# Patient Record
Sex: Female | Born: 1975 | Race: Black or African American | Hispanic: No | Smoking: Never smoker
Health system: Southern US, Community
[De-identification: ages and names within clinical notes are randomized; demographics above are authoritative.]

## PROBLEM LIST (undated history)

## (undated) DIAGNOSIS — E538 Deficiency of other specified B group vitamins: Secondary | ICD-10-CM

## (undated) DIAGNOSIS — E559 Vitamin D deficiency, unspecified: Secondary | ICD-10-CM

## (undated) DIAGNOSIS — G4700 Insomnia, unspecified: Secondary | ICD-10-CM

## (undated) DIAGNOSIS — L4050 Arthropathic psoriasis, unspecified: Secondary | ICD-10-CM

## (undated) DIAGNOSIS — G4730 Sleep apnea, unspecified: Secondary | ICD-10-CM

## (undated) DIAGNOSIS — M329 Systemic lupus erythematosus, unspecified: Secondary | ICD-10-CM

## (undated) DIAGNOSIS — I10 Essential (primary) hypertension: Secondary | ICD-10-CM

## (undated) DIAGNOSIS — E739 Lactose intolerance, unspecified: Secondary | ICD-10-CM

## (undated) DIAGNOSIS — F32A Depression, unspecified: Secondary | ICD-10-CM

## (undated) DIAGNOSIS — G8929 Other chronic pain: Secondary | ICD-10-CM

## (undated) DIAGNOSIS — F988 Other specified behavioral and emotional disorders with onset usually occurring in childhood and adolescence: Secondary | ICD-10-CM

## (undated) DIAGNOSIS — R079 Chest pain, unspecified: Secondary | ICD-10-CM

## (undated) DIAGNOSIS — R011 Cardiac murmur, unspecified: Secondary | ICD-10-CM

## (undated) DIAGNOSIS — K829 Disease of gallbladder, unspecified: Secondary | ICD-10-CM

## (undated) DIAGNOSIS — G4733 Obstructive sleep apnea (adult) (pediatric): Secondary | ICD-10-CM

## (undated) DIAGNOSIS — F329 Major depressive disorder, single episode, unspecified: Secondary | ICD-10-CM

## (undated) DIAGNOSIS — R519 Headache, unspecified: Secondary | ICD-10-CM

## (undated) DIAGNOSIS — M2550 Pain in unspecified joint: Secondary | ICD-10-CM

## (undated) DIAGNOSIS — A4902 Methicillin resistant Staphylococcus aureus infection, unspecified site: Secondary | ICD-10-CM

## (undated) DIAGNOSIS — R51 Headache: Secondary | ICD-10-CM

## (undated) DIAGNOSIS — M797 Fibromyalgia: Secondary | ICD-10-CM

## (undated) HISTORY — DX: Headache, unspecified: R51.9

## (undated) HISTORY — DX: Lactose intolerance, unspecified: E73.9

## (undated) HISTORY — DX: Other chronic pain: G89.29

## (undated) HISTORY — DX: Deficiency of other specified B group vitamins: E53.8

## (undated) HISTORY — DX: Cardiac murmur, unspecified: R01.1

## (undated) HISTORY — DX: Disease of gallbladder, unspecified: K82.9

## (undated) HISTORY — DX: Arthropathic psoriasis, unspecified: L40.50

## (undated) HISTORY — DX: Obstructive sleep apnea (adult) (pediatric): G47.33

## (undated) HISTORY — DX: Other specified behavioral and emotional disorders with onset usually occurring in childhood and adolescence: F98.8

## (undated) HISTORY — DX: Headache: R51

## (undated) HISTORY — DX: Systemic lupus erythematosus, unspecified: M32.9

## (undated) HISTORY — DX: Chest pain, unspecified: R07.9

## (undated) HISTORY — DX: Vitamin D deficiency, unspecified: E55.9

## (undated) HISTORY — DX: Pain in unspecified joint: M25.50

## (undated) HISTORY — DX: Sleep apnea, unspecified: G47.30

---

## 1996-07-01 HISTORY — PX: TUBAL LIGATION: SHX77

## 2000-12-18 ENCOUNTER — Emergency Department (HOSPITAL_COMMUNITY): Admission: EM | Admit: 2000-12-18 | Discharge: 2000-12-18 | Payer: Self-pay | Admitting: Emergency Medicine

## 2001-04-01 ENCOUNTER — Emergency Department (HOSPITAL_COMMUNITY): Admission: EM | Admit: 2001-04-01 | Discharge: 2001-04-01 | Payer: Self-pay | Admitting: Emergency Medicine

## 2001-12-28 ENCOUNTER — Other Ambulatory Visit: Admission: RE | Admit: 2001-12-28 | Discharge: 2001-12-28 | Payer: Self-pay | Admitting: Obstetrics and Gynecology

## 2002-02-15 ENCOUNTER — Encounter: Payer: Self-pay | Admitting: Emergency Medicine

## 2002-02-15 ENCOUNTER — Emergency Department (HOSPITAL_COMMUNITY): Admission: EM | Admit: 2002-02-15 | Discharge: 2002-02-15 | Payer: Self-pay | Admitting: Emergency Medicine

## 2002-04-10 ENCOUNTER — Emergency Department (HOSPITAL_COMMUNITY): Admission: EM | Admit: 2002-04-10 | Discharge: 2002-04-11 | Payer: Self-pay | Admitting: Emergency Medicine

## 2002-08-18 ENCOUNTER — Emergency Department (HOSPITAL_COMMUNITY): Admission: EM | Admit: 2002-08-18 | Discharge: 2002-08-18 | Payer: Self-pay | Admitting: Neurology

## 2002-12-05 ENCOUNTER — Emergency Department (HOSPITAL_COMMUNITY): Admission: EM | Admit: 2002-12-05 | Discharge: 2002-12-05 | Payer: Self-pay | Admitting: Emergency Medicine

## 2003-05-09 ENCOUNTER — Emergency Department (HOSPITAL_COMMUNITY): Admission: EM | Admit: 2003-05-09 | Discharge: 2003-05-09 | Payer: Self-pay | Admitting: Emergency Medicine

## 2003-07-02 HISTORY — PX: APPENDECTOMY: SHX54

## 2003-08-07 ENCOUNTER — Inpatient Hospital Stay (HOSPITAL_COMMUNITY): Admission: EM | Admit: 2003-08-07 | Discharge: 2003-08-09 | Payer: Self-pay | Admitting: Emergency Medicine

## 2003-08-08 ENCOUNTER — Encounter (INDEPENDENT_AMBULATORY_CARE_PROVIDER_SITE_OTHER): Payer: Self-pay | Admitting: *Deleted

## 2003-08-26 ENCOUNTER — Other Ambulatory Visit: Admission: RE | Admit: 2003-08-26 | Discharge: 2003-08-26 | Payer: Self-pay | Admitting: Obstetrics and Gynecology

## 2004-04-30 ENCOUNTER — Other Ambulatory Visit: Admission: RE | Admit: 2004-04-30 | Discharge: 2004-04-30 | Payer: Self-pay | Admitting: Obstetrics and Gynecology

## 2004-12-21 ENCOUNTER — Emergency Department (HOSPITAL_COMMUNITY): Admission: EM | Admit: 2004-12-21 | Discharge: 2004-12-21 | Payer: Self-pay | Admitting: *Deleted

## 2006-02-03 ENCOUNTER — Emergency Department (HOSPITAL_COMMUNITY): Admission: EM | Admit: 2006-02-03 | Discharge: 2006-02-03 | Payer: Self-pay | Admitting: Emergency Medicine

## 2006-05-21 ENCOUNTER — Emergency Department (HOSPITAL_COMMUNITY): Admission: EM | Admit: 2006-05-21 | Discharge: 2006-05-21 | Payer: Self-pay | Admitting: Emergency Medicine

## 2006-06-23 ENCOUNTER — Other Ambulatory Visit: Admission: RE | Admit: 2006-06-23 | Discharge: 2006-06-23 | Payer: Self-pay | Admitting: Internal Medicine

## 2006-09-06 ENCOUNTER — Emergency Department (HOSPITAL_COMMUNITY): Admission: EM | Admit: 2006-09-06 | Discharge: 2006-09-06 | Payer: Self-pay | Admitting: Emergency Medicine

## 2006-09-27 ENCOUNTER — Emergency Department (HOSPITAL_COMMUNITY): Admission: EM | Admit: 2006-09-27 | Discharge: 2006-09-27 | Payer: Self-pay | Admitting: Family Medicine

## 2007-06-26 ENCOUNTER — Emergency Department (HOSPITAL_COMMUNITY): Admission: EM | Admit: 2007-06-26 | Discharge: 2007-06-26 | Payer: Self-pay | Admitting: Emergency Medicine

## 2007-12-04 ENCOUNTER — Ambulatory Visit: Payer: Self-pay | Admitting: *Deleted

## 2007-12-09 ENCOUNTER — Ambulatory Visit: Payer: Self-pay | Admitting: *Deleted

## 2007-12-15 ENCOUNTER — Other Ambulatory Visit: Admission: RE | Admit: 2007-12-15 | Discharge: 2007-12-15 | Payer: Self-pay | Admitting: Obstetrics and Gynecology

## 2007-12-23 ENCOUNTER — Ambulatory Visit: Payer: Self-pay | Admitting: *Deleted

## 2007-12-30 ENCOUNTER — Ambulatory Visit: Payer: Self-pay | Admitting: *Deleted

## 2009-01-27 ENCOUNTER — Emergency Department (HOSPITAL_COMMUNITY): Admission: EM | Admit: 2009-01-27 | Discharge: 2009-01-27 | Payer: Self-pay | Admitting: Emergency Medicine

## 2009-10-04 ENCOUNTER — Emergency Department (HOSPITAL_COMMUNITY): Admission: EM | Admit: 2009-10-04 | Discharge: 2009-10-04 | Payer: Self-pay | Admitting: Emergency Medicine

## 2010-05-07 ENCOUNTER — Emergency Department (HOSPITAL_COMMUNITY): Admission: EM | Admit: 2010-05-07 | Discharge: 2010-05-07 | Payer: Self-pay | Admitting: Emergency Medicine

## 2010-09-11 LAB — CBC
HCT: 37.7 % (ref 36.0–46.0)
MCH: 29.8 pg (ref 26.0–34.0)
MCHC: 33.6 g/dL (ref 30.0–36.0)
MCV: 88.9 fL (ref 78.0–100.0)
Platelets: 341 10*3/uL (ref 150–400)
RDW: 13.3 % (ref 11.5–15.5)
WBC: 10.2 10*3/uL (ref 4.0–10.5)

## 2010-09-11 LAB — DIFFERENTIAL
Basophils Absolute: 0 10*3/uL (ref 0.0–0.1)
Eosinophils Absolute: 0.1 10*3/uL (ref 0.0–0.7)
Eosinophils Relative: 1 % (ref 0–5)
Monocytes Absolute: 0.7 10*3/uL (ref 0.1–1.0)

## 2010-09-11 LAB — PREGNANCY, URINE: Preg Test, Ur: NEGATIVE

## 2010-09-11 LAB — POCT I-STAT, CHEM 8
BUN: 15 mg/dL (ref 6–23)
Calcium, Ion: 1.16 mmol/L (ref 1.12–1.32)
Hemoglobin: 13.9 g/dL (ref 12.0–15.0)
Sodium: 138 mEq/L (ref 135–145)
TCO2: 25 mmol/L (ref 0–100)

## 2010-09-11 LAB — URINE MICROSCOPIC-ADD ON

## 2010-09-11 LAB — URINALYSIS, ROUTINE W REFLEX MICROSCOPIC
Bilirubin Urine: NEGATIVE
Nitrite: NEGATIVE
Specific Gravity, Urine: 1.028 (ref 1.005–1.030)
Urobilinogen, UA: 1 mg/dL (ref 0.0–1.0)
pH: 7 (ref 5.0–8.0)

## 2010-09-11 LAB — POCT CARDIAC MARKERS: Myoglobin, poc: 20.3 ng/mL (ref 12–200)

## 2010-09-19 LAB — POCT CARDIAC MARKERS
CKMB, poc: <1 ng/mL — ABNORMAL LOW (ref 1.0–8.0)
Myoglobin, poc: 48.3 ng/mL (ref 12–200)
Troponin i, poc: <0.05 ng/mL (ref 0.00–0.09)
Troponin i, poc: <0.05 ng/mL (ref 0.00–0.09)

## 2010-09-19 LAB — URINALYSIS, ROUTINE W REFLEX MICROSCOPIC
Bilirubin Urine: NEGATIVE
Glucose, UA: NEGATIVE mg/dL
Ketones, ur: NEGATIVE mg/dL
Nitrite: NEGATIVE
Specific Gravity, Urine: 1.021 (ref 1.005–1.030)
pH: 6.5 (ref 5.0–8.0)

## 2010-09-19 LAB — POCT I-STAT, CHEM 8
BUN: 8 mg/dL (ref 6–23)
Calcium, Ion: 1.1 mmol/L — ABNORMAL LOW (ref 1.12–1.32)
Chloride: 103 mEq/L (ref 96–112)
Glucose, Bld: 93 mg/dL (ref 70–99)
TCO2: 26 mmol/L (ref 0–100)

## 2010-09-19 LAB — URINE MICROSCOPIC-ADD ON

## 2010-09-19 LAB — D-DIMER, QUANTITATIVE: D-Dimer, Quant: 0.24 ug/mL-FEU (ref 0.00–0.48)

## 2010-10-07 LAB — URINALYSIS, ROUTINE W REFLEX MICROSCOPIC
Bilirubin Urine: NEGATIVE
Ketones, ur: NEGATIVE mg/dL
Nitrite: NEGATIVE
Protein, ur: NEGATIVE mg/dL
Urobilinogen, UA: 1 mg/dL (ref 0.0–1.0)

## 2010-10-07 LAB — POCT PREGNANCY, URINE: Preg Test, Ur: NEGATIVE

## 2010-11-16 NOTE — Op Note (Signed)
NAMELORIENE, Sandra Vang                        ACCOUNT NO.:  [id number]   MEDICAL RECORD NO.:  [id number]                   PATIENT TYPE:  INP   LOCATION:  5709                                 FACILITY:  [facility]   PHYSICIAN:  Thornton Park. Daphine Deutscher, M.D.             DATE OF BIRTH:  12-10-1975   DATE OF PROCEDURE:  08/07/2003  DATE OF DISCHARGE:                                 OPERATIVE REPORT   PREOPERATIVE DIAGNOSIS:  Acute appendicitis.   POSTOPERATIVE DIAGNOSIS:  Acute appendicitis in the pelvis with left ovarian  cyst.   OPERATION PERFORMED:   SURGEON:  Molli Hazard B. Daphine Deutscher, M.D.   ANESTHESIA:  General endotracheal.   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  There a diagnosis of acute appendicitis  was made based on her physical findings.  She had an equivocal CT scan.  Informed consent was obtained regarding the procedure and the risks.   DESCRIPTION OF PROCEDURE:  The patient was taken to room 16 at [facility] on Sunday, August 07, 2003 and given general anesthesia.  Unasyn  3 g was started in her arm and she developed a couple of small welts on her  arm.  Subsequently, we ordered some Mefoxin which is pending.  The abdomen  was prepped with Betadine and draped sterilely.  I excised her previous  transverse incision in her infraumbilical region from previous laparoscopic  surgery.  I was able to incise the fascia which had a fairly thin abdominal  wall at that location.  I used a generous pursestring suture going in with a  0 Vicryl which I subsequently used to close the small wound.  The Hasson  cannula was inserted under direct vision without difficulty.  The abdomen  was insufflated and the cecum was very floppy and was lying down in the  pelvis.  I put her in Trendelenburg, rotated her to the left and was able to  find the appendix down in the pelvis where there was some cloudy purulent  looking  fluid lying on top of her uterus.  I examined her ovaries and the  left side looked like it had a cyst that might could be about ready to  rupture and entering ovulation.  The appendix  was grasped and was easily  mobile and brought out of the pelvis and had redness and turgor associated  with early appendicitis.  The base was then dissected free and was  transected with as single application of the Endo GIA using a vascular  cartilage.  The mesentery of the appendix was transected using the Harmonic  scalpel.  The appendix was placed into a bag and brought out through the  umbilicus.  I reinserted the Hasson and injected all the port sites with  Marcaine. We surveyed the pelvis and again saw no evidence of bleeding from  the appendiceal mesentery and  the staple line looked good and clean on the  cecum.  The umbilical port was tied down under laparoscopic vision and the  abdomen reinflated and looked around and no other abnormalities were  identified.  The abdomen was deflated as the trocars were removed.  The skin  was closed with 4-0 Vicryl with benzoin and Steri-Strips.  The patient  seemed to tolerate the procedure well and was taken to the recovery room in  satisfactory condition.                                               Thornton Park Daphine Deutscher, M.D.    MBM/MEDQ  D:  08/07/2003  T:  08/08/2003  Job:  [id number]

## 2010-11-16 NOTE — Discharge Summary (Signed)
[name], Sandra Vang                        ACCOUNT NO.:  [id number]   MEDICAL RECORD NO.:  [id number]                   PATIENT TYPE:  INP   LOCATION:  5709                                 FACILITY:  [facility]   PHYSICIAN:  Thornton Park. Daphine Deutscher, M.D.             DATE OF BIRTH:  [birth year]   DATE OF ADMISSION:  08/07/2003  DATE OF DISCHARGE:  08/09/2003                                 DISCHARGE SUMMARY   ADMISSION DIAGNOSIS:  Acute appendicitis.   PROCEDURE:  Laparoscopic appendectomy.   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   HOSPITAL COURSE:  She underwent a laparoscopic appendectomy on the afternoon  of August 07, 2003. She was not ready to go home on the afternoon of  August 08, 2003, and was kept overnight until August 09, 2003.   At that time her white count had normalized. She was feeling better, passing  gas and ready to go home. She was discharged to return to the office in 2  weeks. Forms were filled out for her employer which is [employer].   CONDITION ON DISCHARGE:  Good.                                                Thornton Park Daphine Deutscher, M.D.    MBM/MEDQ  D:  08/09/2003  T:  08/09/2003  Job:  [id number]

## 2010-11-16 NOTE — H&P (Signed)
Sandra Vang, Sandra Vang                        ACCOUNT NO.:  [id number]   MEDICAL RECORD NO.:  [id number]                   PATIENT TYPE:  INP   LOCATION:  5709                                 FACILITY:  [facility]   PHYSICIAN:  Thornton Park. Daphine Deutscher, M.D.             DATE OF BIRTH:  Feb 01, 1976   DATE OF ADMISSION:  08/07/2003  DATE OF DISCHARGE:                                HISTORY & PHYSICAL   CHIEF COMPLAINT:  Lower abdominal pain since early this morning.   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  This was severe and somewhat was associated with nausea  and subsequent vomiting.  She was brought to the emergency room, where she  was checked in around 4:20, and began workup by Dr. __________ .  She denied  any diarrhea or ever having any pain like this in the past.  Her last  menstrual period was in the latter part of January and was normal.  She sees  an OB/GYN who treated her for some vaginosis with some Flagyl gel but no  antibiotics.   ALLERGIES:  The patient has allergies to IV CONTRAST MATERIAL, which  produces hives, and she says CODEINE produces some itching.   MEDICINES:  Medicines include previously used Zyrtec and she has used the  Flagyl gel.   PAST MEDICAL HISTORY:  She has had two prior C-sections.  She has also had a  right kidney cyst for which she sees Dr. Dennison Nancy. Kimbrough.   HABITS:  She is a nonsmoker, nondrinker, non-drug user.   FAMILY HISTORY:  Family history is noncontributory.   REVIEW OF SYSTEMS:  Review of systems negative for pulmonary, cardiac,  previous GI, immune problems or bleeding dyscrasias.   SOCIAL HISTORY:  The patient is a Engineer, civil (consulting) at [employer].   PHYSICAL EXAMINATION:  VITAL SIGNS:  Blood pressure 109/59, pulse 78,  respirations 16.  HEENT:  Head:  Normocephalic.  Eyes:  Sclerae nonicteric.  Pupils equal,  round and reactive to light.  Nose and throat:  Throat has got dry mucous  membranes and chronic tonsillitis.  NECK:  No thyromegaly or adenopathy.  CHEST:  Chest is clear to auscultation.  HEART:  Sinus rhythm without murmurs or gallops noted.  ABDOMEN:  There are scant bowel sounds but she is tender on the right more  than left with guarding.  She has exquisite pain with coughing and does not  do this.  She has got some element of rebound tenderness.  EXTREMITY EXAM:  Full range of motion.   LABORATORY DATA:  Laboratory drawn at 5 o'clock this morning showed white  count of 10,300 with 85% neutrophils and absolute granulocytes elevated at  8.7%.  BMET is  unremarkable.  Hemoglobin is 13.6 with normal electrolytes.  Urinalysis showed a few white cells and this may have been a clean-catch  urine.   ASSESSMENT:  We discussed various approaches to managing this.  I did review  the CT scan with Dr. Rolan Bucco L. [place] and because there is no intravenous  contrast and because the oral contrast only got into the cecum, some parts  of the pelvic exam are not visualized as well as if we were able to give her  intravenous contrast.  However, we did not see any evidence of free fluid.  In the area of her appendix, there is no appendix seen.  He is not able to  rule out appendicitis on the CT scan.   Because of her tenderness, we discussed options including admission into  observation, or a laparoscopy with appendectomy and a pelvic examination.  Because of her tenderness and her physical findings that would point more  toward appendicitis, she probably wants to pursue appendectomy and I feel  like we will proceed with laparoscopic appendectomy.   PLAN:  Laparoscopic appendectomy.  We will request 3 g of Unasyn IV.  Permit  was obtained by me in the ED including risks, benefits, potential  complications.  Plan to OR when OR  available.                                                Thornton Park Daphine Deutscher, M.D.    MBM/MEDQ  D:  08/07/2003  T:  08/08/2003  Job:  [id number]

## 2010-12-08 ENCOUNTER — Inpatient Hospital Stay (INDEPENDENT_AMBULATORY_CARE_PROVIDER_SITE_OTHER)
Admission: RE | Admit: 2010-12-08 | Discharge: 2010-12-08 | Disposition: A | Discharge status: Home / Self Care | Payer: Managed Care, Other (non HMO) | Source: Ambulatory Visit | Attending: Emergency Medicine | Admitting: Emergency Medicine

## 2010-12-08 DIAGNOSIS — L0231 Cutaneous abscess of buttock: Secondary | ICD-10-CM

## 2010-12-11 LAB — CULTURE, ROUTINE-ABSCESS

## 2011-04-05 LAB — POCT URINALYSIS DIP (DEVICE)
Nitrite: POSITIVE — AB
Protein, ur: >=300 — AB
pH: 5.5

## 2011-04-05 LAB — POCT PREGNANCY, URINE: Preg Test, Ur: NEGATIVE

## 2011-07-09 ENCOUNTER — Emergency Department (HOSPITAL_COMMUNITY)
Admission: EM | Admit: 2011-07-09 | Discharge: 2011-07-10 | Disposition: A | Discharge status: Home / Self Care | Payer: Managed Care, Other (non HMO) | Attending: Emergency Medicine | Admitting: Emergency Medicine

## 2011-07-09 ENCOUNTER — Other Ambulatory Visit: Payer: Self-pay

## 2011-07-09 ENCOUNTER — Encounter: Payer: Self-pay | Admitting: Emergency Medicine

## 2011-07-09 DIAGNOSIS — R5381 Other malaise: Secondary | ICD-10-CM | POA: Insufficient documentation

## 2011-07-09 DIAGNOSIS — R5383 Other fatigue: Secondary | ICD-10-CM

## 2011-07-09 DIAGNOSIS — F3289 Other specified depressive episodes: Secondary | ICD-10-CM | POA: Insufficient documentation

## 2011-07-09 DIAGNOSIS — F329 Major depressive disorder, single episode, unspecified: Secondary | ICD-10-CM | POA: Insufficient documentation

## 2011-07-09 DIAGNOSIS — Z79899 Other long term (current) drug therapy: Secondary | ICD-10-CM | POA: Insufficient documentation

## 2011-07-09 DIAGNOSIS — M7989 Other specified soft tissue disorders: Secondary | ICD-10-CM | POA: Insufficient documentation

## 2011-07-09 HISTORY — DX: Insomnia, unspecified: G47.00

## 2011-07-09 HISTORY — DX: Fibromyalgia: M79.7

## 2011-07-09 HISTORY — DX: Depression, unspecified: F32.A

## 2011-07-09 HISTORY — DX: Major depressive disorder, single episode, unspecified: F32.9

## 2011-07-09 NOTE — ED Notes (Signed)
Pt alert, nad, c/o swelling to feet and ankle, high b/p per [facility], pt resp even unlabored skin pwd, c/o feeling tired

## 2011-07-10 LAB — URINALYSIS, ROUTINE W REFLEX MICROSCOPIC
Bilirubin Urine: NEGATIVE
Hgb urine dipstick: NEGATIVE
Ketones, ur: NEGATIVE mg/dL
Specific Gravity, Urine: 1.018 (ref 1.005–1.030)
Urobilinogen, UA: 0.2 mg/dL (ref 0.0–1.0)
pH: 6 (ref 5.0–8.0)

## 2011-07-10 LAB — URINE CULTURE
Colony Count: 85000
Culture  Setup Time: 201301090908

## 2011-07-10 LAB — POCT I-STAT, CHEM 8
Calcium, Ion: 1.11 mmol/L — ABNORMAL LOW (ref 1.12–1.32)
Chloride: 104 mEq/L (ref 96–112)
HCT: 39 % (ref 36.0–46.0)
Hemoglobin: 13.3 g/dL (ref 12.0–15.0)
TCO2: 24 mmol/L (ref 0–100)

## 2011-07-10 LAB — URINE MICROSCOPIC-ADD ON

## 2011-07-10 NOTE — ED Provider Notes (Signed)
History     CSN: [id number]  Arrival date & time 07/09/11  2238   First MD Initiated Contact with Patient 07/10/11 0234      Chief Complaint  Patient presents with  . Facial Swelling    also to ankles, feet and hands.  . Hypertension    ton[facility] 147/[facility]    (Consider location/radiation/quality/duration/timing/severity/associated sxs/prior treatment) HPI Comments: 36 year old female with a history of fibromyalgia presents with approximately 5 days of swelling of the legs specifically the ankles and feet as well as mild swelling of the face. This is the first time she has ever had this, it is mild and has improved over the last 4 days. She denies urinary symptoms, cough fever chills nausea vomiting abdominal pain chest pain sore throat nasal congestion or headache. There is no focal weakness but the patient does feel increasing fatigue over the last several days. She denies being pregnant has no dysuria rash diarrhea or fevers. Symptoms are now mild, persistent, gradually improving. He has not started any new medications recently  Patient is a 36 y.o. female presenting with hypertension. The history is provided by the patient.  Hypertension    Past Medical History  Diagnosis Date  . Fibromyalgia   . Depression   . Insomnia     Past Surgical History  Procedure Date  . Appendectomy   . Cesarean section   . Tubal ligation     No family history on file.  History  Substance Use Topics  . Smoking status: Not on file  . Smokeless tobacco: Not on file  . Alcohol Use:     OB History    Grav Para Term Preterm Abortions TAB SAB Ect Mult Living                  Review of Systems  All other systems reviewed and are negative.    Allergies  Iohexol and Sulfa antibiotics  Home Medications   Current Outpatient Rx  Name Route Sig Dispense Refill  . CITALOPRAM HYDROBROMIDE 20 MG PO TABS Oral Take 20 mg by mouth daily.      [name] ZOLPIDEM TARTRATE 10 MG PO TABS Oral Take  10 mg by mouth at bedtime as needed.        BP 148/85  Pulse 70  Temp 98 F (36.7 C)  Resp 16  Wt 80 lb (36.288 kg)  SpO2 99%  LMP 07/02/2011  Physical Exam  Nursing note and vitals reviewed. Constitutional: She appears well-developed and well-nourished. No distress.  HENT:  Head: Normocephalic and atraumatic.  Mouth/Throat: Oropharynx is clear and moist. No oropharyngeal exudate.  Eyes: Conjunctivae and EOM are normal. Pupils are equal, round, and reactive to light. Right eye exhibits no discharge. Left eye exhibits no discharge. No scleral icterus.  Neck: Normal range of motion. Neck supple. No JVD present. No thyromegaly present.  Cardiovascular: Normal rate, regular rhythm, normal heart sounds and intact distal pulses.  Exam reveals no gallop and no friction rub.   No murmur heard. Pulmonary/Chest: Effort normal and breath sounds normal. No respiratory distress. She has no wheezes. She has no rales.  Abdominal: Soft. Bowel sounds are normal. She exhibits no distension and no mass. There is no tenderness.  Musculoskeletal: Normal range of motion. She exhibits no tenderness. Edema:  Scant bilateral lower extremity edema at the ankles. Normal pulses at the feet.  Lymphadenopathy:    She has no cervical adenopathy.  Neurological: She is alert. Coordination normal.  Skin:  Skin is warm and dry. No rash noted. No erythema.  Psychiatric: She has a normal mood and affect. Her behavior is normal.    ED Course  Procedures (including critical care time)  Labs Reviewed  URINALYSIS, ROUTINE W REFLEX MICROSCOPIC - Abnormal; Notable for the following:    APPearance CLOUDY (*)    Leukocytes, UA MODERATE (*)    All other components within normal limits  POCT I-STAT, CHEM 8 - Abnormal; Notable for the following:    Calcium, Ion 1.11 (*)    All other components within normal limits  URINE MICROSCOPIC-ADD ON - Abnormal; Notable for the following:    Squamous Epithelial / LPF MANY (*)     Bacteria, UA FEW (*)    All other components within normal limits  PREGNANCY, URINE  POCT I-STAT TROPONIN I  I-STAT TROPONIN I  I-STAT, CHEM 8  URINE CULTURE   No results found.   1. Swelling of lower limb   2. Fatigue       MDM  Patient appears well, no appreciable facial swelling, scant lower extremity edema. She states that there is a component of worsening today better at night when her feet are up. We'll check urinalysis for proteinuria, chemistry for hypokalemia as a possible source of her generalized fatigue. There is no focal neurologic symptoms on my exam or signs of neurologic deficit. EKG is normal showing no abnormalities as ordered by the nurse in triage. Patient declines fluids when offered.  ED ECG REPORT   Date: 07/10/2011   Rate: 64  Rhythm: normal sinus rhythm  QRS Axis: normal  Intervals: normal  ST/T Wave abnormalities: normal  Conduction Disutrbances:none  Narrative Interpretation:   Old EKG Reviewed: Unchanged compared to 05/07/2010   Lab results reviewed and shows no anemia, urinalysis with many squamous cells but no white blood cells and few bacteria, urine culture sent. Urine pregnancy negative, troponin negative and EKG unremarkable. Will discharge patient for close followup. There is no obvious abnormality seen on today's physical exam nor laboratory workup. She does not have proteinuria and has a average specific gravity suggesting no significant dehydration.      Vida Roller, MD 07/10/11 [phone]

## 2011-11-04 ENCOUNTER — Ambulatory Visit (INDEPENDENT_AMBULATORY_CARE_PROVIDER_SITE_OTHER): Payer: Managed Care, Other (non HMO) | Admitting: Family

## 2011-11-04 ENCOUNTER — Encounter: Payer: Self-pay | Admitting: Family

## 2011-11-04 VITALS — BP 100/80 | Ht 61.0 in | Wt 182.0 lb

## 2011-11-04 DIAGNOSIS — R609 Edema, unspecified: Secondary | ICD-10-CM

## 2011-11-04 DIAGNOSIS — E669 Obesity, unspecified: Secondary | ICD-10-CM

## 2011-11-04 DIAGNOSIS — Z0000 Encounter for general adult medical examination without abnormal findings: Secondary | ICD-10-CM

## 2011-11-04 LAB — CBC WITH DIFFERENTIAL/PLATELET
Basophils Relative: 0.3 % (ref 0.0–3.0)
Eosinophils Absolute: 0 10*3/uL (ref 0.0–0.7)
Eosinophils Relative: 0.6 % (ref 0.0–5.0)
Lymphocytes Relative: 24.2 % (ref 12.0–46.0)
MCHC: 32.6 g/dL (ref 30.0–36.0)
Neutrophils Relative %: 69 % (ref 43.0–77.0)
Platelets: 318 10*3/uL (ref 150.0–400.0)
RBC: 4.23 Mil/uL (ref 3.87–5.11)
WBC: 7.8 10*3/uL (ref 4.5–10.5)

## 2011-11-04 LAB — BASIC METABOLIC PANEL
Calcium: 8.9 mg/dL (ref 8.4–10.5)
GFR: 134.96 mL/min (ref >60.00)
Sodium: 140 mEq/L (ref 135–145)

## 2011-11-04 LAB — LIPID PANEL
Cholesterol: 178 mg/dL (ref 0–200)
HDL: 76.3 mg/dL (ref >39.00)
Total CHOL/HDL Ratio: 2
Triglycerides: 58 mg/dL (ref 0.0–149.0)

## 2011-11-04 LAB — POCT URINALYSIS DIPSTICK
Bilirubin, UA: NEGATIVE
Blood, UA: NEGATIVE
Nitrite, UA: NEGATIVE
Spec Grav, UA: 1.02
pH, UA: 7

## 2011-11-04 MED ORDER — FUROSEMIDE 20 MG PO TABS: 20.0000 mg | ORAL_TABLET | Freq: Every day | ORAL | Status: DC

## 2011-11-04 NOTE — Progress Notes (Signed)
Subjective:    Patient ID: Sandra Vang, female    DOB: Jun 09, 1976, 36 y.o.   MRN: [id number]  HPI This is a routine physical examination for this healthy  Female. Reviewed all health maintenance protocols including reviewed appropriate screening labs. Her immunization history was reviewed as well as her current medications and allergies refills of her chronic medications were given and the plan for yearly health maintenance was discussed all orders and referrals were made as appropriate.  Patient has concerns of edema to both lower extremities that occurs periodically. She was seen at the emergency department for evaluation of his several months ago, was given Lasix and improved her symptoms. She never found out the etiology of her edema. She denies any lightheadedness, dizziness, chest pain, palpitations, shortness of breath or edema.  Review of Systems  Constitutional: Negative.   HENT: Negative.   Eyes: Negative.   Respiratory: Negative.   Cardiovascular: Positive for leg swelling. Negative for chest pain and palpitations.  Gastrointestinal: Negative.   Genitourinary: Negative.   Musculoskeletal: Negative.   Skin: Negative.   Neurological: Negative.   Hematological: Negative.   Psychiatric/Behavioral: Negative.    Past Medical History  Diagnosis Date  . Fibromyalgia   . Depression   . Insomnia     History   Social History  . Marital Status: Married    Spouse Name: N/A    Number of Children: N/A  . Years of Education: N/A   Occupational History  . Not on file.   Social History Main Topics  . Smoking status: Never Smoker   . Smokeless tobacco: Not on file  . Alcohol Use: No  . Drug Use: No  . Sexually Active: Not on file   Other Topics Concern  . Not on file   Social History Narrative  . No narrative on file    Past Surgical History  Procedure Date  . Appendectomy   . Cesarean section   . Tubal ligation     Family History  Problem Relation Age  of Onset  . Alcohol abuse Mother   . Hypertension Mother   . Drug abuse Father   . Hypertension Father   . Depression Paternal Aunt   . Depression Paternal Uncle   . Alcohol abuse Maternal Grandmother   . Arthritis Maternal Grandmother   . Cancer Maternal Grandmother   . Hypertension Maternal Grandmother   . Alcohol abuse Maternal Grandfather   . Hypertension Maternal Grandfather   . Arthritis Paternal Grandmother   . Cancer Paternal Grandmother   . Hypertension Paternal Grandmother   . Hypertension Paternal Grandfather     Allergies  Allergen Reactions  . Iohexol      Desc: HIVES   . Sulfa Antibiotics     Current Outpatient Prescriptions on File Prior to Visit  Medication Sig Dispense Refill  . citalopram (CELEXA) 20 MG tablet Take 20 mg by mouth daily.        [name] zolpidem (AMBIEN) 10 MG tablet Take 10 mg by mouth at bedtime as needed.        . furosemide (LASIX) 20 MG tablet Take 1 tablet (20 mg total) by mouth daily.  30 tablet  0    BP 100/80  Ht 5\' 1"  (1.549 m)  Wt 182 lb (82.555 kg)  BMI 34.39 kg/m2  LMP 04/01/2013chart    Objective:   Physical Exam  Constitutional: She is oriented to person, place, and time. She appears well-developed and well-nourished.  HENT:  Head: Normocephalic  and atraumatic.  Right Ear: External ear normal.  Left Ear: External ear normal.  Nose: Nose normal.  Mouth/Throat: Oropharynx is clear and moist.  Eyes: Conjunctivae and EOM are normal. Pupils are equal, round, and reactive to light.  Neck: Normal range of motion. Neck supple.  Cardiovascular: Normal rate, regular rhythm and normal heart sounds.   Pulmonary/Chest: Effort normal and breath sounds normal.  Abdominal: Soft. Bowel sounds are normal.  Genitourinary:       Deferred to GYN  Musculoskeletal: Normal range of motion.  Neurological: She is alert and oriented to person, place, and time. She has normal reflexes.  Skin: Skin is warm and dry.  Psychiatric: She has a normal  mood and affect.          Assessment & Plan:  Assessment: Complete physical exam, obesity, edema-peripheral likely related to sodium intake  Plan: Lab sent to include BMP, CBC, lipids, TSH notify patient pending results. Patient's Pap smear is up-to-date, reports having that done at a health screening at work Lasix 20 mg once a day #30 with no refills. We'll followup with patient in about a week and sooner when necessary.

## 2011-11-04 NOTE — Patient Instructions (Signed)
Peripheral Edema You have swelling in your legs (peripheral edema). This swelling is due to excess accumulation of salt and water in your body. Edema may be a sign of heart, kidney or liver disease, or a side effect of a medication. It may also be due to problems in the leg veins. Elevating your legs and using special support stockings may be very helpful, if the cause of the swelling is due to poor venous circulation. Avoid long periods of standing, whatever the cause. Treatment of edema depends on identifying the cause. Chips, pretzels, pickles and other salty foods should be avoided. Restricting salt in your diet is almost always needed. Water pills (diuretics) are often used to remove the excess salt and water from your body via urine. These medicines prevent the kidney from reabsorbing sodium. This increases urine flow. Diuretic treatment may also result in lowering of potassium levels in your body. Potassium supplements may be needed if you have to use diuretics daily. Daily weights can help you keep track of your progress in clearing your edema. You should call your caregiver for follow up care as recommended. SEEK IMMEDIATE MEDICAL CARE IF:   You have increased swelling, pain, redness, or heat in your legs.   You develop shortness of breath, especially when lying down.   You develop chest or abdominal pain, weakness, or fainting.   You have a fever.  Document Released: 07/25/2004 Document Revised: 06/06/2011 Document Reviewed: 07/05/2009 [employer] Patient Information 2012 [employer]. 

## 2011-11-05 NOTE — Progress Notes (Signed)
Quick Note:  Pt aware labs normal ______ 

## 2011-11-20 ENCOUNTER — Encounter: Payer: Self-pay | Admitting: Family

## 2011-11-20 ENCOUNTER — Ambulatory Visit (INDEPENDENT_AMBULATORY_CARE_PROVIDER_SITE_OTHER): Payer: Managed Care, Other (non HMO) | Admitting: Family

## 2011-11-20 VITALS — BP 140/100 | Temp 98.1°F | Wt 182.0 lb

## 2011-11-20 DIAGNOSIS — I10 Essential (primary) hypertension: Secondary | ICD-10-CM

## 2011-11-20 DIAGNOSIS — E669 Obesity, unspecified: Secondary | ICD-10-CM

## 2011-11-20 MED ORDER — LISINOPRIL 10 MG PO TABS: 10.0000 mg | ORAL_TABLET | Freq: Every day | ORAL | Status: DC

## 2011-11-20 NOTE — Patient Instructions (Signed)

## 2011-11-20 NOTE — Progress Notes (Signed)
  Subjective:    Patient ID: Sandra Vang, female    DOB: 08-16-1975, 36 y.o.   MRN: [id number]  HPI 36 year old [place] American female, nonsmoker, presents today with concerns about recent weight gain and elevated blood pressure. 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 Over the past month she has made lifestyle changes including eating more lean proteins, fruits, and vegetables. She has also increased her level of exercise going to the gym 3 times a week and performs both cardio and weight training exercises. Her blood pressure has also been elevated recently and she is concerned due to her family history of cardiac disease (mother and father).    Review of Systems  Constitutional: Positive for unexpected weight change (gain of about 20 lbs in the past 6 months). Negative for appetite change and fatigue.  HENT: Negative.   Eyes: Negative.   Respiratory: Negative.   Cardiovascular: Negative.   Gastrointestinal: Negative.   Genitourinary: Negative.   Musculoskeletal: Negative.   Skin: Negative.   Neurological: Negative.   Hematological: Negative.   Psychiatric/Behavioral: Negative.        Objective:   Physical Exam  Constitutional: She is oriented to person, place, and time. She appears well-developed and well-nourished.  Cardiovascular: Normal rate, regular rhythm and normal heart sounds.   Pulmonary/Chest: Effort normal and breath sounds normal.  Abdominal: Soft.  Neurological: She is alert and oriented to person, place, and time.  Skin: Skin is warm and dry.  Psychiatric: She has a normal mood and affect. Her behavior is normal.          Assessment & Plan:  Assessment: Obesity, Elevated Blood Pressure  Plan: Start Lisinopril 10 mg daily for blood pressure. Encouraged her to continue with her lifestyle changes. Will return in about 3 weeks for recheck of blood pressure and as needed.

## 2011-12-11 ENCOUNTER — Ambulatory Visit: Payer: Managed Care, Other (non HMO) | Admitting: Family

## 2011-12-20 ENCOUNTER — Ambulatory Visit (INDEPENDENT_AMBULATORY_CARE_PROVIDER_SITE_OTHER): Payer: Managed Care, Other (non HMO) | Admitting: Family

## 2011-12-20 ENCOUNTER — Encounter: Payer: Self-pay | Admitting: Family

## 2011-12-20 VITALS — BP 118/80 | Temp 98.4°F | Wt 185.0 lb

## 2011-12-20 DIAGNOSIS — I10 Essential (primary) hypertension: Secondary | ICD-10-CM

## 2011-12-20 DIAGNOSIS — E669 Obesity, unspecified: Secondary | ICD-10-CM

## 2011-12-20 MED ORDER — PHENTERMINE HCL 37.5 MG PO TABS: 37.5000 mg | ORAL_TABLET | Freq: Every day | ORAL | Status: DC

## 2011-12-20 NOTE — Progress Notes (Signed)
Subjective:    Patient ID: Sandra Vang, female    DOB: 1976-01-18, 36 y.o.   MRN: [id number]  HPI 36 year old [place] American female, nonsmoker is in for recheck of hypertension and obesity. She's currently taking lisinopril 10 mg once daily and tolerating it well. She's had a 3 pound weight gain since her last office visit. She reports a decrease in exercise since that time and is increase to caloric consumption. However, she is planning to change that now. She denies any lightheadedness, dizziness, chest pain, palpitations, shortness of breath or edema.   Review of Systems  Constitutional: Negative.   HENT: Negative.   Respiratory: Negative.   Cardiovascular: Negative.   Gastrointestinal: Negative.   Musculoskeletal: Negative.   Skin: Negative.   Neurological: Negative.   Hematological: Negative.   Psychiatric/Behavioral: Negative.    Past Medical History  Diagnosis Date  . Fibromyalgia   . Depression   . Insomnia     History   Social History  . Marital Status: Married    Spouse Name: N/A    Number of Children: N/A  . Years of Education: N/A   Occupational History  . Not on file.   Social History Main Topics  . Smoking status: Never Smoker   . Smokeless tobacco: Not on file  . Alcohol Use: No  . Drug Use: No  . Sexually Active: Not on file   Other Topics Concern  . Not on file   Social History Narrative  . No narrative on file    Past Surgical History  Procedure Date  . Appendectomy   . Cesarean section   . Tubal ligation     Family History  Problem Relation Age of Onset  . Alcohol abuse Mother   . Hypertension Mother   . Drug abuse Father   . Hypertension Father   . Depression Paternal Aunt   . Depression Paternal Uncle   . Alcohol abuse Maternal Grandmother   . Arthritis Maternal Grandmother   . Cancer Maternal Grandmother   . Hypertension Maternal Grandmother   . Alcohol abuse Maternal Grandfather   . Hypertension Maternal  Grandfather   . Arthritis Paternal Grandmother   . Cancer Paternal Grandmother   . Hypertension Paternal Grandmother   . Hypertension Paternal Grandfather     Allergies  Allergen Reactions  . Iohexol      Desc: HIVES   . Sulfa Antibiotics     Current Outpatient Prescriptions on File Prior to Visit  Medication Sig Dispense Refill  . citalopram (CELEXA) 20 MG tablet Take 20 mg by mouth daily.        [name] lisinopril (PRINIVIL,ZESTRIL) 10 MG tablet Take 1 tablet (10 mg total) by mouth daily.  30 tablet  3  . zolpidem (AMBIEN) 10 MG tablet Take 10 mg by mouth at bedtime as needed.        . phentermine (ADIPEX-P) 37.5 MG tablet Take 1 tablet (37.5 mg total) by mouth daily before breakfast.  30 tablet  0    BP 118/80  Temp 98.4 F (36.9 C) (Oral)  Wt 185 lb (83.915 kg)chart    Objective:   Physical Exam  Constitutional: She is oriented to person, place, and time. She appears well-developed and well-nourished.  HENT:  Right Ear: External ear normal.  Left Ear: External ear normal.  Nose: Nose normal.  Mouth/Throat: Oropharynx is clear and moist.  Neck: Normal range of motion. Neck supple.  Cardiovascular: Normal rate, regular rhythm and normal heart sounds.  Pulmonary/Chest: Effort normal and breath sounds normal.  Musculoskeletal: Normal range of motion.  Neurological: She is alert and oriented to person, place, and time.  Skin: Skin is warm and dry.  Psychiatric: She has a normal mood and affect.          Assessment & Plan:  Assessment: Hypertension, obesity  Plan: Strongly encouraged healthy diet and exercise. We'll start Adipex 37.5 mg once daily. Patient is aware that if she does not lose weight we will not continue to prescribe Adipex. I will bring her back for recheck in one month and determine her risk benefit ratio at that time with regard to the medication. Continue lisinopril.

## 2011-12-20 NOTE — Patient Instructions (Signed)

## 2012-01-17 ENCOUNTER — Ambulatory Visit: Payer: Managed Care, Other (non HMO) | Admitting: Family

## 2012-01-17 DIAGNOSIS — Z0289 Encounter for other administrative examinations: Secondary | ICD-10-CM

## 2012-01-27 ENCOUNTER — Encounter: Payer: Self-pay | Admitting: Family

## 2012-01-27 ENCOUNTER — Ambulatory Visit (INDEPENDENT_AMBULATORY_CARE_PROVIDER_SITE_OTHER): Payer: Managed Care, Other (non HMO) | Admitting: Family

## 2012-01-27 VITALS — BP 118/80 | HR 111 | Temp 99.3°F | Wt 180.0 lb

## 2012-01-27 DIAGNOSIS — N3090 Cystitis, unspecified without hematuria: Secondary | ICD-10-CM

## 2012-01-27 DIAGNOSIS — N390 Urinary tract infection, site not specified: Secondary | ICD-10-CM

## 2012-01-27 LAB — POCT URINALYSIS DIPSTICK
Bilirubin, UA: NEGATIVE
Glucose, UA: NEGATIVE
Ketones, UA: NEGATIVE
Spec Grav, UA: >=1.03
Urobilinogen, UA: 0.2

## 2012-01-27 MED ORDER — CIPROFLOXACIN HCL 250 MG PO TABS: 250.0000 mg | ORAL_TABLET | Freq: Two times a day (BID) | ORAL | Status: AC

## 2012-01-27 NOTE — Patient Instructions (Signed)

## 2012-01-27 NOTE — Progress Notes (Signed)
Subjective:    Patient ID: Sandra Vang, female    DOB: May 06, 1976, 36 y.o.   MRN: [id number]  HPI 36 year old African American female, nonsmoker, is in with complaints of urinary frequency, urgency, burning with urination, blood in her urine. She has not taken any medication for relief. She is sexually active with one partner. Denies any concerns any sexually transmitted diseases.   Review of Systems  Constitutional: Negative.   Respiratory: Negative.   Cardiovascular: Negative.   Gastrointestinal: Positive for abdominal pain.  Genitourinary: Positive for dysuria, urgency and frequency.  Psychiatric/Behavioral: Negative.    Past Medical History  Diagnosis Date  . Fibromyalgia   . Depression   . Insomnia     History   Social History  . Marital Status: Married    Spouse Name: N/A    Number of Children: N/A  . Years of Education: N/A   Occupational History  . Not on file.   Social History Main Topics  . Smoking status: Never Smoker   . Smokeless tobacco: Not on file  . Alcohol Use: No  . Drug Use: No  . Sexually Active: Not on file   Other Topics Concern  . Not on file   Social History Narrative  . No narrative on file    Past Surgical History  Procedure Date  . Appendectomy   . Cesarean section   . Tubal ligation     Family History  Problem Relation Age of Onset  . Alcohol abuse Mother   . Hypertension Mother   . Drug abuse Father   . Hypertension Father   . Depression Paternal Aunt   . Depression Paternal Uncle   . Alcohol abuse Maternal Grandmother   . Arthritis Maternal Grandmother   . Cancer Maternal Grandmother   . Hypertension Maternal Grandmother   . Alcohol abuse Maternal Grandfather   . Hypertension Maternal Grandfather   . Arthritis Paternal Grandmother   . Cancer Paternal Grandmother   . Hypertension Paternal Grandmother   . Hypertension Paternal Grandfather     Allergies  Allergen Reactions  . Iohexol      Desc: HIVES   . Sulfa Antibiotics     Current Outpatient Prescriptions on File Prior to Visit  Medication Sig Dispense Refill  . lisinopril (PRINIVIL,ZESTRIL) 10 MG tablet Take 1 tablet (10 mg total) by mouth daily.  30 tablet  3  . citalopram (CELEXA) 20 MG tablet Take 20 mg by mouth daily.        . phentermine (ADIPEX-P) 37.5 MG tablet Take 1 tablet (37.5 mg total) by mouth daily before breakfast.  30 tablet  0  . zolpidem (AMBIEN) 10 MG tablet Take 10 mg by mouth at bedtime as needed.          BP 118/80  Pulse 111  Temp 99.3 F (37.4 C) (Oral)  Wt 180 lb (81.647 kg)  SpO2 98%chart    Objective:   Physical Exam  Constitutional: She is oriented to person, place, and time. She appears well-developed and well-nourished.  Cardiovascular: Normal rate, regular rhythm and normal heart sounds.   Pulmonary/Chest: Effort normal and breath sounds normal.  Abdominal: Soft. Bowel sounds are normal.  Neurological: She is alert and oriented to person, place, and time.  Skin: Skin is warm and dry.  Psychiatric: She has a normal mood and affect.          Assessment & Plan:  Assessment: Urinary tract infection, dysuria, urinary frequency  Plan: Cipro 500 mg one  tablet twice a day x5 days. Increase intake of water. Avoid caffeine. Call the office if symptoms worsen or persist.

## 2012-03-18 ENCOUNTER — Other Ambulatory Visit: Payer: Self-pay

## 2012-03-18 MED ORDER — LISINOPRIL 10 MG PO TABS: 10.0000 mg | ORAL_TABLET | Freq: Every day | ORAL | Status: DC

## 2012-04-12 ENCOUNTER — Encounter (HOSPITAL_COMMUNITY): Payer: Self-pay | Admitting: *Deleted

## 2012-04-12 ENCOUNTER — Emergency Department (INDEPENDENT_AMBULATORY_CARE_PROVIDER_SITE_OTHER)
Admission: EM | Admit: 2012-04-12 | Discharge: 2012-04-12 | Disposition: A | Discharge status: Home / Self Care | Payer: Managed Care, Other (non HMO) | Source: Home / Self Care

## 2012-04-12 DIAGNOSIS — H9209 Otalgia, unspecified ear: Secondary | ICD-10-CM

## 2012-04-12 DIAGNOSIS — K089 Disorder of teeth and supporting structures, unspecified: Secondary | ICD-10-CM

## 2012-04-12 DIAGNOSIS — R609 Edema, unspecified: Secondary | ICD-10-CM

## 2012-04-12 DIAGNOSIS — K047 Periapical abscess without sinus: Secondary | ICD-10-CM

## 2012-04-12 DIAGNOSIS — K0889 Other specified disorders of teeth and supporting structures: Secondary | ICD-10-CM

## 2012-04-12 HISTORY — DX: Methicillin resistant Staphylococcus aureus infection, unspecified site: A49.02

## 2012-04-12 HISTORY — DX: Essential (primary) hypertension: I10

## 2012-04-12 MED ORDER — TRAMADOL HCL 50 MG PO TABS: 50.0000 mg | ORAL_TABLET | Freq: Four times a day (QID) | ORAL | Status: DC | PRN

## 2012-04-12 MED ORDER — IBUPROFEN 600 MG PO TABS: 600.0000 mg | ORAL_TABLET | Freq: Four times a day (QID) | ORAL | Status: DC | PRN

## 2012-04-12 MED ORDER — LIDOCAINE VISCOUS 2 % MT SOLN
20.0000 mL | Freq: Once | OROMUCOSAL | Status: AC
  Administered 2012-04-12: 20 mL via OROMUCOSAL

## 2012-04-12 MED ORDER — FLUCONAZOLE 150 MG PO TABS: 150.0000 mg | ORAL_TABLET | Freq: Once | ORAL | Status: DC

## 2012-04-12 MED ORDER — PENICILLIN V POTASSIUM 500 MG PO TABS: 500.0000 mg | ORAL_TABLET | Freq: Three times a day (TID) | ORAL | Status: DC

## 2012-04-12 NOTE — ED Provider Notes (Signed)
Medical screening examination/treatment/procedure(s) were performed by resident physician or non-physician practitioner and as supervising physician I was immediately available for consultation/collaboration.   Braylen Staller DOUGLAS MD.    Leemon Ayala D Trudi Morgenthaler, MD 04/12/12 1920 

## 2012-04-12 NOTE — ED Provider Notes (Signed)
History     CSN: [id number]  Arrival date & time 04/12/12  1648   None     Chief Complaint  Patient presents with  . Dental Pain    (Consider location/radiation/quality/duration/timing/severity/associated sxs/prior treatment) Patient is a 36 y.o. female presenting with tooth pain.  Dental PainPrimary symptoms do not include sore throat. The symptoms began 5 to 7 days ago.  Additional symptoms include: facial swelling and ear pain. Additional symptoms do not include: trouble swallowing and hearing loss.   Left upper tooth pain for 7 days, has taken home ibuprofen with minimal with improvement.  Unable to be evaluation from dentist because she owes money prior to office visit.  Known history of fractured tooth and was told that she would require oral surgery to remove tooth. +Thermal sensitivity No fever + gingival bleeding No decreased intake of food/fluid Past Medical History  Diagnosis Date  . Fibromyalgia   . Depression   . Insomnia   . Hypertension   . MRSA (methicillin resistant Staphylococcus aureus) infection     Past Surgical History  Procedure Date  . Appendectomy   . Cesarean section   . Tubal ligation     Family History  Problem Relation Age of Onset  . Alcohol abuse Mother   . Hypertension Mother   . Drug abuse Father   . Hypertension Father   . Depression Paternal Aunt   . Depression Paternal Uncle   . Alcohol abuse Maternal Grandmother   . Arthritis Maternal Grandmother   . Cancer Maternal Grandmother   . Hypertension Maternal Grandmother   . Alcohol abuse Maternal Grandfather   . Hypertension Maternal Grandfather   . Arthritis Paternal Grandmother   . Cancer Paternal Grandmother   . Hypertension Paternal Grandmother   . Hypertension Paternal Grandfather     History  Substance Use Topics  . Smoking status: Never Smoker   . Smokeless tobacco: Not on file  . Alcohol Use: No    OB History    Grav Para Term Preterm Abortions TAB SAB Ect  Mult Living                  Review of Systems  Constitutional: Negative.   HENT: Positive for ear pain and facial swelling. Negative for hearing loss, sore throat, trouble swallowing, neck pain and neck stiffness.   Respiratory: Negative.   Cardiovascular: Negative.     Allergies  Dexamethasone; Iohexol; Ivp dye; and Sulfa antibiotics  Home Medications   Current Outpatient Rx  Name Route Sig Dispense Refill  . FUROSEMIDE PO Oral Take by mouth.    [name] LISINOPRIL 10 MG PO TABS Oral Take 1 tablet (10 mg total) by mouth daily. 30 tablet 1  . PHENTERMINE HCL PO Oral Take by mouth.    [name] CITALOPRAM HYDROBROMIDE 20 MG PO TABS Oral Take 20 mg by mouth daily.      [name] FLUCONAZOLE 150 MG PO TABS Oral Take 1 tablet (150 mg total) by mouth once. May repeat in one week as needed 2 tablet 0  . IBUPROFEN 600 MG PO TABS Oral Take 1 tablet (600 mg total) by mouth every 6 (six) hours as needed for pain. 30 tablet 0  . PENICILLIN V POTASSIUM 500 MG PO TABS Oral Take 1 tablet (500 mg total) by mouth 3 (three) times daily. 30 tablet 0  . PHENTERMINE HCL 37.5 MG PO TABS Oral Take 1 tablet (37.5 mg total) by mouth daily before breakfast. 30 tablet 0  . TRAMADOL  HCL 50 MG PO TABS Oral Take 1 tablet (50 mg total) by mouth every 6 (six) hours as needed for pain. 15 tablet 0  . ZOLPIDEM TARTRATE 10 MG PO TABS Oral Take 10 mg by mouth at bedtime as needed.        BP 148/90  Pulse 75  Temp 98.6 F (37 C) (Oral)  Resp 16  SpO2 100%  LMP 03/31/2012  Physical Exam  Nursing note and vitals reviewed. Constitutional: She is oriented to person, place, and time. Vital signs are normal. She appears well-developed and well-nourished. She is active and cooperative.  HENT:  Head: Normocephalic. No trismus in the jaw.  Right Ear: Hearing, tympanic membrane, external ear and ear canal normal.  Left Ear: Hearing, tympanic membrane, external ear and ear canal normal.  Nose: Nose normal. Right sinus exhibits no  maxillary sinus tenderness and no frontal sinus tenderness. Left sinus exhibits no maxillary sinus tenderness and no frontal sinus tenderness.  Mouth/Throat: Uvula is midline, oropharynx is clear and moist and mucous membranes are normal. Abnormal dentition. Dental abscesses and dental caries present. No uvula swelling.         Left facial tenderness, minimal swelling  Eyes: Conjunctivae normal are normal. Pupils are equal, round, and reactive to light. No scleral icterus.  Neck: Trachea normal. Neck supple.  Cardiovascular: Normal rate and regular rhythm.   Pulmonary/Chest: Effort normal and breath sounds normal.  Lymphadenopathy:       Head (right side): No submental, no submandibular, no tonsillar, no preauricular, no posterior auricular and no occipital adenopathy present.       Head (left side): No submental, no submandibular, no tonsillar, no preauricular, no posterior auricular and no occipital adenopathy present.    She has no cervical adenopathy.  Neurological: She is alert and oriented to person, place, and time. No cranial nerve deficit or sensory deficit.  Skin: Skin is warm and dry.  Psychiatric: She has a normal mood and affect. Her speech is normal and behavior is normal. Judgment and thought content normal. Cognition and memory are normal.    ED Course  Procedures (including critical care time)  Labs Reviewed - No data to display No results found.   1. Dental abscess   2. Pain, dental       MDM  Dentist appointment asap.  Take medication as prescribed.        Johnsie Kindred, NP 04/12/12 1809

## 2012-04-12 NOTE — ED Notes (Signed)
C/O intermittent left upper toothache over past week, with significant worsening today; states IBU no longer working.  Unable to go to dentist until she pays down office bill.

## 2012-04-13 ENCOUNTER — Other Ambulatory Visit: Payer: Self-pay

## 2012-04-13 MED ORDER — LISINOPRIL 10 MG PO TABS: 10.0000 mg | ORAL_TABLET | Freq: Every day | ORAL | Status: DC

## 2012-04-16 ENCOUNTER — Other Ambulatory Visit: Payer: Self-pay

## 2012-04-16 MED ORDER — LISINOPRIL 10 MG PO TABS: 10.0000 mg | ORAL_TABLET | Freq: Every day | ORAL | Status: DC

## 2012-04-29 ENCOUNTER — Ambulatory Visit: Payer: Managed Care, Other (non HMO) | Admitting: Family

## 2012-04-29 DIAGNOSIS — Z0289 Encounter for other administrative examinations: Secondary | ICD-10-CM

## 2012-07-29 ENCOUNTER — Ambulatory Visit: Payer: Managed Care, Other (non HMO) | Admitting: Family

## 2012-08-14 ENCOUNTER — Ambulatory Visit: Payer: Managed Care, Other (non HMO) | Admitting: Family

## 2012-08-17 ENCOUNTER — Other Ambulatory Visit: Payer: Self-pay

## 2012-08-17 MED ORDER — LISINOPRIL 10 MG PO TABS: 10.0000 mg | ORAL_TABLET | Freq: Every day | ORAL | Status: DC

## 2012-08-21 ENCOUNTER — Encounter: Payer: Self-pay | Admitting: Family

## 2012-08-21 ENCOUNTER — Ambulatory Visit (INDEPENDENT_AMBULATORY_CARE_PROVIDER_SITE_OTHER): Payer: Managed Care, Other (non HMO) | Admitting: Family

## 2012-08-21 VITALS — BP 120/84 | Temp 98.8°F | Wt 193.0 lb

## 2012-08-21 DIAGNOSIS — I10 Essential (primary) hypertension: Secondary | ICD-10-CM

## 2012-08-21 DIAGNOSIS — R079 Chest pain, unspecified: Secondary | ICD-10-CM

## 2012-08-21 DIAGNOSIS — E669 Obesity, unspecified: Secondary | ICD-10-CM

## 2012-08-21 LAB — CBC WITH DIFFERENTIAL/PLATELET
Basophils Absolute: 0.1 10*3/uL (ref 0.0–0.1)
Eosinophils Relative: 0.6 % (ref 0.0–5.0)
Lymphs Abs: 1.9 10*3/uL (ref 0.7–4.0)
Monocytes Relative: 6.2 % (ref 3.0–12.0)
Neutrophils Relative %: 67 % (ref 43.0–77.0)
Platelets: 378 10*3/uL (ref 150.0–400.0)
RDW: 13.3 % (ref 11.5–14.6)
WBC: 7.7 10*3/uL (ref 4.5–10.5)

## 2012-08-21 LAB — COMPREHENSIVE METABOLIC PANEL
ALT: 12 U/L (ref 0–35)
Albumin: 4 g/dL (ref 3.5–5.2)
CO2: 29 mEq/L (ref 19–32)
Calcium: 9.1 mg/dL (ref 8.4–10.5)
Chloride: 103 mEq/L (ref 96–112)
GFR: 136.83 mL/min (ref >60.00)
Glucose, Bld: 92 mg/dL (ref 70–99)
Potassium: 4.3 mEq/L (ref 3.5–5.1)
Sodium: 137 mEq/L (ref 135–145)
Total Protein: 7.4 g/dL (ref 6.0–8.3)

## 2012-08-21 LAB — TSH: TSH: 1.12 u[IU]/mL (ref 0.35–5.50)

## 2012-08-21 NOTE — Progress Notes (Signed)
Subjective:    Patient ID: Sandra Vang, female    DOB: Jan 22, 1976, 37 y.o.   MRN: [id number]  HPI  37 year old AA female, nonsmoker, patient of Dr. Orvan Falconer, presents to the office today as follow up for HTN. She is tolerating her medications well. She complains of occasional chest heaviness that can occur with or without relationship to exercise. Patient reports feeling an episode on the treadmill a few weeks ago that resolved with rest. Patient then reports a similar episode while driving in her car and another episode awakening from her sleep. Describes as dull pain or heaviness with short duration and diminishes with rest.    Patient reports having episodes in the past and has been seen in the emergency department with cardiac workup to have been negative. Denies any increase in stress more so than usual her caffeine. She has never had a stress test. Has a family history of sudden death in a maternal grandmother      Review of Systems  Constitutional: Negative.   HENT: Negative.   Eyes: Negative.   Respiratory: Negative.   Cardiovascular: Positive for chest pain.  Gastrointestinal: Negative.   Endocrine: Negative.   Genitourinary: Negative.   Musculoskeletal: Negative.   Skin: Negative.   Allergic/Immunologic: Negative.   Neurological: Negative.   Hematological: Negative.   Psychiatric/Behavioral: Negative.    Past Medical History  Diagnosis Date  . Fibromyalgia   . Depression   . Insomnia   . Hypertension   . MRSA (methicillin resistant Staphylococcus aureus) infection     History   Social History  . Marital Status: Legally Separated    Spouse Name: N/A    Number of Children: N/A  . Years of Education: N/A   Occupational History  . Not on file.   Social History Main Topics  . Smoking status: Never Smoker   . Smokeless tobacco: Not on file  . Alcohol Use: No  . Drug Use: No  . Sexually Active: Not on file   Other Topics Concern  . Not on file   Social  History Narrative  . No narrative on file    Past Surgical History  Procedure Laterality Date  . Appendectomy    . Cesarean section    . Tubal ligation      Family History  Problem Relation Age of Onset  . Alcohol abuse Mother   . Hypertension Mother   . Drug abuse Father   . Hypertension Father   . Depression Paternal Aunt   . Depression Paternal Uncle   . Alcohol abuse Maternal Grandmother   . Arthritis Maternal Grandmother   . Cancer Maternal Grandmother   . Hypertension Maternal Grandmother   . Alcohol abuse Maternal Grandfather   . Hypertension Maternal Grandfather   . Arthritis Paternal Grandmother   . Cancer Paternal Grandmother   . Hypertension Paternal Grandmother   . Hypertension Paternal Grandfather     Allergies  Allergen Reactions  . Dexamethasone     Severe vaginal and rectal burning  . Iohexol      Desc: HIVES   . Ivp Dye (Iodinated Diagnostic Agents)   . Sulfa Antibiotics Hives    Current Outpatient Prescriptions on File Prior to Visit  Medication Sig Dispense Refill  . ibuprofen (ADVIL,MOTRIN) 600 MG tablet Take 1 tablet (600 mg total) by mouth every 6 (six) hours as needed for pain.  30 tablet  0  . lisinopril (PRINIVIL,ZESTRIL) 10 MG tablet Take 1 tablet (10 mg total) by  mouth daily.  30 tablet  1  . zolpidem (AMBIEN) 10 MG tablet Take 10 mg by mouth at bedtime as needed.        . citalopram (CELEXA) 20 MG tablet Take 20 mg by mouth daily.        . FUROSEMIDE PO Take by mouth.      . phentermine (ADIPEX-P) 37.5 MG tablet Take 1 tablet (37.5 mg total) by mouth daily before breakfast.  30 tablet  0   No current facility-administered medications on file prior to visit.    BP 120/84  Temp(Src) 98.8 F (37.1 C) (Oral)  Wt 193 lb (87.544 kg)  BMI 36.49 kg/m2chart    Objective:   Physical Exam  Constitutional: She is oriented to person, place, and time. She appears well-developed and well-nourished.  HENT:  Right Ear: External ear normal.   Left Ear: External ear normal.  Nose: Nose normal.  Mouth/Throat: Oropharynx is clear and moist.  Neck: Normal range of motion. Neck supple.  Cardiovascular: Normal rate, regular rhythm and normal heart sounds.   Pulmonary/Chest: Effort normal and breath sounds normal.  Abdominal: Soft. Bowel sounds are normal.  Musculoskeletal: Normal range of motion.  Neurological: She is alert and oriented to person, place, and time.  Skin: Skin is warm and dry.  Psychiatric: She has a normal mood and affect.            Assessment & Plan:    Assessment:  1. Chest pain 2. HTN-unspecified, essential 3. Obesity  Plan: Labs CBC with diff, CMP, TSH. Refer to cardiology for stress test. Continue with weight loss, monitor salt intake. Re-check in 4-6 months.

## 2012-09-10 ENCOUNTER — Ambulatory Visit (INDEPENDENT_AMBULATORY_CARE_PROVIDER_SITE_OTHER): Payer: Managed Care, Other (non HMO) | Admitting: Cardiovascular Disease

## 2012-09-10 ENCOUNTER — Encounter: Payer: Self-pay | Admitting: Cardiovascular Disease

## 2012-09-10 ENCOUNTER — Encounter: Payer: Self-pay | Admitting: *Deleted

## 2012-09-10 VITALS — BP 122/80 | HR 67 | Ht 61.0 in | Wt 195.0 lb

## 2012-09-10 DIAGNOSIS — I10 Essential (primary) hypertension: Secondary | ICD-10-CM

## 2012-09-10 DIAGNOSIS — M797 Fibromyalgia: Secondary | ICD-10-CM

## 2012-09-10 NOTE — Patient Instructions (Signed)
Your physician recommends that you schedule a follow-up appointment in:   AS NEEDED   Your physician recommends that you continue on your current medications as directed. Please refer to the Current Medication list given to you today.   Your physician has requested that you have a stress echocardiogram. For further information please visit [email or web address]. Please follow instruction sheet as given.  

## 2012-09-10 NOTE — Assessment & Plan Note (Signed)
Atypical Normal ECG  F/U stress echo

## 2012-09-10 NOTE — Assessment & Plan Note (Signed)
Discussed low carb diet and exercise  Will increase activity level post ETT

## 2012-09-10 NOTE — Assessment & Plan Note (Signed)
Well controlled.  Continue current medications and low sodium Dash type diet.    

## 2012-09-10 NOTE — Assessment & Plan Note (Signed)
Continue NSAI's Deconditioning and this may be related to chest pressure Consider Cymbalta

## 2012-09-10 NOTE — Progress Notes (Signed)
Patient ID: Sandra Vang, female   DOB: 1975-07-11, 37 y.o.   MRN: [id number] 37 yo referred by Dr Orvan Falconer for chest pain CRF;s HTN Last few months had chest pressure. 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  No other activities bring pain on.  After about 30 minutes on treadmill chest gets tight and then resolves.  She has fibromyalgia and sometimes cant tell difference. Uses ibuprofen for this.  Been on lisinopril for BP and compliant has PRN lasix for dependant edema. No previous CAD.  Pain not positional or pleuritic Not getting worse but persistant  No rest pain.    ROS: Denies fever, malais, weight loss, blurry vision, decreased visual acuity, cough, sputum, SOB, hemoptysis, pleuritic pain, palpitaitons, heartburn, abdominal pain, melena, lower extremity edema, claudication, or rash.  All other systems reviewed and negative   General: Affect approp Overweigth black female Healthy:  appears stated ageEENT: normal Neck supple with no adenopathy JVP normal no bruits no thyromegaly Lungs clear with no wheezing and good diaphragmatic motion Heart:  S1/S2 no murmur,rub, gallop or click PMI normal Abdomen: benighn, BS positve, no tenderness, no AAA no bruit.  No HSM or HJR Distal pulses intact with no bruits No edema Neuro non-focal Skin warm and dry No muscular weakness  Medications Current Outpatient Prescriptions  Medication Sig Dispense Refill  . ibuprofen (ADVIL,MOTRIN) 600 MG tablet Take 1 tablet (600 mg total) by mouth every 6 (six) hours as needed for pain.  30 tablet  0  . lisinopril (PRINIVIL,ZESTRIL) 10 MG tablet Take 1 tablet (10 mg total) by mouth daily.  30 tablet  1   No current facility-administered medications for this visit.    Allergies Dexamethasone; Iohexol; Ivp dye; and Sulfa antibiotics  Family History: Family History  Problem Relation Age of Onset  . Alcohol abuse Mother   . Hypertension Mother   . Drug abuse Father   . Hypertension  Father   . Depression Paternal Aunt   . Depression Paternal Uncle   . Alcohol abuse Maternal Grandmother   . Arthritis Maternal Grandmother   . Cancer Maternal Grandmother   . Hypertension Maternal Grandmother   . Alcohol abuse Maternal Grandfather   . Hypertension Maternal Grandfather   . Arthritis Paternal Grandmother   . Cancer Paternal Grandmother   . Hypertension Paternal Grandmother   . Hypertension Paternal Grandfather     Social History: History   Social History  . Marital Status: Legally Separated    Spouse Name: N/A    Number of Children: N/A  . Years of Education: N/A   Occupational History  . Not on file.   Social History Main Topics  . Smoking status: Never Smoker   . Smokeless tobacco: Not on file  . Alcohol Use: No  . Drug Use: No  . Sexually Active: Not on file   Other Topics Concern  . Not on file   Social History Narrative  . No narrative on file    Electrocardiogram:  07/18/11  NSR rate 64 normal  Today NSR rate 63 normal  Assessment and Plan

## 2012-09-17 ENCOUNTER — Telehealth: Payer: Self-pay | Admitting: Family

## 2012-09-17 MED ORDER — FLUCONAZOLE 150 MG PO TABS: 150.0000 mg | ORAL_TABLET | Freq: Once | ORAL | Status: DC

## 2012-09-17 NOTE — Telephone Encounter (Signed)
Pt has yeast infection requesting diflucan with one refill call into [facility].

## 2012-09-17 NOTE — Telephone Encounter (Signed)
Rx sent. If sxs persist, pt needs OV

## 2012-09-18 ENCOUNTER — Encounter: Payer: Self-pay | Admitting: Cardiovascular Disease

## 2012-09-18 ENCOUNTER — Ambulatory Visit (HOSPITAL_COMMUNITY): Payer: Managed Care, Other (non HMO) | Attending: Cardiology

## 2012-09-18 DIAGNOSIS — R0789 Other chest pain: Secondary | ICD-10-CM

## 2012-09-18 DIAGNOSIS — R072 Precordial pain: Secondary | ICD-10-CM

## 2012-09-18 DIAGNOSIS — R0989 Other specified symptoms and signs involving the circulatory and respiratory systems: Secondary | ICD-10-CM

## 2012-09-18 DIAGNOSIS — M79609 Pain in unspecified limb: Secondary | ICD-10-CM | POA: Insufficient documentation

## 2012-09-18 DIAGNOSIS — R079 Chest pain, unspecified: Secondary | ICD-10-CM | POA: Insufficient documentation

## 2012-09-18 DIAGNOSIS — I10 Essential (primary) hypertension: Secondary | ICD-10-CM | POA: Insufficient documentation

## 2012-09-18 DIAGNOSIS — E669 Obesity, unspecified: Secondary | ICD-10-CM | POA: Insufficient documentation

## 2012-09-18 NOTE — Progress Notes (Signed)
Echocardiogram performed.  

## 2012-09-22 ENCOUNTER — Telehealth: Payer: Self-pay | Admitting: Cardiovascular Disease

## 2012-09-22 NOTE — Telephone Encounter (Signed)
New Problem:    Patient returned Christine's call regarding her Stress ECHO results.  Patient stated that she would call back in a few hours because she is currently at work.

## 2012-09-22 NOTE — Telephone Encounter (Signed)
Patient called stress echo results given. 

## 2012-10-14 ENCOUNTER — Other Ambulatory Visit: Payer: Self-pay

## 2012-10-14 MED ORDER — LISINOPRIL 10 MG PO TABS: 10.0000 mg | ORAL_TABLET | Freq: Every day | ORAL | Status: DC

## 2012-12-09 ENCOUNTER — Encounter: Payer: Managed Care, Other (non HMO) | Admitting: Family

## 2012-12-11 ENCOUNTER — Ambulatory Visit (INDEPENDENT_AMBULATORY_CARE_PROVIDER_SITE_OTHER): Payer: Managed Care, Other (non HMO) | Admitting: Family

## 2012-12-11 ENCOUNTER — Other Ambulatory Visit (HOSPITAL_COMMUNITY)
Admission: RE | Admit: 2012-12-11 | Discharge: 2012-12-11 | Disposition: A | Discharge status: Home / Self Care | Payer: Managed Care, Other (non HMO) | Source: Ambulatory Visit | Attending: Family | Admitting: Family

## 2012-12-11 ENCOUNTER — Encounter: Payer: Self-pay | Admitting: Family

## 2012-12-11 VITALS — BP 116/76 | HR 74 | Ht 61.0 in | Wt 198.0 lb

## 2012-12-11 DIAGNOSIS — Z01419 Encounter for gynecological examination (general) (routine) without abnormal findings: Secondary | ICD-10-CM | POA: Insufficient documentation

## 2012-12-11 DIAGNOSIS — Z113 Encounter for screening for infections with a predominantly sexual mode of transmission: Secondary | ICD-10-CM | POA: Insufficient documentation

## 2012-12-11 DIAGNOSIS — N760 Acute vaginitis: Secondary | ICD-10-CM | POA: Insufficient documentation

## 2012-12-11 DIAGNOSIS — E669 Obesity, unspecified: Secondary | ICD-10-CM

## 2012-12-11 DIAGNOSIS — Z0000 Encounter for general adult medical examination without abnormal findings: Secondary | ICD-10-CM

## 2012-12-11 DIAGNOSIS — I10 Essential (primary) hypertension: Secondary | ICD-10-CM

## 2012-12-11 DIAGNOSIS — R609 Edema, unspecified: Secondary | ICD-10-CM

## 2012-12-11 LAB — COMPREHENSIVE METABOLIC PANEL
ALT: 12 U/L (ref 0–35)
AST: 14 U/L (ref 0–37)
Albumin: 4 g/dL (ref 3.5–5.2)
Alkaline Phosphatase: 75 U/L (ref 39–117)
Calcium: 9.1 mg/dL (ref 8.4–10.5)
Chloride: 105 mEq/L (ref 96–112)
Potassium: 3.9 mEq/L (ref 3.5–5.1)
Sodium: 138 mEq/L (ref 135–145)
Total Protein: 7.4 g/dL (ref 6.0–8.3)

## 2012-12-11 LAB — POCT URINALYSIS DIPSTICK
Blood, UA: NEGATIVE
Ketones, UA: NEGATIVE
Spec Grav, UA: 1.02
Urobilinogen, UA: 0.2

## 2012-12-11 LAB — LIPID PANEL
Cholesterol: 180 mg/dL (ref 0–200)
LDL Cholesterol: 99 mg/dL (ref 0–99)
Total CHOL/HDL Ratio: 3
Triglycerides: 81 mg/dL (ref 0.0–149.0)

## 2012-12-11 LAB — CBC WITH DIFFERENTIAL/PLATELET
Basophils Absolute: 0 10*3/uL (ref 0.0–0.1)
Eosinophils Absolute: 0 10*3/uL (ref 0.0–0.7)
Hemoglobin: 13 g/dL (ref 12.0–15.0)
Lymphocytes Relative: 20.3 % (ref 12.0–46.0)
MCHC: 33.2 g/dL (ref 30.0–36.0)
Monocytes Relative: 4.9 % (ref 3.0–12.0)
Neutro Abs: 6.3 10*3/uL (ref 1.4–7.7)
Neutrophils Relative %: 73.9 % (ref 43.0–77.0)
Platelets: 403 10*3/uL — ABNORMAL HIGH (ref 150.0–400.0)
RDW: 13.4 % (ref 11.5–14.6)

## 2012-12-11 LAB — TSH: TSH: 1.54 u[IU]/mL (ref 0.35–5.50)

## 2012-12-11 MED ORDER — LISINOPRIL-HYDROCHLOROTHIAZIDE 20-12.5 MG PO TABS: 1.0000 | ORAL_TABLET | Freq: Every day | ORAL | Status: DC

## 2012-12-11 NOTE — Progress Notes (Signed)
Subjective:    Patient ID: Sandra Vang, female    DOB: [birth year], 37 y.o.   MRN: [id number]  HPI 37 year old African American female, nonsmoker is in for complete physical exam. She has a history of obesity and hypertension. She's currently taken lisinopril 10 mg once daily and tolerating it well. She has episodes of swelling but she foods that are high in salt content. She's able to elevate her feet and the edema subsides. Also has concerns of vaginal bleeding with intercourse that's been lifelong. Denies any concerns any sexually transmitted diseases. However, would like to be screened. She sexually active, unprotected, with one partner.   Review of Systems  Constitutional: Negative.   HENT: Negative.   Eyes: Negative.   Respiratory: Negative.   Cardiovascular: Positive for leg swelling. Negative for chest pain and palpitations.  Gastrointestinal: Negative.   Endocrine: Negative.   Genitourinary: Positive for vaginal pain. Negative for dysuria, hematuria, menstrual problem and pelvic pain.       Vaginal bleeding with intercourse.   Musculoskeletal: Negative.   Skin: Negative.   Allergic/Immunologic: Negative.   Neurological: Negative.   Hematological: Negative.   Psychiatric/Behavioral: Negative.    Past Medical History  Diagnosis Date  . Fibromyalgia   . Depression   . Insomnia   . Hypertension   . MRSA (methicillin resistant Staphylococcus aureus) infection     History   Social History  . Marital Status: Legally Separated    Spouse Name: N/A    Number of Children: N/A  . Years of Education: N/A   Occupational History  . Not on file.   Social History Main Topics  . Smoking status: Never Smoker   . Smokeless tobacco: Not on file  . Alcohol Use: No  . Drug Use: No  . Sexually Active: Not on file   Other Topics Concern  . Not on file   Social History Narrative  . No narrative on file    Past Surgical History  Procedure Laterality Date  . Appendectomy     . Cesarean section    . Tubal ligation      Family History  Problem Relation Age of Onset  . Alcohol abuse Mother   . Hypertension Mother   . Drug abuse Father   . Hypertension Father   . Depression Paternal Aunt   . Depression Paternal Uncle   . Alcohol abuse Maternal Grandmother   . Arthritis Maternal Grandmother   . Cancer Maternal Grandmother   . Hypertension Maternal Grandmother   . Alcohol abuse Maternal Grandfather   . Hypertension Maternal Grandfather   . Arthritis Paternal Grandmother   . Cancer Paternal Grandmother   . Hypertension Paternal Grandmother   . Hypertension Paternal Grandfather     Allergies  Allergen Reactions  . Dexamethasone     Severe vaginal and rectal burning  . Iohexol      Desc: HIVES   . Ivp Dye (Iodinated Diagnostic Agents)   . Sulfa Antibiotics Hives    Current Outpatient Prescriptions on File Prior to Visit  Medication Sig Dispense Refill  . ibuprofen (ADVIL,MOTRIN) 600 MG tablet Take 1 tablet (600 mg total) by mouth every 6 (six) hours as needed for pain.  30 tablet  0  . fluconazole (DIFLUCAN) 150 MG tablet Take 1 tablet (150 mg total) by mouth once.  1 tablet  1   No current facility-administered medications on file prior to visit.    BP 116/76  Pulse 74  Ht 5\' 1"  (1.549 m)  Wt 198 lb (89.812 kg)  BMI 37.43 kg/m2  SpO2 97%  LMP 06/02/2014chart    Objective:   Physical Exam  Constitutional: She is oriented to person, place, and time. She appears well-developed and well-nourished.  HENT:  Head: Normocephalic.  Right Ear: External ear normal.  Left Ear: External ear normal.  Nose: Nose normal.  Mouth/Throat: Oropharynx is clear and moist.  Eyes: Conjunctivae and EOM are normal. Pupils are equal, round, and reactive to light.  Neck: Normal range of motion. Neck supple. No thyromegaly present.  Cardiovascular: Normal rate, regular rhythm and normal heart sounds.   Pulmonary/Chest: Effort normal and breath sounds normal.   Abdominal: Soft. Bowel sounds are normal.  Genitourinary: Vagina normal and uterus normal. No vaginal discharge found.  Musculoskeletal: Normal range of motion.  Neurological: She is alert and oriented to person, place, and time. She has normal reflexes.  Skin: Skin is warm and dry.  Psychiatric: She has a normal mood and affect.          Assessment & Plan:  Assessment:  1. Complete physical exam 2. Obesity  3. Peripheral edema and 4. Hypertension 5. Screening for sexually transmitted diseases  Plan: Lab sent to include HIV, CBC, lipids, TSH, RPR, HSV 1 and 2, CMP will notify patient pending results. Encouraged healthy diet, exercise, weight reduction. We'll follow with patient and the results of her labs, and sooner as needed.

## 2012-12-11 NOTE — Patient Instructions (Addendum)
Exercise to Lose Weight Exercise and a healthy diet may help you lose weight. Your doctor may suggest specific exercises. EXERCISE IDEAS AND TIPS  Choose low-cost things you enjoy doing, such as walking, bicycling, or exercising to workout videos.  Take stairs instead of the elevator.  Walk during your lunch break.  Park your car further away from work or school.  Go to a gym or an exercise class.  Start with 5 to 10 minutes of exercise each day. Build up to 30 minutes of exercise 4 to 6 days a week.  Wear shoes with good support and comfortable clothes.  Stretch before and after working out.  Work out until you breathe harder and your heart beats faster.  Drink extra water when you exercise.  Do not do so much that you hurt yourself, feel dizzy, or get very short of breath. Exercises that burn about 150 calories:  Running 1  miles in 15 minutes.  Playing volleyball for 45 to 60 minutes.  Washing and waxing a car for 45 to 60 minutes.  Playing touch football for 45 minutes.  Walking 1  miles in 35 minutes.  Pushing a stroller 1  miles in 30 minutes.  Playing basketball for 30 minutes.  Raking leaves for 30 minutes.  Bicycling 5 miles in 30 minutes.  Walking 2 miles in 30 minutes.  Dancing for 30 minutes.  Shoveling snow for 15 minutes.  Swimming laps for 20 minutes.  Walking up stairs for 15 minutes.  Bicycling 4 miles in 15 minutes.  Gardening for 30 to 45 minutes.  Jumping rope for 15 minutes.  Washing windows or floors for 45 to 60 minutes. Document Released: 07/20/2010 Document Revised: 09/09/2011 Document Reviewed: 07/20/2010 [facility] Patient Information 2014 [place], [place].  Breast Self-Examination You should begin examining your breasts at age 82 even though the risk for breast cancer is low in this age group. It is important to become familiar with how your breasts look and feel. This is true for pregnant women, nursing mothers, women in  menopause and women who have breast implants.  Women should examine their breasts once a month to look for changes and lumps. By doing monthly breast exams, you get to know how your breasts feel and how they can change from month to month. This allows you to pick up changes early. It can also offer you some reassurance that your breast health is good. This exam only takes minutes. Most breast lumps are not caused by cancer. If you find a lump, a special x-ray called a mammogram, or other tests may be needed to determine what is wrong.  Some of the signs that a breast lump is caused by cancer include:  Dimpling of the skin or changes in the shape of the breast or nipple.   A dark-colored or bloody discharge from the nipple.   Swollen lymph glands around the breast or in the armpit.   Redness of the breast or nipple.   Scaly nipple or skin on the breast.   Pain or swelling of the breast.  SELF-EXAM There are a few points to follow when doing a thorough breast exam. The best time to examine your breasts is 5 to 7 days after the menstrual period is over. During menstruation, the breasts are lumpier, and it may be more difficult to pick up changes. If you do not menstruate, have reached menopause or had a hysterectomy, examine your breasts the first day of every month. After three to  four months, you will become more familiar with the variations of your breasts and more comfortable with the exam.  Perform your breast exam monthly. Keep a written record with breast changes or normal findings for each breast. This makes it easier to be sure of changes and to not solely depend on memory for size, tenderness, or location. Try to do the exam at the same time each month, and write down where you are in your menstrual cycle if you are still menstruating.   Look at your breasts. Stand in front of a mirror with your hands clasped behind your head. Tighten your chest muscles and look for asymmetry. This means a  difference in shape or contour from one breast to the other, such as puckers, dips or bumps. Look also for skin changes.   Lean forward with your hands on your hips. Again, look for symmetry and skin changes.   While showering, soap the breasts, and carefully feel the breasts with fingertips while holding the arm (on the side of the breast being examined) over the head. Do this with each breast carefully feeling for lumps or changes. Typically, a circular motion with moderate fingertip pressure should be used.   Repeat this exam while lying on your back, again with your arm behind your head and a pillow under your shoulders. Again, use your fingertips to examine both breasts, feeling for lumps and thickening. Begin at 1 o'clock and go clockwise around the whole breast.   At the end of your exam, gently squeeze each nipple to see if there is any drainage. Look for nipple changes, dimpling or redness.   Lastly, examine the upper chest and clavicle areas and in your armpits.  It is not necessary to become alarmed if you find a breast lump. Most of them are not cancerous. However, it is necessary to see your caregiver if a lump is found in order to have it looked at. Document Released: 07/25/2004 Document Revised: 02/27/2011 Document Reviewed: 10/04/2008 [facility] Patient Information 2012 [place], [place].

## 2012-12-12 LAB — HIV ANTIBODY (ROUTINE TESTING W REFLEX): HIV: NONREACTIVE

## 2012-12-12 LAB — RPR

## 2012-12-16 ENCOUNTER — Other Ambulatory Visit: Payer: Self-pay | Admitting: Family

## 2012-12-16 MED ORDER — METRONIDAZOLE 0.75 % VA GEL: 1.0000 | Freq: Every day | VAGINAL | Status: AC

## 2012-12-17 ENCOUNTER — Telehealth: Payer: Self-pay | Admitting: Family

## 2012-12-17 NOTE — Telephone Encounter (Signed)
Call back attempted at 1423 on 6-19, vmail left.

## 2012-12-17 NOTE — Telephone Encounter (Signed)
Call back attempted at 0830 on 6-19, vmail left.

## 2012-12-17 NOTE — Telephone Encounter (Signed)
Patient Information:  Caller Name: Sandra Vang  Phone: [phone]  Patient: Sandra Vang, Sandra Vang  Gender: Female  DOB: [birth year]  Age: 37 Years  PCP: Adline Mango [facility])  Pregnant: No  Office Follow Up:  Does the office need to follow up with this patient?: Yes  Instructions For The Office: PLS READ RN NOTE  RN Note:  Pt having vaginal irriation w/ moderate itching after using new wash clothes.  Pt would like 2 Diflucan tablets called in, states Sandra Vang usually calls in 2 because it takes that many before symptoms improve. Standing orders only allow 1 tablet, offered the 1 tablet and to call back on 6-20 if symptoms didn't improve.  Pt would like to see if 2 tablets can be called instead of 1.  Pt uses [facility], [address], [phone].  PLEASE REIVEW W/ PA AND F/U W/ PT.  Symptoms  Reason For Call & Symptoms: ER CALL. Vaginal Irritation, itching  Reviewed Health History In EMR: N/A  Reviewed Medications In EMR: N/A  Reviewed Allergies In EMR: N/A  Reviewed Surgeries / Procedures: N/A  Date of Onset of Symptoms: 12/15/2012 OB / GYN:  LMP: 12/05/2012  Guideline(s) Used:  Vulvar Symptoms  Disposition Per Guideline:   See Today or Tomorrow in Office  Reason For Disposition Reached:   Moderate-Severe itching (i.e., interferes with school, work, or sleep)  Advice Given:  N/A  Patient Will Follow Care Advice:  YES

## 2012-12-18 MED ORDER — FLUCONAZOLE 150 MG PO TABS: 150.0000 mg | ORAL_TABLET | Freq: Once | ORAL | Status: DC

## 2012-12-18 NOTE — Telephone Encounter (Signed)
Left message to advise pt Rx sent for 2 tabs

## 2012-12-18 NOTE — Telephone Encounter (Signed)
No sure if this was sent. I will send.

## 2012-12-21 ENCOUNTER — Telehealth: Payer: Self-pay | Admitting: Family

## 2012-12-21 NOTE — Telephone Encounter (Signed)
Returned call concerning "Yeast Infection/Med Questions" and left message after second attempt.

## 2012-12-22 ENCOUNTER — Other Ambulatory Visit: Payer: Self-pay

## 2012-12-22 MED ORDER — CLINDAMYCIN PHOSPHATE 100 MG VA SUPP: 100.0000 mg | Freq: Every day | VAGINAL | Status: DC

## 2012-12-22 NOTE — Telephone Encounter (Signed)
Pt requesting flagyl or another abx for bacterial vaginosis because metro gel is causing irritation and she is also requesting 2 more tabs of diflucan because she knows flagyl will give her a yeast infection. She said that she wasn't experiencing any sxs until after she had the PAP and when she went to the pharmacy to request a refill of the diflucan that's when she was told that the metrogel was ready. She knew nothing about needing the metro gel because I was unable to reach her by phone but once she checked her messages and her mail, she was made aware of her bacterial vaginosis dx. Pt decided at that time to take the 2 diflucan before starting the metro gel and once she started the metro gel she was still irritated. Please advise

## 2012-12-22 NOTE — Telephone Encounter (Signed)
Spoke with pt to advise of Padonda's note. Pt states that she took the diflucan one day apart. When advised of Padonda's note pt says that her "body doesn't work like that". Advised of PCP suggestion. When asked about completion of metro gel, pt now states that she never began the metro gel because it irritated her in the past. Pt ok with vaginal suppository and would like to know what to do about the vaginosis. Please advise

## 2012-12-22 NOTE — Telephone Encounter (Signed)
She should NOT have taken Diflucan x 2. Diflucan can stay in the body up to 1 week. That's why the dosage if necessary is not repeated until that time. If she ever has questions, advise her to call the office or use mychart, I will be glad to answer. Since she has taken 2 antifungals by mouth, she needs to consider a vaginal suppository to prevent the possibility or resistance.

## 2012-12-22 NOTE — Telephone Encounter (Signed)
I am sending a clindamycin Rx to the pharmacy

## 2012-12-22 NOTE — Progress Notes (Signed)
Error

## 2013-01-20 ENCOUNTER — Encounter: Payer: Self-pay | Admitting: Family

## 2013-02-17 ENCOUNTER — Encounter: Payer: Self-pay | Admitting: Family

## 2013-02-19 ENCOUNTER — Ambulatory Visit: Payer: Managed Care, Other (non HMO) | Admitting: Family

## 2013-03-04 ENCOUNTER — Encounter (HOSPITAL_COMMUNITY): Payer: Self-pay | Admitting: Emergency Medicine

## 2013-03-04 ENCOUNTER — Other Ambulatory Visit (HOSPITAL_COMMUNITY)
Admission: RE | Admit: 2013-03-04 | Discharge: 2013-03-04 | Disposition: A | Discharge status: Home / Self Care | Payer: Managed Care, Other (non HMO) | Source: Ambulatory Visit | Attending: Family Medicine | Admitting: Family Medicine

## 2013-03-04 ENCOUNTER — Emergency Department (INDEPENDENT_AMBULATORY_CARE_PROVIDER_SITE_OTHER)
Admission: EM | Admit: 2013-03-04 | Discharge: 2013-03-04 | Disposition: A | Discharge status: Home / Self Care | Payer: Managed Care, Other (non HMO) | Source: Home / Self Care | Attending: Family Medicine | Admitting: Family Medicine

## 2013-03-04 DIAGNOSIS — Z113 Encounter for screening for infections with a predominantly sexual mode of transmission: Secondary | ICD-10-CM | POA: Insufficient documentation

## 2013-03-04 DIAGNOSIS — N760 Acute vaginitis: Secondary | ICD-10-CM | POA: Insufficient documentation

## 2013-03-04 DIAGNOSIS — N898 Other specified noninflammatory disorders of vagina: Secondary | ICD-10-CM

## 2013-03-04 LAB — POCT URINALYSIS DIP (DEVICE)
Bilirubin Urine: NEGATIVE
Ketones, ur: NEGATIVE mg/dL
Nitrite: NEGATIVE
Specific Gravity, Urine: 1.02 (ref 1.005–1.030)
pH: 7 (ref 5.0–8.0)

## 2013-03-04 MED ORDER — FLUCONAZOLE 150 MG PO TABS: 150.0000 mg | ORAL_TABLET | Freq: Once | ORAL | Status: DC

## 2013-03-04 MED ORDER — CLINDAMYCIN PHOSPHATE 100 MG VA SUPP: 100.0000 mg | Freq: Every day | VAGINAL | Status: DC

## 2013-03-04 MED ORDER — METRONIDAZOLE 500 MG PO TABS: 500.0000 mg | ORAL_TABLET | Freq: Two times a day (BID) | ORAL | Status: DC

## 2013-03-04 NOTE — ED Notes (Signed)
C/o vaginal discharge and pain.

## 2013-03-04 NOTE — ED Provider Notes (Signed)
Sandra Vang is a 37 y.o. female who presents to Urgent Care today for vaginal discharge or dyspareunia present for several days. Patient notes a fishy odorous vaginal discharge. She notes occasional pain and bleeding following sex. She feels well otherwise no fevers or chills. Her current symptoms are consistent with prior episodes of BV. No nausea vomiting diarrhea. No trouble breathing.   Past Medical History  Diagnosis Date  . Fibromyalgia   . Depression   . Insomnia   . Hypertension   . MRSA (methicillin resistant Staphylococcus aureus) infection    History  Substance Use Topics  . Smoking status: Never Smoker   . Smokeless tobacco: Not on file  . Alcohol Use: No   ROS as above Medications reviewed. No current facility-administered medications for this encounter.   Current Outpatient Prescriptions  Medication Sig Dispense Refill  . clindamycin (CLEOCIN) 100 MG vaginal suppository Place 1 suppository (100 mg total) vaginally at bedtime.  9 suppository  0  . fluconazole (DIFLUCAN) 150 MG tablet Take 1 tablet (150 mg total) by mouth once. May repeat in 1 week  2 tablet  0  . ibuprofen (ADVIL,MOTRIN) 600 MG tablet Take 1 tablet (600 mg total) by mouth every 6 (six) hours as needed for pain.  30 tablet  0  . lisinopril-hydrochlorothiazide (ZESTORETIC) 20-12.5 MG per tablet Take 1 tablet by mouth daily.  90 tablet  3  . metroNIDAZOLE (FLAGYL) 500 MG tablet Take 1 tablet (500 mg total) by mouth 2 (two) times daily.  14 tablet  0    Exam:  BP 137/76  Pulse 80  Temp(Src) 99.3 F (37.4 C) (Oral)  Resp 18  SpO2 100% Gen: Well NAD HEENT: EOMI,  MMM Lungs: CTABL Nl WOB Heart: RRR no MRG Abd: NABS, NT, ND Exts: Non edematous BL  LE, warm and well perfused.  GYN: Normal external genitalia. Vaginal canal with thin white discharge. Normal-appearing cervix. No cervical motion tenderness or adnexal masses  Results for orders placed during the hospital encounter of 03/04/13 (from  the past 24 hour(s))  POCT PREGNANCY, URINE     Status: None   Collection Time    03/04/13  8:30 PM      Result Value Range   Preg Test, Ur NEGATIVE  NEGATIVE  POCT URINALYSIS DIP (DEVICE)     Status: Abnormal   Collection Time    03/04/13  8:31 PM      Result Value Range   Glucose, UA NEGATIVE  NEGATIVE mg/dL   Bilirubin Urine NEGATIVE  NEGATIVE   Ketones, ur NEGATIVE  NEGATIVE mg/dL   Specific Gravity, Urine 1.020  1.005 - 1.030   Hgb urine dipstick TRACE (*) NEGATIVE   pH 7.0  5.0 - 8.0   Protein, ur NEGATIVE  NEGATIVE mg/dL   Urobilinogen, UA 0.2  0.0 - 1.0 mg/dL   Nitrite NEGATIVE  NEGATIVE   Leukocytes, UA NEGATIVE  NEGATIVE   No results found.  Assessment and Plan: 37 y.o. female with vaginal. Likely be possibly yeast. Plan to treat empirically for both BV and yeast while awaiting cytology for gonorrhea, Chlamydia, trichomonas, yeast and BV. We'll treat with clindamycin suppositories and/or metronidazole tablets and fluconazole.  Followup as needed     Rodolph Bong, MD 03/04/13 2056

## 2013-03-05 NOTE — ED Notes (Signed)
GC/Chlamydia neg., Affirm: Candida and Trich neg., Gardnerella pos.  Pt. adequately treated with Flagyl. Vassie Moselle 03/05/2013

## 2013-03-29 ENCOUNTER — Encounter: Payer: Self-pay | Admitting: Family

## 2013-03-29 ENCOUNTER — Other Ambulatory Visit: Payer: Self-pay | Admitting: Family

## 2013-03-29 MED ORDER — CITALOPRAM HYDROBROMIDE 20 MG PO TABS: 20.0000 mg | ORAL_TABLET | Freq: Every day | ORAL | Status: DC

## 2013-05-06 ENCOUNTER — Other Ambulatory Visit: Payer: Self-pay

## 2013-06-16 ENCOUNTER — Encounter: Payer: Self-pay | Admitting: Family

## 2013-06-18 ENCOUNTER — Other Ambulatory Visit: Payer: Self-pay | Admitting: Family

## 2013-06-18 MED ORDER — DOXYCYCLINE HYCLATE 100 MG PO TABS: 100.0000 mg | ORAL_TABLET | Freq: Two times a day (BID) | ORAL | Status: DC

## 2013-06-19 ENCOUNTER — Encounter (HOSPITAL_COMMUNITY): Payer: Self-pay | Admitting: Emergency Medicine

## 2013-06-19 ENCOUNTER — Emergency Department (INDEPENDENT_AMBULATORY_CARE_PROVIDER_SITE_OTHER)
Admission: EM | Admit: 2013-06-19 | Discharge: 2013-06-19 | Disposition: A | Discharge status: Home / Self Care | Payer: Managed Care, Other (non HMO) | Source: Home / Self Care | Attending: Emergency Medicine | Admitting: Emergency Medicine

## 2013-06-19 DIAGNOSIS — L0291 Cutaneous abscess, unspecified: Secondary | ICD-10-CM

## 2013-06-19 MED ORDER — LIDOCAINE-EPINEPHRINE-TETRACAINE (LET) SOLUTION
NASAL | Status: AC
  Filled 2013-06-19: qty 3

## 2013-06-19 MED ORDER — HYDROCODONE-ACETAMINOPHEN 5-325 MG PO TABS
2.0000 | ORAL_TABLET | Freq: Once | ORAL | Status: AC
  Administered 2013-06-19: 2 via ORAL

## 2013-06-19 MED ORDER — LIDOCAINE-EPINEPHRINE-TETRACAINE (LET) SOLUTION
3.0000 mL | Freq: Once | NASAL | Status: AC
  Administered 2013-06-19: 14:00:00 3 mL via TOPICAL

## 2013-06-19 MED ORDER — HYDROCODONE-ACETAMINOPHEN 5-325 MG PO TABS
ORAL_TABLET | ORAL | Status: AC
  Filled 2013-06-19: qty 2

## 2013-06-19 MED ORDER — MUPIROCIN 2 % EX OINT: 1.0000 "application " | TOPICAL_OINTMENT | Freq: Three times a day (TID) | CUTANEOUS | Status: DC

## 2013-06-19 MED ORDER — HYDROCODONE-ACETAMINOPHEN 5-325 MG PO TABS: ORAL_TABLET | ORAL | Status: DC

## 2013-06-19 NOTE — Discharge Instructions (Signed)
MRSA (or methicillin resistant Staphlococcus aureus) is a leading cause of boils, abscesses and skin infections.  While this can be the cause of serious infections, it is most often easily treated, prevented, and cured.  This bacteria like other bacterial infections something you catch from somebody or something.  It can be transmitted from family, friends, casual acquaintances or even pets by close person-to-person contact or contact with an infected object.  MRSA lives on the skin and in the nasal passages.  It can invade into the skin through a break in the skin or sometimes it just invades by itself into a skin pore causing an area of redness, swelling and pain.  When this happens, it can cause a sticking sensation, so many people mistake it for an insect bite.    If it causes a collection of pus (a boil or abscess) it should be drained.  Often packing or a drain will be left in place to allow the pus to drain for a few days.  This packing will need to be removed after 2 to 3 days.  If the wound is deep, it may need to be repacked.  If there is no collection of pus (cellulitis) incision and drainage is not immediately necessary, but antibiotics will be prescribed.  Sometimes cellulitis will turn into an abscess, so if symptoms persist, it's best to return for a recheck.  With MRSA infection (and even with non-MRSA staph infections) recurrence can be a problem.  This is because the bacteria can continue to live on the skin and in the nasal passages, presenting a risk for re-infection.  There are some steps that you can take to completely eradicate the infection and thus prevent future infections:   Finish up your antibiotics completely.  This is one of the things that trips many people up.  They stop the antibiotics when they feel better.  To make sure that the infection doesn't come back, take all the pills until they are completely gone.  MRSA and non-MRSA staph often take up residence in the nasal  passages.  If they are not eliminated from this reservoir, the infection will return again and again.  Elimination can be achieved by applying a prescription antibiotic ointment, mupirocin, to both nostrils 3 times daily for a month. If the infection persists despite doing this, family members may be carriers.  Sometimes it is necessary to treat the entire family in this manner.  Decontamination of the skin is necessary.  This can take a while.  The current recommendation is Clorox baths twice weekly for 3 months.  This can be done by diluting 4 oz of Clorox bleach in a tub of bathwater.  Then soak in this Clorox solution up to your neck for 15 minutes.   Take infectious precautions:  Wash or sanitize hands frequently, spray tub or shower with Lysol after use, [place] towels or wash cloths only once, then launder, do not share towels or wash cloths with anyone else, do not share clothing with anyone else, launder clothing and bed clothes frequently.     You have had an abscess drained.  An abscess is a collection of pus caused by infection with skin bacteria such as Streptococcus or Staphylococcus.  Since this is and infection, you may be contagious.  For the first 2 days, leave the dressing in place and keep it clean and dry. This means you should not get it wet.  You will have to take a sponge bath rather than  a shower.  If the abscess was packed, we may instruct you to come back in 2 to 3 days to have the packing removed.  If the abscess was not packed, you may remove the dressing yourself in 2 days and take care of the wound yourself.  After the packing is out, change the dressing at least once a day.  You may bathe or shower once the packing has been removed.  Assemble all the dressing material before you change the dressing, wear gloves, dispose of the soiled dressing material well and wash your hands before and after changing the dressing.  Wash the area well with soap and water, taking care to remove  all the dried blood and drainage.  Apply a thin layer of antibiotic ointment (Bacitracin or Polysporin) around the abscess cavity, then apply a gauze dressing.  You may want to use a non-adherent dressing like Telfa.  Fasten this in place well with tape.  Continue to change the dressing until there is no further drainage.  Finish up the entire prescription of any antibiotics that you have been given.  Take infectious precautions since the bacteria that cause these abscesses may be contagious.  Wash hands frequently or use hand sanitizer, especially after touching the abscess area or changing dressings.  Do not allow anyone else to use your towel or washcloth and wash these items after each use until the abscess has healed.  You may want to use an antibacterial soap such as Dial or Safeguard or a prescription body wash like Hibiclens.  You also may want to consider spraying the tub or shower with a disinfectant such a Lysol until the abscess has healed.  Things that should prompt you to return to the office for a recheck include:  Fever over 100 degrees, increasing pain or drainage, failure of the abscess to heal after 10 days, or other skin lesions elsewhere.

## 2013-06-19 NOTE — ED Notes (Signed)
37 yr old is here today complaints of abscess on her right buttock for 4 days. Alittle Nausea, HA and feeling crappy.She states the abscess started in the middle of the right buttock and is not going to her left buttock. She states she has tried hot compresses but no success. She has a Hx of abscess, MRSA. She has tried Ibuprofen and Tylenol. Denies: SOB; Chest pain

## 2013-06-19 NOTE — ED Provider Notes (Addendum)
Chief Complaint:   Chief Complaint  Patient presents with  . Abscess    History of Present Illness:    Sandra Vang is a 37 year old female who has had an abscess on her left buttock for the past 4 days. This has not drained any pus. She denies any fever or chills. She has had some nausea and headache. She has had abscesses in the past and has a history of MRSA.  Review of Systems:  Other than noted above, the patient denies any of the following symptoms: Systemic:  No fever, chills or sweats. Skin:  No rash or itching.  PMFSH:  Past medical history, family history, social history, meds, and allergies were reviewed.  No history of diabetes.  Physical Exam:   Vital signs:  BP 127/82  Pulse 97  Temp(Src) 99.6 F (37.6 C) (Oral)  Resp 16  SpO2 100%  LMP 05/25/2013 Skin:  There is a raised, indurated, tender area on the left buttock, near the intergluteal cleft.  Skin exam was otherwise normal.  No rash. Ext:  Distal pulses were full, patient has full ROM of all joints.  Procedure:  Verbal informed consent was obtained.  The patient was informed of the risks and benefits of the procedure and understands and accepts.  Identity of the patient was verified verbally and by wristband.   The abscess area described above was prepped with Betadine and alcohol and anesthetized with lidocaine/epinephrine, tetracaine, then with 3 mL of 2% Xylocaine with epinephrine.  Using a #11 scalpel blade, a singe straight incision was made into the area of fluctulence, yielding a moderate amount of prurulent drainage.  Routine cultures were obtained.  Blunt dissection was used to break up loculations and the resulting wound cavity was packed with 1/4 inch Iodoform gauze.  A sterile pressure dressing was applied.  Assessment:  The encounter diagnosis was Abscess.  Plan:   1.  Meds:  The following meds were prescribed:   Discharge Medication List as of 06/19/2013  1:33 PM    START taking these medications    Details  HYDROcodone-acetaminophen (NORCO/VICODIN) 5-325 MG per tablet 1 to 2 tabs every 4 to 6 hours as needed for pain., Print    mupirocin ointment (BACTROBAN) 2 % Apply 1 application topically 3 (three) times daily., Starting 06/19/2013, Until Discontinued, Normal        2.  Patient Education/Counseling:  The patient was given appropriate handouts, self care instructions, and instructed in symptomatic relief.  This is a recurring problem. Will instruct the patient in a MRSA decontamination regimen including twice weekly Clorox baths for 3 months and application mupirocin ointment to the nostrils 3 times a day for one month.  3.  Follow up:  The patient was instructed to leave the dressing in place and return here again in 48 hours for packing removal, or sooner if becoming worse in any way, and given some red flag symptoms such as fever which would prompt immediate return.     Reuben Likes, MD 06/19/13 2150  Addendum: The patient stated that she had a prescription for doxycycline and then been prescribed by another physician, which she had not gotten it filled yet. She was encouraged to go ahead and get this filled.  Reuben Likes, MD 06/22/13 [phone]

## 2013-06-21 ENCOUNTER — Encounter: Payer: Self-pay | Admitting: Family

## 2013-06-21 ENCOUNTER — Ambulatory Visit (INDEPENDENT_AMBULATORY_CARE_PROVIDER_SITE_OTHER): Payer: Managed Care, Other (non HMO) | Admitting: Family

## 2013-06-21 VITALS — BP 122/74 | HR 90 | Wt 198.0 lb

## 2013-06-21 DIAGNOSIS — L0291 Cutaneous abscess, unspecified: Secondary | ICD-10-CM

## 2013-06-21 NOTE — Progress Notes (Signed)
Subjective:    Patient ID: Sandra Vang, female    DOB: [birth year], 37 y.o.   MRN: [id number]  HPI  37 year old AA female, nonsmoker, presenting for follow-up.  She was seen at Urgent care on Saturday for abscess on inside of right buttocks.  She has been taking antibiotics as prescribed.  She is concerned because this is her fourth boil in 2 weeks.  She has a history of MRSA in the past.    Review of Systems  Constitutional: Negative.   Respiratory: Negative.   Cardiovascular: Negative.   Skin: Positive for wound.       Abscess on inner right buttock   Past Medical History  Diagnosis Date  . Fibromyalgia   . Depression   . Insomnia   . Hypertension   . MRSA (methicillin resistant Staphylococcus aureus) infection     History   Social History  . Marital Status: Legally Separated    Spouse Name: N/A    Number of Children: N/A  . Years of Education: N/A   Occupational History  . Not on file.   Social History Main Topics  . Smoking status: Never Smoker   . Smokeless tobacco: Not on file  . Alcohol Use: No  . Drug Use: No  . Sexual Activity: Not on file   Other Topics Concern  . Not on file   Social History Narrative  . No narrative on file    Past Surgical History  Procedure Laterality Date  . Appendectomy    . Cesarean section    . Tubal ligation      Family History  Problem Relation Age of Onset  . Alcohol abuse Mother   . Hypertension Mother   . Drug abuse Father   . Hypertension Father   . Depression Paternal Aunt   . Depression Paternal Uncle   . Alcohol abuse Maternal Grandmother   . Arthritis Maternal Grandmother   . Cancer Maternal Grandmother   . Hypertension Maternal Grandmother   . Alcohol abuse Maternal Grandfather   . Hypertension Maternal Grandfather   . Arthritis Paternal Grandmother   . Cancer Paternal Grandmother   . Hypertension Paternal Grandmother   . Hypertension Paternal Grandfather     Allergies  Allergen Reactions    . Dexamethasone     Severe vaginal and rectal burning  . Iohexol      Desc: HIVES   . Ivp Dye [Iodinated Diagnostic Agents]   . Sulfa Antibiotics Hives    Current Outpatient Prescriptions on File Prior to Visit  Medication Sig Dispense Refill  . citalopram (CELEXA) 20 MG tablet Take 1 tablet (20 mg total) by mouth daily.  30 tablet  3  . clindamycin (CLEOCIN) 100 MG vaginal suppository Place 1 suppository (100 mg total) vaginally at bedtime.  9 suppository  0  . doxycycline (VIBRA-TABS) 100 MG tablet Take 1 tablet (100 mg total) by mouth 2 (two) times daily.  20 tablet  0  . HYDROcodone-acetaminophen (NORCO/VICODIN) 5-325 MG per tablet 1 to 2 tabs every 4 to 6 hours as needed for pain.  20 tablet  0  . ibuprofen (ADVIL,MOTRIN) 600 MG tablet Take 1 tablet (600 mg total) by mouth every 6 (six) hours as needed for pain.  30 tablet  0  . lisinopril-hydrochlorothiazide (ZESTORETIC) 20-12.5 MG per tablet Take 1 tablet by mouth daily.  90 tablet  3  . mupirocin ointment (BACTROBAN) 2 % Apply 1 application topically 3 (three) times daily.  22 g  0   No current facility-administered medications on file prior to visit.    BP 122/74  Pulse 90  Wt 198 lb (89.812 kg)  LMP 11/25/2014chart    Objective:   Physical Exam  Constitutional: She is oriented to person, place, and time. She appears well-developed and well-nourished.  Cardiovascular: Normal rate and regular rhythm.   Pulmonary/Chest: Effort normal and breath sounds normal.  Neurological: She is alert and oriented to person, place, and time.  Skin: Skin is warm and dry.  Dime sized area noted on right inner buttock.  Pink in color.  No drainage noted.          Assessment & Plan:  Assessment 1. Abscess-healing  Plan Continue current medication.  Bandage with neosporin as needed til healed.  Encourage strict handwashing techniques and to finish antibiotics.  Contact office for questions and concerns.

## 2013-06-21 NOTE — Patient Instructions (Signed)
Abscess  Care After  An abscess (also called a boil or furuncle) is an infected area that contains a collection of pus. Signs and symptoms of an abscess include pain, tenderness, redness, or hardness, or you may feel a moveable soft area under your skin. An abscess can occur anywhere in the body. The infection may spread to surrounding tissues causing cellulitis. A cut (incision) by the surgeon was made over your abscess and the pus was drained out. Gauze may have been packed into the space to provide a drain that will allow the cavity to heal from the inside outwards. The boil may be painful for 5 to 7 days. Most people with a boil do not have high fevers. Your abscess, if seen early, may not have localized, and may not have been lanced. If not, another appointment may be required for this if it does not get better on its own or with medications.  HOME CARE INSTRUCTIONS   · Only take over-the-counter or prescription medicines for pain, discomfort, or fever as directed by your caregiver.  · When you bathe, soak and then remove gauze or iodoform packs at least daily or as directed by your caregiver. You may then wash the wound gently with mild soapy water. Repack with gauze or do as your caregiver directs.  SEEK IMMEDIATE MEDICAL CARE IF:   · You develop increased pain, swelling, redness, drainage, or bleeding in the wound site.  · You develop signs of generalized infection including muscle aches, chills, fever, or a general ill feeling.  · An oral temperature above 102° F (38.9° C) develops, not controlled by medication.  See your caregiver for a recheck if you develop any of the symptoms described above. If medications (antibiotics) were prescribed, take them as directed.  Document Released: 01/03/2005 Document Revised: 09/09/2011 Document Reviewed: 08/31/2007  ExitCare® Patient Information ©2014 [employer].

## 2013-06-22 LAB — CULTURE, ROUTINE-ABSCESS

## 2013-06-27 NOTE — ED Notes (Signed)
Abscess culture L buttocks:  MRSA.  Pt. adequately treated with Doxycycline. Dr. Lorenz Coaster asked me to call to see if pt. is taking the medication. I called pt. Pt. verified x 2 and given results.  Pt. told she was adequately treated with Doxycycline.  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 She said she was taking care of her mother in law and she had it.  She said she only has one pill left and it is much better. Sandra Vang 06/27/2013

## 2013-07-14 ENCOUNTER — Encounter: Payer: Self-pay | Admitting: Family

## 2013-08-02 ENCOUNTER — Other Ambulatory Visit: Payer: Self-pay | Admitting: Family

## 2013-08-06 ENCOUNTER — Other Ambulatory Visit: Payer: Self-pay | Admitting: Family

## 2013-08-06 MED ORDER — CITALOPRAM HYDROBROMIDE 20 MG PO TABS: ORAL_TABLET | ORAL | Status: DC

## 2013-11-01 ENCOUNTER — Encounter (HOSPITAL_COMMUNITY): Payer: Self-pay | Admitting: Emergency Medicine

## 2013-11-01 ENCOUNTER — Emergency Department (HOSPITAL_COMMUNITY)
Admission: EM | Admit: 2013-11-01 | Discharge: 2013-11-01 | Disposition: A | Discharge status: Home / Self Care | Payer: Managed Care, Other (non HMO) | Source: Home / Self Care | Attending: Emergency Medicine | Admitting: Emergency Medicine

## 2013-11-01 ENCOUNTER — Emergency Department (INDEPENDENT_AMBULATORY_CARE_PROVIDER_SITE_OTHER): Payer: Managed Care, Other (non HMO)

## 2013-11-01 DIAGNOSIS — S6990XA Unspecified injury of unspecified wrist, hand and finger(s), initial encounter: Secondary | ICD-10-CM

## 2013-11-01 DIAGNOSIS — Y9229 Other specified public building as the place of occurrence of the external cause: Secondary | ICD-10-CM

## 2013-11-01 DIAGNOSIS — W19XXXA Unspecified fall, initial encounter: Secondary | ICD-10-CM

## 2013-11-01 DIAGNOSIS — T1490XA Injury, unspecified, initial encounter: Secondary | ICD-10-CM

## 2013-11-01 DIAGNOSIS — S59909A Unspecified injury of unspecified elbow, initial encounter: Secondary | ICD-10-CM

## 2013-11-01 DIAGNOSIS — S59919A Unspecified injury of unspecified forearm, initial encounter: Secondary | ICD-10-CM

## 2013-11-01 DIAGNOSIS — W010XXA Fall on same level from slipping, tripping and stumbling without subsequent striking against object, initial encounter: Secondary | ICD-10-CM

## 2013-11-01 NOTE — Discharge Instructions (Signed)
Contusion A contusion is a deep bruise. Contusions are the result of an injury that caused bleeding under the skin. The contusion may turn blue, purple, or yellow. Minor injuries will give you a painless contusion, but more severe contusions may stay painful and swollen for a few weeks.  CAUSES  A contusion is usually caused by a blow, trauma, or direct force to an area of the body. SYMPTOMS   Swelling and redness of the injured area.  Bruising of the injured area.  Tenderness and soreness of the injured area.  Pain. DIAGNOSIS  The diagnosis can be made by taking a history and physical exam. An X-ray, CT scan, or MRI may be needed to determine if there were any associated injuries, such as fractures. TREATMENT  Specific treatment will depend on what area of the body was injured. In general, the best treatment for a contusion is resting, icing, elevating, and applying cold compresses to the injured area. Over-the-counter medicines may also be recommended for pain control. Ask your caregiver what the best treatment is for your contusion. HOME CARE INSTRUCTIONS   Put ice on the injured area.  Put ice in a plastic bag.  Place a towel between your skin and the bag.  Leave the ice on for 15-20 minutes, 03-04 times a day.  Only take over-the-counter or prescription medicines for pain, discomfort, or fever as directed by your caregiver. Your caregiver may recommend avoiding anti-inflammatory medicines (aspirin, ibuprofen, and naproxen) for 48 hours because these medicines may increase bruising.  Rest the injured area.  If possible, elevate the injured area to reduce swelling. SEEK IMMEDIATE MEDICAL CARE IF:   You have increased bruising or swelling.  You have pain that is getting worse.  Your swelling or pain is not relieved with medicines. MAKE SURE YOU:   Understand these instructions.  Will watch your condition.  Will get help right away if you are not doing well or get  worse. Document Released: 03/27/2005 Document Revised: 09/09/2011 Document Reviewed: 04/22/2011 [facility] LLCExitCare Patient Information 2014 [place]ExitCare, [place]. Cast or Splint Care Casts and splints support injured limbs and keep bones from moving while they heal. It is important to care for your cast or splint at home.  HOME CARE INSTRUCTIONS  Keep the cast or splint uncovered during the drying period. It can take 24 to 48 hours to dry if it is made of plaster. A fiberglass cast will dry in less than 1 hour.  Do not rest the cast on anything harder than a pillow for the first 24 hours.  Do not put weight on your injured limb or apply pressure to the cast until your health care provider gives you permission.  Keep the cast or splint dry. Wet casts or splints can lose their shape and may not support the limb as well. A wet cast that has lost its shape can also create harmful pressure on your skin when it dries. Also, wet skin can become infected.  Cover the cast or splint with a plastic bag when bathing or when out in the rain or snow. If the cast is on the trunk of the body, take sponge baths until the cast is removed.  If your cast does become wet, dry it with a towel or a blow dryer on the cool setting only.  Keep your cast or splint clean. Soiled casts may be wiped with a moistened cloth.  Do not place any hard or soft foreign objects under your cast or splint, such  as cotton, toilet paper, lotion, or powder.  Do not try to scratch the skin under the cast with any object. The object could get stuck inside the cast. Also, scratching could lead to an infection. If itching is a problem, use a blow dryer on a cool setting to relieve discomfort.  Do not trim or cut your cast or remove padding from inside of it.  Exercise all joints next to the injury that are not immobilized by the cast or splint. For example, if you have a long leg cast, exercise the hip joint and toes. If you have an arm cast or  splint, exercise the shoulder, elbow, thumb, and fingers.  Elevate your injured arm or leg on 1 or 2 pillows for the first 1 to 3 days to decrease swelling and pain.It is best if you can comfortably elevate your cast so it is higher than your heart. SEEK MEDICAL CARE IF:   Your cast or splint cracks.  Your cast or splint is too tight or too loose.  You have unbearable itching inside the cast.  Your cast becomes wet or develops a soft spot or area.  You have a bad smell coming from inside your cast.  You get an object stuck under your cast.  Your skin around the cast becomes red or raw.  You have new pain or worsening pain after the cast has been applied. SEEK IMMEDIATE MEDICAL CARE IF:   You have fluid leaking through the cast.  You are unable to move your fingers or toes.  You have discolored (blue or white), cool, painful, or very swollen fingers or toes beyond the cast.  You have tingling or numbness around the injured area.  You have severe pain or pressure under the cast.  You have any difficulty with your breathing or have shortness of breath.  You have chest pain. Document Released: 06/14/2000 Document Revised: 04/07/2013 Document Reviewed: 12/24/2012 [facility]ExitCare Patient Information 2014 [place]ExitCare, [place]LLC.

## 2013-11-01 NOTE — Progress Notes (Signed)
Orthopedic Tech Progress Note Patient Details:  Delray AltFelicia Newport 12/03/1975 [id number] Post long arm splint applied to LUE. Extra padding added around elbow area where pain persists. Application tolerated well. Ortho Devices Type of Ortho Device: Ace wrap;Arm sling;Post (long arm) splint Ortho Device/Splint Location: LUE Ortho Device/Splint Interventions: Application   Asia R Thompson 11/01/2013, 10:00 AM

## 2013-11-01 NOTE — ED Notes (Signed)
Reports falling on left elbow on Sunday.  Having pain and swelling.  Limited ROM.  Mild relief with advil and ice.

## 2013-11-01 NOTE — ED Notes (Signed)
Pt waiting ortho tech.  Mw,cma 

## 2013-11-01 NOTE — ED Provider Notes (Signed)
Chief Complaint    Chief Complaint  Patient presents with  . Arm Injury    left elbow pain    History of Present Illness     Sandra Vang is a 38 year old female who felt yesterday afternoon, landing on her left elbow. The patient states she just slipped on level ground, but landed on concrete. She states the elbow took the brunt of the fall. She denies hitting her head or any loss of consciousness. She denies any pain elsewhere other than her elbow, but the elbow pain does radiate down the entire left arm into the wrist. There is no numbness, tingling, or weakness. Her shoulder, wrist, and hand are not painful.  Review of Systems     Other than as noted above, the patient denies any of the following symptoms: Systemic:  No fevers or chills.   Musculoskeletal:  No joint pain, arthritis, back pain, or neck pain. Neurological:  No muscular weakness or paresthesias.  PMFSH    Past medical history, family history, social history, meds, and allergies were reviewed.    Physical Exam    Vital signs:  BP 102/68  Pulse 78  Temp(Src) 98.5 F (36.9 C) (Oral)  Resp 12  SpO2 99%  LMP 10/11/2013 Gen:  Alert and oriented times 3.  In no distress. Musculoskeletal: Exam of the elbow reveals no swelling or bruising. There is no deformity. The elbow has a limited range of motion both with flexion and extension with pain on movement. There is also pain on pronation and supination.  Otherwise, all joints had a full a ROM with no swelling, bruising or deformity.  No edema, pulses full. Extremities were warm and pink.  Capillary refill was brisk.  Skin:  Clear, warm and dry.  No rash. Neuro:  Alert and oriented times 3.  Muscle strength was normal.  Sensation was intact to light touch.    Radiology     Dg Elbow Complete Left  11/01/2013   CLINICAL DATA:  Status post fall 1 day ago.  Left elbow pain.  EXAM: LEFT ELBOW - COMPLETE 3+ VIEW  COMPARISON:  None.  FINDINGS: The patient has a large  elbow joint effusion. No fracture is identified. The elbow is located. Soft tissues are unremarkable.  IMPRESSION: Large elbow joint effusion without visible fracture could be due to bone contusion or radiographically occult fracture.   Electronically Signed   By: Drusilla Kannerhomas  Dalessio M.D.   On: 11/01/2013 09:07   I reviewed the images independently and personally and concur with the radiologist's findings.  [facility] in [facility]   She was placed in a long arm splint inflection and given a sling.  Assessment    The primary encounter diagnosis was Elbow injury. Diagnoses of Place of occurrence, public building and Fall with injury were also pertinent to this visit.  She will need to follow up with orthopedics in about a week.  Plan   1.  Meds:  The following meds were prescribed:   Discharge Medication List as of 11/01/2013  9:29 AM      2.  Patient Education/Counseling:  The patient was given appropriate handouts, self care instructions, and instructed in symptomatic relief, including rest and activity, elevation, application of ice and compression.    3.  Follow up:  The patient was told to follow up here if no better in 3 to 4 days, or sooner if becoming worse in any way, and given some red flag symptoms such as worsening pain  or new neurological symptoms which would prompt immediate return.  She will need to follow up with Dr. Venita Lickahari Brooks, who is our on-call orthopedist for today within the next week.    Reuben Likesavid C Afton Mikelson, MD 11/01/13 [phone]

## 2013-11-03 MED ORDER — HYDROCODONE-ACETAMINOPHEN 5-325 MG PO TABS: ORAL_TABLET | ORAL | Status: DC

## 2013-11-03 NOTE — ED Notes (Signed)
C/o not been pain free all day. Does offer of pain medication rx still stand?Sherron Monday. Spoke w Dr Lorenz CoasterKeller, and he will provide pick up rx for pain. Patient advised, and will come by ASAP

## 2013-12-22 ENCOUNTER — Other Ambulatory Visit: Payer: Self-pay | Admitting: Family

## 2014-02-09 ENCOUNTER — Ambulatory Visit (INDEPENDENT_AMBULATORY_CARE_PROVIDER_SITE_OTHER): Payer: Managed Care, Other (non HMO) | Admitting: Family

## 2014-02-09 ENCOUNTER — Telehealth: Payer: Self-pay | Admitting: Family

## 2014-02-09 ENCOUNTER — Encounter: Payer: Self-pay | Admitting: Family

## 2014-02-09 VITALS — BP 128/84 | HR 72 | Temp 98.3°F | Ht 60.75 in | Wt 183.5 lb

## 2014-02-09 DIAGNOSIS — Z0000 Encounter for general adult medical examination without abnormal findings: Secondary | ICD-10-CM

## 2014-02-09 DIAGNOSIS — Z1239 Encounter for other screening for malignant neoplasm of breast: Secondary | ICD-10-CM

## 2014-02-09 DIAGNOSIS — Z113 Encounter for screening for infections with a predominantly sexual mode of transmission: Secondary | ICD-10-CM

## 2014-02-09 DIAGNOSIS — I10 Essential (primary) hypertension: Secondary | ICD-10-CM

## 2014-02-09 DIAGNOSIS — Z1231 Encounter for screening mammogram for malignant neoplasm of breast: Secondary | ICD-10-CM

## 2014-02-09 LAB — CBC WITH DIFFERENTIAL/PLATELET
BASOS PCT: 0.5 % (ref 0.0–3.0)
Basophils Absolute: 0 10*3/uL (ref 0.0–0.1)
EOS ABS: 0.1 10*3/uL (ref 0.0–0.7)
EOS PCT: 0.8 % (ref 0.0–5.0)
HCT: 37.8 % (ref 36.0–46.0)
Hemoglobin: 12.4 g/dL (ref 12.0–15.0)
LYMPHS PCT: 24.3 % (ref 12.0–46.0)
Lymphs Abs: 1.7 10*3/uL (ref 0.7–4.0)
MCHC: 32.8 g/dL (ref 30.0–36.0)
MCV: 87.3 fl (ref 78.0–100.0)
MONOS PCT: 4.8 % (ref 3.0–12.0)
Monocytes Absolute: 0.3 10*3/uL (ref 0.1–1.0)
NEUTROS PCT: 69.6 % (ref 43.0–77.0)
Neutro Abs: 4.9 10*3/uL (ref 1.4–7.7)
Platelets: 381 10*3/uL (ref 150.0–400.0)
RBC: 4.33 Mil/uL (ref 3.87–5.11)
RDW: 13.7 % (ref 11.5–15.5)
WBC: 7.1 10*3/uL (ref 4.0–10.5)

## 2014-02-09 LAB — POCT URINALYSIS DIPSTICK
Bilirubin, UA: NEGATIVE
Blood, UA: NEGATIVE
Glucose, UA: NEGATIVE
KETONES UA: NEGATIVE
Nitrite, UA: NEGATIVE
Spec Grav, UA: 1.02
Urobilinogen, UA: 0.2
pH, UA: 7

## 2014-02-09 LAB — COMPREHENSIVE METABOLIC PANEL
ALBUMIN: 4 g/dL (ref 3.5–5.2)
ALT: 13 U/L (ref 0–35)
AST: 15 U/L (ref 0–37)
Alkaline Phosphatase: 64 U/L (ref 39–117)
BUN: 13 mg/dL (ref 6–23)
CALCIUM: 9.1 mg/dL (ref 8.4–10.5)
CHLORIDE: 102 meq/L (ref 96–112)
CO2: 27 meq/L (ref 19–32)
Creatinine, Ser: 0.7 mg/dL (ref 0.4–1.2)
GFR: 122.22 mL/min (ref >60.00)
GLUCOSE: 92 mg/dL (ref 70–99)
POTASSIUM: 3.8 meq/L (ref 3.5–5.1)
Sodium: 137 mEq/L (ref 135–145)
Total Bilirubin: 0.6 mg/dL (ref 0.2–1.2)
Total Protein: 7.4 g/dL (ref 6.0–8.3)

## 2014-02-09 LAB — LIPID PANEL
CHOL/HDL RATIO: 3
CHOLESTEROL: 184 mg/dL (ref 0–200)
HDL: 60.7 mg/dL (ref >39.00)
LDL CALC: 109 mg/dL — AB (ref 0–99)
NonHDL: 123.3
Triglycerides: 72 mg/dL (ref 0.0–149.0)
VLDL: 14.4 mg/dL (ref 0.0–40.0)

## 2014-02-09 LAB — TSH: TSH: 1.3 u[IU]/mL (ref 0.35–4.50)

## 2014-02-09 NOTE — Progress Notes (Signed)
Pre visit review using our clinic review tool, if applicable. No additional management support is needed unless otherwise documented below in the visit note. 

## 2014-02-09 NOTE — Patient Instructions (Signed)

## 2014-02-09 NOTE — Progress Notes (Signed)
Subjective:    Patient ID: Sandra Vang, female    DOB: 1976-01-30, 38 y.o.   MRN: [id number]  HPI  38 year old [place] American female, nonsmoker is in today for a physical exam. Requesting STD screening. Push on to reconcile with her ex-husband up with unsuccessful. Routinely exercise. Never had baseline mammogram. Tries to follow a healthy diet. Has a history of Hypertension.  Review of Systems  Constitutional: Negative.   HENT: Negative.   Eyes: Negative.   Respiratory: Negative.   Cardiovascular: Negative.   Gastrointestinal: Negative.   Endocrine: Negative.   Genitourinary: Negative.   Musculoskeletal: Negative.   Skin: Negative.   Allergic/Immunologic: Negative.   Neurological: Negative.   Hematological: Negative.   Psychiatric/Behavioral: Negative.    Past Medical History  Diagnosis Date  . Fibromyalgia   . Depression   . Insomnia   . Hypertension   . MRSA (methicillin resistant Staphylococcus aureus) infection     History   Social History  . Marital Status: Legally Separated    Spouse Name: N/A    Number of Children: N/A  . Years of Education: N/A   Occupational History  . Not on file.   Social History Main Topics  . Smoking status: Never Smoker   . Smokeless tobacco: Not on file  . Alcohol Use: No  . Drug Use: No  . Sexual Activity: Not on file   Other Topics Concern  . Not on file   Social History Narrative  . No narrative on file    Past Surgical History  Procedure Laterality Date  . Appendectomy    . Cesarean section    . Tubal ligation      Family History  Problem Relation Age of Onset  . Alcohol abuse Mother   . Hypertension Mother   . Drug abuse Father   . Hypertension Father   . Depression Paternal Aunt   . Depression Paternal Uncle   . Alcohol abuse Maternal Grandmother   . Arthritis Maternal Grandmother   . Cancer Maternal Grandmother   . Hypertension Maternal Grandmother   . Alcohol abuse Maternal Grandfather   .  Hypertension Maternal Grandfather   . Arthritis Paternal Grandmother   . Cancer Paternal Grandmother   . Hypertension Paternal Grandmother   . Hypertension Paternal Grandfather     Allergies  Allergen Reactions  . Dexamethasone     Severe vaginal and rectal burning  . Iohexol      Desc: HIVES   . Ivp Dye [Iodinated Diagnostic Agents]   . Sulfa Antibiotics Hives    Current Outpatient Prescriptions on File Prior to Visit  Medication Sig Dispense Refill  . ibuprofen (ADVIL,MOTRIN) 600 MG tablet Take 1 tablet (600 mg total) by mouth every 6 (six) hours as needed for pain.  30 tablet  0  . lisinopril-hydrochlorothiazide (PRINZIDE,ZESTORETIC) 20-12.5 MG per tablet TAKE 1 TABLET BY MOUTH DAILY  30 tablet  0   No current facility-administered medications on file prior to visit.    BP 128/84  Pulse 72  Temp(Src) 98.3 F (36.8 C) (Oral)  Ht 5' 0.75" (1.543 m)  Wt 183 lb 8 oz (83.235 kg)  BMI 34.96 kg/m2  LMP 08/03/2015chart    Objective:   Physical Exam  Constitutional: She is oriented to person, place, and time. She appears well-developed and well-nourished.  HENT:  Head: Normocephalic.  Right Ear: External ear normal.  Left Ear: External ear normal.  Nose: Nose normal.  Mouth/Throat: Oropharynx is clear and moist.  Eyes:  Conjunctivae and EOM are normal. Pupils are equal, round, and reactive to light.  Neck: Normal range of motion. Neck supple. No thyromegaly present.  Cardiovascular: Normal rate, regular rhythm and normal heart sounds.   Pulmonary/Chest: Effort normal and breath sounds normal.  Abdominal: Soft. Bowel sounds are normal. There is no tenderness. There is no rebound.  Genitourinary:  Not indicated this year  Musculoskeletal: Normal range of motion.  Neurological: She is alert and oriented to person, place, and time. She has normal reflexes.  Skin: Skin is warm.  Psychiatric: She has a normal mood and affect.          Assessment & Plan:  Sandra Vang was  seen today for annual exam.  Diagnoses and associated orders for this visit:  Preventative health care - CMP - CBC with Differential - TSH - POC Urinalysis Dipstick - Lipid Panel  Unspecified essential hypertension - CMP - CBC with Differential - TSH - POC Urinalysis Dipstick - Lipid Panel  Screen for STD (sexually transmitted disease) - HIV antibody (with reflex) - GC/Chlamydia Amp Probe, Urine  Breast cancer screening, high risk patient - MM Digital Screening; Future   Call the office with her any questions or concerns. Recheck in 6 months and sooner as her  needed.

## 2014-02-09 NOTE — Telephone Encounter (Signed)
Relevant patient education assigned to patient using Emmi. ° °

## 2014-02-10 LAB — HIV ANTIBODY (ROUTINE TESTING W REFLEX): HIV: NONREACTIVE

## 2014-02-10 LAB — GC/CHLAMYDIA PROBE AMP, URINE
Chlamydia, Swab/Urine, PCR: NEGATIVE
GC Probe Amp, Urine: NEGATIVE

## 2014-03-16 ENCOUNTER — Ambulatory Visit: Payer: Managed Care, Other (non HMO) | Admitting: Family

## 2014-03-29 ENCOUNTER — Other Ambulatory Visit: Payer: Self-pay | Admitting: Family

## 2014-04-05 ENCOUNTER — Other Ambulatory Visit: Payer: Self-pay | Admitting: Family

## 2014-04-05 MED ORDER — LISINOPRIL-HYDROCHLOROTHIAZIDE 20-12.5 MG PO TABS: ORAL_TABLET | ORAL | Status: DC

## 2014-04-15 ENCOUNTER — Other Ambulatory Visit: Payer: Self-pay

## 2014-08-18 ENCOUNTER — Other Ambulatory Visit (HOSPITAL_COMMUNITY)
Admission: RE | Admit: 2014-08-18 | Discharge: 2014-08-18 | Disposition: A | Discharge status: Home / Self Care | Payer: Managed Care, Other (non HMO) | Source: Ambulatory Visit | Attending: Family Medicine | Admitting: Family Medicine

## 2014-08-18 ENCOUNTER — Encounter: Payer: Self-pay | Admitting: Family Medicine

## 2014-08-18 ENCOUNTER — Ambulatory Visit (INDEPENDENT_AMBULATORY_CARE_PROVIDER_SITE_OTHER): Payer: Managed Care, Other (non HMO) | Admitting: Family Medicine

## 2014-08-18 VITALS — BP 100/80 | HR 80 | Ht 60.75 in | Wt 189.5 lb

## 2014-08-18 DIAGNOSIS — N760 Acute vaginitis: Secondary | ICD-10-CM | POA: Insufficient documentation

## 2014-08-18 DIAGNOSIS — I10 Essential (primary) hypertension: Secondary | ICD-10-CM

## 2014-08-18 DIAGNOSIS — Z7689 Persons encountering health services in other specified circumstances: Secondary | ICD-10-CM

## 2014-08-18 DIAGNOSIS — Z113 Encounter for screening for infections with a predominantly sexual mode of transmission: Secondary | ICD-10-CM

## 2014-08-18 DIAGNOSIS — R002 Palpitations: Secondary | ICD-10-CM

## 2014-08-18 DIAGNOSIS — Z7189 Other specified counseling: Secondary | ICD-10-CM

## 2014-08-18 DIAGNOSIS — Z23 Encounter for immunization: Secondary | ICD-10-CM

## 2014-08-18 DIAGNOSIS — E669 Obesity, unspecified: Secondary | ICD-10-CM

## 2014-08-18 DIAGNOSIS — K5900 Constipation, unspecified: Secondary | ICD-10-CM

## 2014-08-18 NOTE — Patient Instructions (Addendum)
BEFORE YOU LEAVE: -Tdap -labs -schedule 1 month follow up -diet handout  Stop caffeine and supplement. Let me know if palpitations persist and you want to do monitor  Start metameucil daily.  Mirilax as needed for 3-5 days 1-2 times daily

## 2014-08-18 NOTE — Addendum Note (Signed)
Addended by: Johnella MoloneyFUNDERBURK, JO A on: 08/18/2014 09:24 AM   Modules accepted: Orders

## 2014-08-18 NOTE — Addendum Note (Signed)
Addended by: Sallee LangeFUNDERBURK, Jenese Mischke A on: 08/18/2014 11:30 AM   Modules accepted: Orders

## 2014-08-18 NOTE — Progress Notes (Signed)
Pre visit review using our clinic review tool, if applicable. No additional management support is needed unless otherwise documented below in the visit note. 

## 2014-08-18 NOTE — Progress Notes (Signed)
HPI:  Sandra Vang is here to establish care.  Used to see Pad[facility]. Last PCP and physical: she sees Dr. Cherly Hensen for her gyn exams, she saw padonada for physical as well in 2015.  Has the following chronic problems that require follow up and concerns today:  Wants STI screening: -no symptoms -but had unprotected sex with x-husband a few months ago -denies: vag discharge, fevers, dysuria, vaginal symptoms, pelvic or vag pain  Obesity: -going to curves -watching calories on fitness pal, lowering simple carbs -90-120 minutes CV exercise per week  HTN: -on lisinopril-hctz -denies: CP, SOB, DOE, swelling, HA  Occ palpitations: -after starting a supplement -no CP, SOB, DOE, swelling  Constipation: -intermittent, has had trouble with intermittent loose stool and constipation chronically -for several months more contstilation, sometimes strains, feels rumble in stomach -denies blood in stool, melena, fevers, diarrhea, nausea, vomiting  ROS negative for unless reported above: fevers, unintentional weight loss, hearing or vision loss, chest pain, palpitations, struggling to breath, hemoptysis, melena, hematochezia, hematuria, falls, loc, si, thoughts of self harm  Past Medical History  Diagnosis Date  . Fibromyalgia   . Depression   . Insomnia   . Hypertension   . MRSA (methicillin resistant Staphylococcus aureus) infection     Past Surgical History  Procedure Laterality Date  . Appendectomy    . Cesarean section    . Tubal ligation      Family History  Problem Relation Age of Onset  . Alcohol abuse Mother   . Hypertension Mother   . Drug abuse Father   . Hypertension Father   . Depression Paternal Aunt   . Depression Paternal Uncle   . Alcohol abuse Maternal Grandmother   . Arthritis Maternal Grandmother   . Cancer Maternal Grandmother   . Hypertension Maternal Grandmother   . Alcohol abuse Maternal Grandfather   . Hypertension Maternal Grandfather   .  Arthritis Paternal Grandmother   . Cancer Paternal Grandmother   . Hypertension Paternal Grandmother   . Hypertension Paternal Grandfather     History   Social History  . Marital Status: Legally Separated    Spouse Name: N/A  . Number of Children: N/A  . Years of Education: N/A   Social History Main Topics  . Smoking status: Never Smoker   . Smokeless tobacco: Not on file  . Alcohol Use: No  . Drug Use: No  . Sexual Activity: Not on file   Other Topics Concern  . 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  diphenhydrAMINE (BENADRYL) 50 MG tablet, Take 50 mg by mouth at bedtime as needed for itching., Disp: , Rfl:  .  ibuprofen (ADVIL,MOTRIN) 600 MG tablet, Take 1 tablet (600 mg total) by mouth every 6 (six) hours as needed for pain., Disp: 30 tablet, Rfl: 0 .  lisinopril-hydrochlorothiazide (PRINZIDE,ZESTORETIC) 20-12.5 MG per tablet, TAKE 1 TABLET BY MOUTH DAILY(PATIENT NEEDS APPOINTMENT), Disp: 90 tablet, Rfl: 0 .  Multiple Vitamin (MULTIVITAMIN) capsule, Take 1 capsule by mouth daily. Herbalife Total Control, Disp: , Rfl:   EXAM:  Filed Vitals:   08/18/14 0825  BP: 100/80  Pulse: 80    Body  mass index is 36.1 kg/(m^2).  GENERAL: vitals reviewed and listed above, alert, oriented, appears well hydrated and in no acute distress  HEENT: atraumatic, conjunttiva clear, no obvious abnormalities on inspection of external nose and ears  NECK: no obvious masses on inspection  LUNGS: clear to auscultation bilaterally, no wheezes, rales or rhonchi, good air movement  CV: HRRR, no peripheral edema  ABD: BS+, soft, NTTP  GU: normal ext exam, no vag discharge  MS: moves all extremities without noticeable abnormality  PSYCH:  pleasant and cooperative, no obvious depression or anxiety  ASSESSMENT AND PLAN:  Discussed the following assessment and plan:  1. Constipation, unspecified constipation type -likely IBS per hx -advised trial fodmaps, trial tx for constipation -close f/u in 1 month, may need GI eval if persists given change in bowels, then tx for IBS if normal eval  2. Palpitations -stop caffeine supplement -close follow up and further eval if persists, worsens, other symptoms or frequent   3. Essential hypertension -cont current care  4. Obesity -lifestyle recs  5. Routine screening for STI (sexually transmitted infection) Per her request: HIV, RPR, GC/Chlam, trich  6. Encounter to establish care -We reviewed the PMH, PSH, FH, SH, Meds and Allergies. -We provided refills for any medications we will prescribe as needed. -We addressed current concerns per orders and patient instructions. -We have asked for records for pertinent exams, studies, vaccines and notes from previous providers. -We have advised patient to follow up per instructions below.   -Patient advised to return or notify a doctor immediately if symptoms worsen or persist or new concerns arise.  Patient Instructions  BEFORE YOU LEAVE: -Tdap -labs -schedule 1 month follow up -diet handout  Stop caffeine and supplement. Let me know if palpitations persist and you want to do monitor  Start metameucil daily.  Mirilax as needed for 3-5 days 1-2 times daily     Dani Danis R.

## 2014-08-19 LAB — CERVICOVAGINAL ANCILLARY ONLY
Chlamydia: NEGATIVE
NEISSERIA GONORRHEA: NEGATIVE
Trichomonas: NEGATIVE

## 2014-08-19 LAB — RPR

## 2014-08-19 LAB — HIV ANTIBODY (ROUTINE TESTING W REFLEX): HIV 1&2 Ab, 4th Generation: NONREACTIVE

## 2014-10-06 ENCOUNTER — Ambulatory Visit: Payer: Managed Care, Other (non HMO) | Admitting: Family Medicine

## 2014-11-21 ENCOUNTER — Emergency Department (HOSPITAL_COMMUNITY)
Admission: EM | Admit: 2014-11-21 | Discharge: 2014-11-21 | Discharge status: Against Medical Advice | Payer: Managed Care, Other (non HMO) | Attending: Emergency Medicine | Admitting: Emergency Medicine

## 2014-11-21 ENCOUNTER — Encounter (HOSPITAL_COMMUNITY): Payer: Self-pay | Admitting: Emergency Medicine

## 2014-11-21 ENCOUNTER — Telehealth: Payer: Self-pay | Admitting: Family Medicine

## 2014-11-21 DIAGNOSIS — R51 Headache: Secondary | ICD-10-CM | POA: Diagnosis not present

## 2014-11-21 DIAGNOSIS — I10 Essential (primary) hypertension: Secondary | ICD-10-CM | POA: Insufficient documentation

## 2014-11-21 NOTE — ED Notes (Signed)
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

## 2014-11-21 NOTE — Telephone Encounter (Signed)
[facility] - Client TELEPHONE ADVICE RECORD [facility]  Patient Name: Sandra Vang  DOB: 02/01/1976    Initial Comment BP 147/87, headache, nausea   Nurse Assessment  Nurse: Phylliss Bobowe, RN, Synetta FailAnita Date/Time (Eastern Time): 11/21/2014 4:52:30 PM  Confirm and document reason for call. If symptomatic, describe symptoms. ---Caller stated that she has been having a headache since last week and has been haivng BP issues at times and has been nauseated feeling but no vomiting and has had some dizziness BP 147/87 at present  Has the patient traveled out of the country within the last 30 days? ---No  Does the patient require triage? ---Yes  Related visit to physician within the last 2 weeks? ---No  Does the PT have any chronic conditions? (i.e. diabetes, asthma, etc.) ---Yes  List chronic conditions. ---HTN fibromy;lagia  Did the patient indicate they were pregnant? ---No     Guidelines    Guideline Title Affirmed Question Affirmed Notes  Headache Severe pain in one eye    Final Disposition User   Go to ED Now Phylliss Bobowe, RN, Synetta FailAnita

## 2014-11-21 NOTE — ED Notes (Signed)
Per pt, states BP high for past week, causing headache

## 2014-11-22 NOTE — Telephone Encounter (Signed)
Agree needs OV to eval. Thanks.

## 2014-11-22 NOTE — Telephone Encounter (Signed)
I called the pt and scheduled an appt for tomorrow at 8am.

## 2014-11-22 NOTE — Telephone Encounter (Signed)
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  I left a message for pt to advise that she will need to schedule an OV to discuss sxs and medication options with Dr. Selena BattenKim.  Called pt's cell and she states she will call back because she is working

## 2014-11-23 ENCOUNTER — Ambulatory Visit: Payer: Managed Care, Other (non HMO) | Admitting: Family Medicine

## 2014-11-24 ENCOUNTER — Ambulatory Visit (INDEPENDENT_AMBULATORY_CARE_PROVIDER_SITE_OTHER): Payer: Managed Care, Other (non HMO) | Admitting: Family Medicine

## 2014-11-24 ENCOUNTER — Ambulatory Visit: Payer: Managed Care, Other (non HMO) | Admitting: Family Medicine

## 2014-11-24 DIAGNOSIS — R69 Illness, unspecified: Secondary | ICD-10-CM

## 2014-11-24 NOTE — Progress Notes (Signed)
No show

## 2014-11-25 ENCOUNTER — Encounter: Payer: Self-pay | Admitting: Family

## 2014-12-01 ENCOUNTER — Encounter: Payer: Self-pay | Admitting: Family Medicine

## 2014-12-01 ENCOUNTER — Ambulatory Visit (INDEPENDENT_AMBULATORY_CARE_PROVIDER_SITE_OTHER): Payer: Managed Care, Other (non HMO) | Admitting: Family Medicine

## 2014-12-01 ENCOUNTER — Other Ambulatory Visit (HOSPITAL_COMMUNITY)
Admission: RE | Admit: 2014-12-01 | Discharge: 2014-12-01 | Disposition: A | Discharge status: Home / Self Care | Payer: Managed Care, Other (non HMO) | Source: Ambulatory Visit | Attending: Family Medicine | Admitting: Family Medicine

## 2014-12-01 VITALS — BP 122/86 | HR 99 | Temp 98.9°F | Ht 60.75 in | Wt 190.5 lb

## 2014-12-01 DIAGNOSIS — N898 Other specified noninflammatory disorders of vagina: Secondary | ICD-10-CM | POA: Diagnosis not present

## 2014-12-01 DIAGNOSIS — R0683 Snoring: Secondary | ICD-10-CM

## 2014-12-01 DIAGNOSIS — R400 Somnolence: Secondary | ICD-10-CM

## 2014-12-01 DIAGNOSIS — G4710 Hypersomnia, unspecified: Secondary | ICD-10-CM | POA: Diagnosis not present

## 2014-12-01 DIAGNOSIS — N760 Acute vaginitis: Secondary | ICD-10-CM | POA: Insufficient documentation

## 2014-12-01 DIAGNOSIS — I10 Essential (primary) hypertension: Secondary | ICD-10-CM | POA: Diagnosis not present

## 2014-12-01 DIAGNOSIS — E669 Obesity, unspecified: Secondary | ICD-10-CM

## 2014-12-01 DIAGNOSIS — Z113 Encounter for screening for infections with a predominantly sexual mode of transmission: Secondary | ICD-10-CM | POA: Insufficient documentation

## 2014-12-01 MED ORDER — HYDROCHLOROTHIAZIDE 25 MG PO TABS: 25.0000 mg | ORAL_TABLET | Freq: Every day | ORAL | Status: DC

## 2014-12-01 NOTE — Progress Notes (Signed)
HPI:  Acute visit for:  HTN: -meds lisinopril-hctz 20-12.5 -reports: she may want to consider changing BP medication after reading about side effects but is unsure -after discussion wants to stop combo and increase diuretic as has some LE edema at times -denies: CP, SOB, DOE  Vaginal discharge: -started: about a week ago -white discharge, mild odor -treated with OTC yeast medication -denies: pelvic pain, fevers, dysuria, NVD  Concern for OSA: -snores very loud -feels tired during the day, unrestful sleep -her husband told her she has apneic spells in her sleep -wants referral to the sleep specialist  ROS: See pertinent positives and negatives per HPI.  Past Medical History  Diagnosis Date  . Fibromyalgia   . Depression   . Insomnia   . Hypertension   . MRSA (methicillin resistant Staphylococcus aureus) infection     Past Surgical History  Procedure Laterality Date  . Appendectomy    . Cesarean section    . Tubal ligation      Family History  Problem Relation Age of Onset  . Alcohol abuse Mother   . Hypertension Mother   . Drug abuse Father   . Hypertension Father   . Depression Paternal Aunt   . Depression Paternal Uncle   . Alcohol abuse Maternal Grandmother   . Arthritis Maternal Grandmother   . Cancer Maternal Grandmother   . Hypertension Maternal Grandmother   . Alcohol abuse Maternal Grandfather   . Hypertension Maternal Grandfather   . Arthritis Paternal Grandmother   . Cancer Paternal Grandmother   . Hypertension Paternal Grandmother   . Hypertension Paternal Grandfather     History   Social History  . Marital Status: Legally Separated    Spouse Name: N/A  . Number of Children: N/A  . Years of Education: N/A   Social History Main Topics  . Smoking status: Never Smoker   . Smokeless tobacco: Not on file  . Alcohol Use: No  . Drug Use: No  . Sexual Activity: Not on file   Other Topics Concern  . 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  cetirizine (ZYRTEC) 10 MG tablet, Take 10 mg by mouth daily., Disp: , Rfl:  .  ibuprofen (ADVIL,MOTRIN) 600 MG tablet, Take 1 tablet (600 mg total) by mouth every 6 (six) hours as needed for pain., Disp: 30 tablet, Rfl: 0 .  hydrochlorothiazide (HYDRODIURIL) 25 MG tablet, Take 1 tablet (25 mg total) by mouth daily., Disp: 90 tablet, Rfl: 3  EXAM:  Filed Vitals:   12/01/14 1008  BP: 122/86  Pulse: 99  Temp: 98.9 F (37.2 C)    Body mass index is 36.29 kg/(m^2).  GENERAL: vitals reviewed and listed above, alert, oriented, appears well hydrated and in no acute distress  HEENT: atraumatic, conjunttiva clear, no obvious abnormalities on inspection of external nose and ears  NECK: no obvious masses on inspection  LUNGS: clear to auscultation bilaterally, no wheezes, rales or rhonchi, good air movement  CV: HRRR, no peripheral edema  GU: normal appearance ext and speculum exa, sl amount of white discharge, no CMT  MS: moves all extremities without noticeable abnormality  PSYCH: pleasant and  cooperative, no obvious depression or anxiety  ASSESSMENT AND PLAN:  Discussed the following assessment and plan:  Essential hypertension - Plan: hydrochlorothiazide (HYDRODIURIL) 25 MG tablet -stop combo, she is concerned for side effect with acei - not having any side effects -increase diuretic -follow up in 1 month  Obesity -lifestyle recs  Vaginal discharge -BV, yeast, GC/chlam, tric pending -exam benign  Daytime somnolence - Plan: Ambulatory referral to Pulmonology Snoring - Plan: Ambulatory referral to Pulmonology  -Patient advised to return or notify a doctor immediately if symptoms worsen or persist or new concerns  arise.  Patient Instructions  BEFORE YOU LEAVE: -schedule follow up in 1 month  Stop the combination medication  Start the hydrochlorothiazide 25 mg daily  We placed a referral for you as discussed to the sleep specialist. It usually takes about 1-2 weeks to process and schedule this referral. If you have not heard from us regarding this appointment in 2 weeks please contact our office.  -We have ordered labs or studies at this visit. It can take up to 1-2 weeks for results and processing. We will contact you with instructions IF your results are abnormal. Normal results will be released to your [facility]. If you have not heard from [place] or can not find your results in [facility] in 2 weeks please contact our office   We recommend the following healthy lifestyle measures: - eat a healthy diet consisting of lots of vegetables, fruits, beans, nuts, seeds, healthy meats such as white chicken and fish and whole grains.  - avoid fried foods, fast food, processed foods, sodas, red meet and other fattening foods.  - get a least 150 minutes of aerobic exercise per week.       Kriste BasqueKIM, HANNAH R.

## 2014-12-01 NOTE — Addendum Note (Signed)
Addended by: Johnella MoloneyFUNDERBURK, Jayan Raymundo A on: 12/01/2014 10:48 AM   Modules accepted: Orders

## 2014-12-01 NOTE — Patient Instructions (Addendum)
BEFORE YOU LEAVE: -schedule follow up in 1 month  Stop the combination medication  Start the hydrochlorothiazide 25 mg daily  We placed a referral for you as discussed to the sleep specialist. It usually takes about 1-2 weeks to process and schedule this referral. If you have not heard from us regarding this appointment in 2 weeks please contact our office.  -We have ordered labs or studies at this visit. It can take up to 1-2 weeks for results and processing. We will contact you with instructions IF your results are abnormal. Normal results will be released to your [facility]. If you have not heard from us or can not find your results in [facility] in 2 weeks please contact our office   We recommend the following healthy lifestyle measures: - eat a healthy diet consisting of lots of vegetables, fruits, beans, nuts, seeds, healthy meats such as white chicken and fish and whole grains.  - avoid fried foods, fast food, processed foods, sodas, red meet and other fattening foods.  - get a least 150 minutes of aerobic exercise per week.

## 2014-12-01 NOTE — Progress Notes (Signed)
Pre visit review using our clinic review tool, if applicable. No additional management support is needed unless otherwise documented below in the visit note. 

## 2014-12-02 LAB — CERVICOVAGINAL ANCILLARY ONLY
Chlamydia: NEGATIVE
Neisseria Gonorrhea: NEGATIVE
TRICH (WINDOWPATH): NEGATIVE

## 2014-12-05 LAB — CERVICOVAGINAL ANCILLARY ONLY
BACTERIAL VAGINITIS: POSITIVE — AB
CANDIDA VAGINITIS: NEGATIVE

## 2014-12-06 MED ORDER — FLUCONAZOLE 150 MG PO TABS: ORAL_TABLET | ORAL | Status: DC

## 2014-12-06 MED ORDER — METRONIDAZOLE 500 MG PO TABS: 500.0000 mg | ORAL_TABLET | Freq: Two times a day (BID) | ORAL | Status: DC

## 2014-12-06 NOTE — Addendum Note (Signed)
Addended by: Johnella MoloneyFUNDERBURK, JO A on: 12/06/2014 11:27 AM   Modules accepted: Orders

## 2014-12-29 ENCOUNTER — Ambulatory Visit (INDEPENDENT_AMBULATORY_CARE_PROVIDER_SITE_OTHER): Payer: Managed Care, Other (non HMO) | Admitting: Psychology

## 2014-12-29 ENCOUNTER — Encounter: Payer: Self-pay | Admitting: Family Medicine

## 2014-12-29 DIAGNOSIS — F4323 Adjustment disorder with mixed anxiety and depressed mood: Secondary | ICD-10-CM

## 2015-01-05 ENCOUNTER — Ambulatory Visit: Payer: Managed Care, Other (non HMO) | Admitting: Psychology

## 2015-01-05 ENCOUNTER — Ambulatory Visit (INDEPENDENT_AMBULATORY_CARE_PROVIDER_SITE_OTHER): Payer: Self-pay | Admitting: Family Medicine

## 2015-01-05 DIAGNOSIS — R69 Illness, unspecified: Secondary | ICD-10-CM

## 2015-01-05 NOTE — Progress Notes (Signed)
NO SHOW. On [facility] prior to visit:  HTN: -meds: increased diuretic 11/2014 and stopped lisinopril after she read about side effects and did not want to take, hctz 25mg  -denies:   Concern for OSA: -snores very loud -feels tired during the day, unrestful sleep -her husband told her she has apneic spells in her sleep -wants referral to the sleep specialist - referral placed 12/01/14  Depression/Insomnia/Poor focus: -seeing Dr. Jason FilaBray for CBT and wants to undergo ADD testing -plans to see psych for medication management if has ADD -denies:

## 2015-01-06 ENCOUNTER — Emergency Department (HOSPITAL_COMMUNITY): Payer: Managed Care, Other (non HMO)

## 2015-01-06 ENCOUNTER — Encounter (HOSPITAL_COMMUNITY): Payer: Self-pay

## 2015-01-06 ENCOUNTER — Emergency Department (HOSPITAL_COMMUNITY)
Admission: EM | Admit: 2015-01-06 | Discharge: 2015-01-06 | Disposition: A | Discharge status: Home / Self Care | Payer: Managed Care, Other (non HMO) | Attending: Emergency Medicine | Admitting: Emergency Medicine

## 2015-01-06 DIAGNOSIS — Z8669 Personal history of other diseases of the nervous system and sense organs: Secondary | ICD-10-CM | POA: Diagnosis not present

## 2015-01-06 DIAGNOSIS — Z79899 Other long term (current) drug therapy: Secondary | ICD-10-CM | POA: Diagnosis not present

## 2015-01-06 DIAGNOSIS — Y999 Unspecified external cause status: Secondary | ICD-10-CM | POA: Diagnosis not present

## 2015-01-06 DIAGNOSIS — X58XXXA Exposure to other specified factors, initial encounter: Secondary | ICD-10-CM | POA: Diagnosis not present

## 2015-01-06 DIAGNOSIS — R0789 Other chest pain: Secondary | ICD-10-CM | POA: Insufficient documentation

## 2015-01-06 DIAGNOSIS — Z8659 Personal history of other mental and behavioral disorders: Secondary | ICD-10-CM | POA: Insufficient documentation

## 2015-01-06 DIAGNOSIS — Y939 Activity, unspecified: Secondary | ICD-10-CM | POA: Diagnosis not present

## 2015-01-06 DIAGNOSIS — Z8614 Personal history of Methicillin resistant Staphylococcus aureus infection: Secondary | ICD-10-CM | POA: Insufficient documentation

## 2015-01-06 DIAGNOSIS — M25512 Pain in left shoulder: Secondary | ICD-10-CM | POA: Insufficient documentation

## 2015-01-06 DIAGNOSIS — Y929 Unspecified place or not applicable: Secondary | ICD-10-CM | POA: Insufficient documentation

## 2015-01-06 DIAGNOSIS — I10 Essential (primary) hypertension: Secondary | ICD-10-CM | POA: Insufficient documentation

## 2015-01-06 DIAGNOSIS — S199XXA Unspecified injury of neck, initial encounter: Secondary | ICD-10-CM | POA: Diagnosis present

## 2015-01-06 DIAGNOSIS — S161XXA Strain of muscle, fascia and tendon at neck level, initial encounter: Secondary | ICD-10-CM | POA: Insufficient documentation

## 2015-01-06 LAB — CBC
HCT: 40.8 % (ref 36.0–46.0)
HEMOGLOBIN: 13.7 g/dL (ref 12.0–15.0)
MCH: 28.9 pg (ref 26.0–34.0)
MCHC: 33.6 g/dL (ref 30.0–36.0)
MCV: 86.1 fL (ref 78.0–100.0)
Platelets: 405 10*3/uL — ABNORMAL HIGH (ref 150–400)
RBC: 4.74 MIL/uL (ref 3.87–5.11)
RDW: 13 % (ref 11.5–15.5)
WBC: 9.4 10*3/uL (ref 4.0–10.5)

## 2015-01-06 LAB — BASIC METABOLIC PANEL
Anion gap: 10 (ref 5–15)
BUN: 15 mg/dL (ref 6–20)
CALCIUM: 9.6 mg/dL (ref 8.9–10.3)
CO2: 27 mmol/L (ref 22–32)
CREATININE: 0.87 mg/dL (ref 0.44–1.00)
Chloride: 99 mmol/L — ABNORMAL LOW (ref 101–111)
GFR calc Af Amer: >60 mL/min (ref >60)
GFR calc non Af Amer: >60 mL/min (ref >60)
Glucose, Bld: 98 mg/dL (ref 65–99)
POTASSIUM: 4 mmol/L (ref 3.5–5.1)
SODIUM: 136 mmol/L (ref 135–145)

## 2015-01-06 LAB — I-STAT TROPONIN, ED: TROPONIN I, POC: 0 ng/mL (ref 0.00–0.08)

## 2015-01-06 MED ORDER — HYDROCODONE-ACETAMINOPHEN 5-325 MG PO TABS: 1.0000 | ORAL_TABLET | Freq: Four times a day (QID) | ORAL | Status: DC | PRN

## 2015-01-06 MED ORDER — KETOROLAC TROMETHAMINE 30 MG/ML IJ SOLN
30.0000 mg | Freq: Once | INTRAMUSCULAR | Status: AC
  Administered 2015-01-06: 30 mg via INTRAVENOUS
  Filled 2015-01-06: qty 1

## 2015-01-06 MED ORDER — PREDNISONE 50 MG PO TABS: 50.0000 mg | ORAL_TABLET | Freq: Every day | ORAL | Status: DC

## 2015-01-06 NOTE — ED Notes (Signed)
Iv access attempted x1 unsuccessful. Will have another RN assess

## 2015-01-06 NOTE — ED Provider Notes (Signed)
CSN: [id number]     Arrival date & time 01/06/15  1504 History   First MD Initiated Contact with Patient 01/06/15 1519     Chief Complaint  Patient presents with  . Chest Pain  . Neck Pain     (Consider location/radiation/quality/duration/timing/severity/associated sxs/prior Treatment) HPI Patient presents to the emergency department with left sided neck pain that started this morning and she started noticing she is having pain in between her shoulder blades mainly on the left and some left upper chest pain as well.  Patient states that she does have some pain into her upper arm.  She states that movement makes the pain worse.  She states that she has not taken any medications prior to arrival for her symptoms.  Patient denies shortness of breath, nausea, vomiting, weakness, dizziness, headache, blurred vision, cough, fever, abdominal pain, extremity edema, lightheadedness, near syncope or syncope.  The patient states that she does have a history of hypertension but no diabetes.  The patient states that she is a nonsmoker Past Medical History  Diagnosis Date  . Fibromyalgia   . Depression   . Insomnia   . Hypertension   . MRSA (methicillin resistant Staphylococcus aureus) infection    Past Surgical History  Procedure Laterality Date  . Appendectomy    . Cesarean section    . Tubal ligation     Family History  Problem Relation Age of Onset  . Alcohol abuse Mother   . Hypertension Mother   . Drug abuse Father   . Hypertension Father   . Depression Paternal Aunt   . Depression Paternal Uncle   . Alcohol abuse Maternal Grandmother   . Arthritis Maternal Grandmother   . Cancer Maternal Grandmother   . Hypertension Maternal Grandmother   . Alcohol abuse Maternal Grandfather   . Hypertension Maternal Grandfather   . Arthritis Paternal Grandmother   . Cancer Paternal Grandmother   . Hypertension Paternal Grandmother   . Hypertension Paternal Grandfather    History  Substance Use  Topics  . Smoking status: Never Smoker   . Smokeless tobacco: Not on file  . Alcohol Use: No   OB History    No data available     Review of Systems  All other systems negative except as documented in the HPI. All pertinent positives and negatives as reviewed in the HPI.  Allergies  Dexamethasone; Iohexol; Ivp dye; and Sulfa antibiotics  Home Medications   Prior to Admission medications   Medication Sig Start Date End Date Taking? Authorizing Provider  cetirizine (ZYRTEC) 10 MG tablet Take 10 mg by mouth daily.    Historical Provider, MD  fluconazole (DIFLUCAN) 150 MG tablet Take one by mouth at once, may repeat in 1 week if needed 12/06/14   Terressa Koyanagi, DO  hydrochlorothiazide (HYDRODIURIL) 25 MG tablet Take 1 tablet (25 mg total) by mouth daily. 12/01/14   Terressa Koyanagi, DO  ibuprofen (ADVIL,MOTRIN) 600 MG tablet Take 1 tablet (600 mg total) by mouth every 6 (six) hours as needed for pain. 04/12/12   Johnsie Kindred, NP  metroNIDAZOLE (FLAGYL) 500 MG tablet Take 1 tablet (500 mg total) by mouth 2 (two) times daily. 12/06/14   Terressa Koyanagi, DO   BP 139/84 mmHg  Pulse 98  Temp(Src) 98.3 F (36.8 C) (Oral)  Resp 18  SpO2 100%  LMP 01/06/2015 (Approximate) Physical Exam  Constitutional: She is oriented to person, place, and time. She appears well-developed and well-nourished. No distress.  HENT:  Head: Normocephalic and atraumatic.  Mouth/Throat: Oropharynx is clear and moist.  Eyes: Pupils are equal, round, and reactive to light.  Neck: Normal range of motion. Neck supple.  Cardiovascular: Normal rate, regular rhythm and normal heart sounds.  Exam reveals no gallop and no friction rub.   No murmur heard. Pulmonary/Chest: Effort normal and breath sounds normal. No respiratory distress. She has no wheezes. She has no rales. She exhibits no tenderness.  Neurological: She is alert and oriented to person, place, and time. She exhibits normal muscle tone. Coordination normal.  Skin:  Skin is warm and dry. No rash noted. No erythema.  Nursing note and vitals reviewed.   ED Course  Procedures (including critical care time) Labs Review Labs Reviewed  CBC - Abnormal; Notable for the following:    Platelets 405 (*)    All other components within normal limits  BASIC METABOLIC PANEL - Abnormal; Notable for the following:    Chloride 99 (*)    All other components within normal limits  I-STAT TROPOININ, ED  Rosezena SensorI-STAT TROPOININ, ED    Imaging Review Dg Chest 2 View  01/06/2015   CLINICAL DATA:  Left chest and neck pain  EXAM: CHEST  2 VIEW  COMPARISON:  May 07, 2010  FINDINGS: Lungs are clear. Heart size and pulmonary vascularity are normal. No adenopathy. No pneumothorax or pneumomediastinum. No bone lesions.  IMPRESSION: No abnormality noted.   Electronically Signed   By: Bretta BangWilliam  Woodruff III M.D.   On: 01/06/2015 16:30     EKG Interpretation   Date/Time:  Friday January 06 2015 15:26:12 EDT Ventricular Rate:  87 PR Interval:  148 QRS Duration: 81 QT Interval:  352 QTC Calculation: 423 R Axis:   66 Text Interpretation:  Sinus rhythm ST elev, probable normal early repol  pattern No significant change since last tracing Confirmed by [facility]  MD,  Kynan Peasley [phone]([id number]) on 01/06/2015 4:11:59 PM       I feel that the patient's pain is related to muscular strain in the neck as it radiates down between her shoulder blades for the left side of her neck to her left upper shoulder and chest discomfort.  Patient agrees the plan and all questions were answered.  I advised her to follow-up with her primary care doctor.  She agrees to this.  Told to return here for any worsening in her condition  Charlestine NightChristopher Kelvin Sennett, PA-C 01/06/15 1947  Lorre NickAnthony Allen, MD 01/06/15 [phone]

## 2015-01-06 NOTE — ED Notes (Signed)
Pt presents with c/o chest pain on that left side of her chest that radiates down her left arm as well. Pt reports that she is also experiencing neck pain that hurts more when she turns to her left. Pt reports she started experiencing these symptoms this morning as well as some dizziness.

## 2015-01-06 NOTE — Discharge Instructions (Signed)
Return here as needed.  Follow-up with your primary care Dr.Your heart tests were normal

## 2015-01-09 LAB — I-STAT TROPONIN, ED: Troponin i, poc: 0 ng/mL (ref 0.00–0.08)

## 2015-01-13 ENCOUNTER — Ambulatory Visit: Payer: Managed Care, Other (non HMO) | Admitting: Psychology

## 2015-02-02 ENCOUNTER — Encounter (HOSPITAL_COMMUNITY): Payer: Self-pay | Admitting: *Deleted

## 2015-02-02 ENCOUNTER — Emergency Department (HOSPITAL_COMMUNITY)
Admission: EM | Admit: 2015-02-02 | Discharge: 2015-02-02 | Disposition: A | Discharge status: Home / Self Care | Payer: Managed Care, Other (non HMO) | Attending: Emergency Medicine | Admitting: Emergency Medicine

## 2015-02-02 ENCOUNTER — Emergency Department (HOSPITAL_COMMUNITY): Payer: Managed Care, Other (non HMO)

## 2015-02-02 DIAGNOSIS — Z8669 Personal history of other diseases of the nervous system and sense organs: Secondary | ICD-10-CM | POA: Diagnosis not present

## 2015-02-02 DIAGNOSIS — Z79899 Other long term (current) drug therapy: Secondary | ICD-10-CM | POA: Diagnosis not present

## 2015-02-02 DIAGNOSIS — L03032 Cellulitis of left toe: Secondary | ICD-10-CM | POA: Insufficient documentation

## 2015-02-02 DIAGNOSIS — I10 Essential (primary) hypertension: Secondary | ICD-10-CM | POA: Diagnosis not present

## 2015-02-02 DIAGNOSIS — Z8659 Personal history of other mental and behavioral disorders: Secondary | ICD-10-CM | POA: Insufficient documentation

## 2015-02-02 DIAGNOSIS — Z8614 Personal history of Methicillin resistant Staphylococcus aureus infection: Secondary | ICD-10-CM | POA: Insufficient documentation

## 2015-02-02 DIAGNOSIS — M79675 Pain in left toe(s): Secondary | ICD-10-CM | POA: Diagnosis present

## 2015-02-02 MED ORDER — CEPHALEXIN 500 MG PO CAPS
500.0000 mg | ORAL_CAPSULE | Freq: Once | ORAL | Status: AC
  Administered 2015-02-02: 500 mg via ORAL
  Filled 2015-02-02: qty 1

## 2015-02-02 MED ORDER — MELOXICAM 15 MG PO TABS: 15.0000 mg | ORAL_TABLET | Freq: Every day | ORAL | Status: DC

## 2015-02-02 MED ORDER — NAPROXEN 500 MG PO TABS
500.0000 mg | ORAL_TABLET | Freq: Once | ORAL | Status: AC
  Administered 2015-02-02: 500 mg via ORAL
  Filled 2015-02-02: qty 1

## 2015-02-02 MED ORDER — CEPHALEXIN 500 MG PO CAPS: 500.0000 mg | ORAL_CAPSULE | Freq: Two times a day (BID) | ORAL | Status: DC

## 2015-02-02 NOTE — ED Provider Notes (Signed)
History  This chart was scribed for non-physician practitioner, Emilia Beck, PA-C,working with Mancel Bale, MD, by Karle Plumber, ED Scribe. This patient was seen in room WTR6/WTR6 and the patient's care was started at 9:55 PM.  Chief Complaint  Patient presents with  . Toe Pain   The history is provided by the patient and medical records. No language interpreter was used.    HPI Comments:  Sandra Vang is a 39 y.o. female who presents to the Emergency Department complaining of severe left second throbbing toe pain and stiffness that began last night. She reports associated swelling and mild itching. She has taken Ibuprofen and has applied Vick's vapor rub. She denies modifying factors. She denies fever, chills, nausea, vomiting, bruising or wounds. She is unsure if anything bit her. Denies h/o gout. Reports allergy to Sulfa medications.  Past Medical History  Diagnosis Date  . Fibromyalgia   . Depression   . Insomnia   . Hypertension   . MRSA (methicillin resistant Staphylococcus aureus) infection    Past Surgical History  Procedure Laterality Date  . Appendectomy    . Cesarean section    . Tubal ligation     Family History  Problem Relation Age of Onset  . Alcohol abuse Mother   . Hypertension Mother   . Drug abuse Father   . Hypertension Father   . Depression Paternal Aunt   . Depression Paternal Uncle   . Alcohol abuse Maternal Grandmother   . Arthritis Maternal Grandmother   . Cancer Maternal Grandmother   . Hypertension Maternal Grandmother   . Alcohol abuse Maternal Grandfather   . Hypertension Maternal Grandfather   . Arthritis Paternal Grandmother   . Cancer Paternal Grandmother   . Hypertension Paternal Grandmother   . Hypertension Paternal Grandfather    History  Substance Use Topics  . Smoking status: Never Smoker   . Smokeless tobacco: Not on file  . Alcohol Use: No   OB History    No data available     Review of Systems   Musculoskeletal: Positive for arthralgias.  All other systems reviewed and are negative.   Allergies  Dexamethasone; Iohexol; Ivp dye; and Sulfa antibiotics  Home Medications   Prior to Admission medications   Medication Sig Start Date End Date Taking? Authorizing Provider  cetirizine (ZYRTEC) 10 MG tablet Take 10 mg by mouth at bedtime.    Yes Historical Provider, MD  Cyanocobalamin (VITAMIN B-12) 5000 MCG SUBL Place 1 tablet under the tongue daily.   Yes Historical Provider, MD  hydrochlorothiazide (HYDRODIURIL) 25 MG tablet Take 1 tablet (25 mg total) by mouth daily. 12/01/14  Yes Terressa Koyanagi, DO  Ibuprofen-Diphenhydramine Cit (ADVIL PM) 200-38 MG TABS Take 2 tablets by mouth daily as needed (sleep).   Yes Historical Provider, MD  fluconazole (DIFLUCAN) 150 MG tablet Take one by mouth at once, may repeat in 1 week if needed Patient not taking: Reported on 01/06/2015 12/06/14   Terressa Koyanagi, DO  HYDROcodone-acetaminophen (NORCO/VICODIN) 5-325 MG per tablet Take 1 tablet by mouth every 6 (six) hours as needed for moderate pain. Patient not taking: Reported on 02/02/2015 01/06/15   Charlestine Night, PA-C  ibuprofen (ADVIL,MOTRIN) 600 MG tablet Take 1 tablet (600 mg total) by mouth every 6 (six) hours as needed for pain. Patient not taking: Reported on 01/06/2015 04/12/12   Johnsie Kindred, NP  metroNIDAZOLE (FLAGYL) 500 MG tablet Take 1 tablet (500 mg total) by mouth 2 (two) times daily. Patient not  taking: Reported on 01/06/2015 12/06/14   Terressa Koyanagi, DO  predniSONE (DELTASONE) 50 MG tablet Take 1 tablet (50 mg total) by mouth daily with breakfast. Patient not taking: Reported on 02/02/2015 01/06/15   Charlestine Night, PA-C   Triage Vitals: BP 135/57 mmHg  Pulse 92  Temp(Src) 99.1 F (37.3 C) (Oral)  Resp 17  SpO2 98%  LMP 01/12/2015 Physical Exam  Constitutional: She is oriented to person, place, and time. She appears well-developed and well-nourished. No distress.  HENT:  Head:  Normocephalic and atraumatic.  Eyes: Conjunctivae are normal.  Normal appearance  Neck: Normal range of motion.  Cardiovascular: Normal rate and regular rhythm.  Exam reveals no gallop and no friction rub.   No murmur heard. Pulmonary/Chest: Effort normal and breath sounds normal. She has no wheezes. She has no rales. She exhibits no tenderness.  Abdominal: Soft. There is no tenderness.  Musculoskeletal: Normal range of motion. She exhibits tenderness.  Left second toe swollen and erythematous. Tender to palpation. Skin is warm. No obvious deformity. No streaking.  Neurological: She is alert and oriented to person, place, and time.  Speech is goal-oriented. Moves limbs without ataxia.   Skin: Skin is warm and dry.  Psychiatric: She has a normal mood and affect. Her behavior is normal.  Nursing note and vitals reviewed.   ED Course  Procedures (including critical care time) DIAGNOSTIC STUDIES: Oxygen Saturation is 98% on RA, normal by my interpretation.   COORDINATION OF CARE: 9:59 PM- Will prescribe Keflex and Mobic. Will order Naproxen prior to discharge. Pt verbalizes understanding and agrees to plan.  Medications - No data to display  Labs Review Labs Reviewed - No data to display  Imaging Review Dg Toe 2nd Left  02/02/2015   CLINICAL DATA:  Left second toe pain and swelling, onset last night. No known trauma.  EXAM: LEFT SECOND TOE  COMPARISON:  None.  FINDINGS: There is no evidence of fracture or dislocation. There is no evidence of arthropathy or other focal bone abnormality. Soft tissues are unremarkable.  IMPRESSION: Negative.   Electronically Signed   By: Ellery Plunk M.D.   On: 02/02/2015 21:42     EKG Interpretation None      MDM   Final diagnoses:  Cellulitis of toe of left foot    Patient will be treated for cellulitis with Keflex and patient will have mobic for pain. Vitals stable and patient afebrile.   I personally performed the services described  in this documentation, which was scribed in my presence. The recorded information has been reviewed and is accurate.    [address] [place], PA-C 02/03/15 2130  Mancel Bale, MD 02/03/15 2325

## 2015-02-02 NOTE — Discharge Instructions (Signed)
Take keflex as directed until gone. Take mobic as needed for pain. Refer to attached documents for more information.

## 2015-02-02 NOTE — ED Notes (Signed)
Pt reports L second toe pain and swelling since today.  Denies any injury at this time.

## 2015-02-13 ENCOUNTER — Ambulatory Visit (INDEPENDENT_AMBULATORY_CARE_PROVIDER_SITE_OTHER): Payer: Managed Care, Other (non HMO) | Admitting: Pulmonary Disease

## 2015-02-13 ENCOUNTER — Encounter: Payer: Self-pay | Admitting: Pulmonary Disease

## 2015-02-13 VITALS — BP 124/84 | HR 87 | Ht 61.0 in | Wt 191.8 lb

## 2015-02-13 DIAGNOSIS — G4733 Obstructive sleep apnea (adult) (pediatric): Secondary | ICD-10-CM | POA: Diagnosis not present

## 2015-02-13 NOTE — Progress Notes (Signed)
Chief Complaint  Patient presents with  . SLEEP CONSULT    Referred by Dr Selena Batten. Epworth Score: 12    History of Present Illness: Sandra Vang is a 39 y.o. female for evaluation of sleep problems.  She was seen by PCP recently.  She has been told she snores, and will stop breathing while asleep.  This has been going on for a while.  She feels tired during the day.  She will wake up hearing herself snore, and this happens more when she sleeps on her back.  She will occasionally grind her teeth.  She goes to sleep between 11 pm and 2 am.  She falls asleep after 45 minutes.  She wakes up 2 or 3 times to use the bathroom.  She gets out of bed at 730 am.  She feels tired in the morning.  She sometimes gets morning headache.  She does not currently use anything to help her fall sleep.  She tried remeron and [place] >> these helped, but she build up tolerance.  She tried [place] >> this helped her fall asleep, but then she would do things during the night w/o any memory of these the next day.  She has tried benadryl and advil PM >> no help.  She drinks coffee in the morning.  She also uses ginko biloba and vitamin B12 to get more energy during the day.  She could sleep all day if she was able to, but would still feel tired.   She denies sleep walking, sleep talking, or nightmares.  There is no history of restless legs.  She denies sleep hallucinations, sleep paralysis, or cataplexy.  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  The Epworth score is 12 out of 24.   Sandra Vang  has a past medical history of Fibromyalgia; Depression; Insomnia; Hypertension; MRSA (methicillin resistant Staphylococcus aureus) infection; and Chronic headaches.  Sandra Vang  has past surgical history that includes Appendectomy (2005); Cesarean section; and Tubal ligation (1998).  Prior to Admission medications   Medication Sig Start Date End Date Taking? Authorizing Provider  cephALEXin  (KEFLEX) 500 MG capsule Take 1 capsule (500 mg total) by mouth 2 (two) times daily. 02/02/15   Kaitlyn Szekalski, PA-C  cetirizine (ZYRTEC) 10 MG tablet Take 10 mg by mouth at bedtime.     Historical Provider, MD  Cyanocobalamin (VITAMIN B-12) 5000 MCG SUBL Place 1 tablet under the tongue daily.    Historical Provider, MD  fluconazole (DIFLUCAN) 150 MG tablet Take one by mouth at once, may repeat in 1 week if needed Patient not taking: Reported on 01/06/2015 12/06/14   Terressa Koyanagi, DO  hydrochlorothiazide (HYDRODIURIL) 25 MG tablet Take 1 tablet (25 mg total) by mouth daily. 12/01/14   Terressa Koyanagi, DO  ibuprofen (ADVIL,MOTRIN) 600 MG tablet Take 1 tablet (600 mg total) by mouth every 6 (six) hours as needed for pain. Patient not taking: Reported on 01/06/2015 04/12/12   Johnsie Kindred, NP  Ibuprofen-Diphenhydramine Cit (ADVIL PM) 200-38 MG TABS Take 2 tablets by mouth daily as needed (sleep).    Historical Provider, MD  meloxicam (MOBIC) 15 MG tablet Take 1 tablet (15 mg total) by mouth daily. 02/02/15   Emilia Beck, PA-C  predniSONE (DELTASONE) 50 MG tablet Take 1 tablet (50 mg total) by mouth daily with breakfast. Patient not taking: Reported on 02/02/2015 01/06/15   Charlestine Night, PA-C    Allergies  Allergen Reactions  . Dexamethasone  Severe vaginal and rectal burning  . Iohexol      Desc: HIVES   . Ivp Dye [Iodinated Diagnostic Agents]   . Sulfa Antibiotics Hives    Her family history includes Alcohol abuse in her maternal grandfather, maternal grandmother, and mother; Arthritis in her maternal grandmother and paternal grandmother; Asthma in her brother, daughter, daughter, and paternal grandmother; Cancer in her maternal grandmother and paternal grandmother; Depression in her paternal aunt and paternal uncle; Drug abuse in her father; Emphysema in her father; Hypertension in her father, maternal grandfather, maternal grandmother, mother, paternal grandfather, and paternal  grandmother.  She  reports that she has never smoked. She does not have any smokeless tobacco history on file. She reports that she does not drink alcohol or use illicit drugs.  Review of Systems  Constitutional: Negative for fever and unexpected weight change.  HENT: Negative for congestion, dental problem, ear pain, nosebleeds, postnasal drip, rhinorrhea, sinus pressure, sneezing, sore throat and trouble swallowing.   Eyes: Negative for redness and itching.  Respiratory: Positive for shortness of breath. Negative for cough, chest tightness and wheezing.   Cardiovascular: Negative for palpitations ( occassional palpitations) and leg swelling.  Gastrointestinal: Negative for nausea and vomiting.       Acid heartburn // indigestion  Genitourinary: Negative for dysuria.  Musculoskeletal: Positive for joint swelling and arthralgias.  Skin: Positive for rash ( itching).  Neurological: Positive for headaches.  Hematological: Does not bruise/bleed easily.  Psychiatric/Behavioral: Positive for dysphoric mood. The patient is nervous/anxious.    Physical Exam: BP 124/84 mmHg  Pulse 87  Ht  (1.549 m)  Wt 191 lb 12.8 oz (87 kg)  BMI 36.26 kg/m2  SpO2 98%  LMP 01/12/2015  General - No distress ENT - No sinus tenderness, no oral exudate, no LAN, no thyromegaly, TM clear, pupils equal/reactive, MP 3, 2+ tonsils Cardiac - s1s2 regular, no murmur, pulses symmetric Chest - No wheeze/rales/dullness, good air entry, normal respiratory excursion Back - No focal tenderness Abd - Soft, non-tender, no organomegaly, + bowel sounds Ext - No edema Neuro - Normal strength, cranial nerves intact Skin - No rashes Psych - Normal mood, and behavior  Discussion: She has snoring, sleep disruption, witnessed apnea, and daytime sleepiness.  She has hx of HTN, depression, fibromyalgia, and chronic headaches.  Her BMI is > 35.  I am concerned she could have sleep apnea.  We discussed how sleep apnea can  affect various health problems including risks for hypertension, cardiovascular disease, and diabetes.  We also discussed how sleep disruption can increase risks for accident, such as while driving.  Weight loss as a means of improving sleep apnea was also reviewed.  Additional treatment options discussed were CPAP therapy, oral appliance, and surgical intervention.  Assessment/plan:  Obstructive sleep apnea. Plan: - will arrange for home sleep study pending insurance approval  Obesity. Plan: - discussed importance of weight loss  Hypertension. Plan: - advised her to f/u with her PCP    Coralyn Helling, M.D. Pager [phone]

## 2015-02-13 NOTE — Progress Notes (Deleted)
   Subjective:    Patient ID: Sandra Vang, female    DOB: [birth year], 39 y.o.   MRN: [id number]  HPI    Review of Systems  Constitutional: Negative for fever and unexpected weight change.  HENT: Negative for congestion, dental problem, ear pain, nosebleeds, postnasal drip, rhinorrhea, sinus pressure, sneezing, sore throat and trouble swallowing.   Eyes: Negative for redness and itching.  Respiratory: Positive for shortness of breath. Negative for cough, chest tightness and wheezing.   Cardiovascular: Negative for palpitations ( occassional palpitations) and leg swelling.  Gastrointestinal: Negative for nausea and vomiting.       Acid heartburn // indigestion  Genitourinary: Negative for dysuria.  Musculoskeletal: Positive for joint swelling and arthralgias.  Skin: Positive for rash ( itching).  Neurological: Positive for headaches.  Hematological: Does not bruise/bleed easily.  Psychiatric/Behavioral: Positive for dysphoric mood. The patient is nervous/anxious.        Objective:   Physical Exam        Assessment & Plan:

## 2015-02-13 NOTE — Patient Instructions (Signed)
Will arrange for home sleep study Will call to arrange for follow up after sleep study reviewed  

## 2015-02-24 ENCOUNTER — Telehealth: Payer: Self-pay | Admitting: Pulmonary Disease

## 2015-02-24 DIAGNOSIS — G4733 Obstructive sleep apnea (adult) (pediatric): Secondary | ICD-10-CM | POA: Diagnosis not present

## 2015-02-24 NOTE — Telephone Encounter (Signed)
HST 02/24/15 >> AHI 11.4, SaO2 low 84%  Will have my nurse inform pt that sleep study showed mild sleep apnea.  She will need ROV to discuss tx options >> can be with me or Tammy Parrett.

## 2015-02-24 NOTE — Telephone Encounter (Signed)
LMTCB x 1 

## 2015-02-27 DIAGNOSIS — G4733 Obstructive sleep apnea (adult) (pediatric): Secondary | ICD-10-CM | POA: Diagnosis not present

## 2015-02-28 ENCOUNTER — Encounter: Payer: Self-pay | Admitting: Pulmonary Disease

## 2015-02-28 ENCOUNTER — Other Ambulatory Visit: Payer: Self-pay | Admitting: *Deleted

## 2015-02-28 DIAGNOSIS — G4733 Obstructive sleep apnea (adult) (pediatric): Secondary | ICD-10-CM

## 2015-02-28 NOTE — Telephone Encounter (Signed)
Patient returned call, [phone].

## 2015-02-28 NOTE — Telephone Encounter (Signed)
Called and spoke to pt. Informed her of the results and recs per VS. Appt made with TP on 9.23.16 (pt's first available). Pt verbalized understanding and denied any further questions or concerns at this time.

## 2015-03-24 ENCOUNTER — Encounter: Payer: Self-pay | Admitting: Adult Health

## 2015-03-24 ENCOUNTER — Telehealth: Payer: Self-pay | Admitting: Pulmonary Disease

## 2015-03-24 ENCOUNTER — Ambulatory Visit (INDEPENDENT_AMBULATORY_CARE_PROVIDER_SITE_OTHER): Payer: Managed Care, Other (non HMO) | Admitting: Adult Health

## 2015-03-24 VITALS — BP 116/74 | HR 98 | Temp 98.2°F | Ht 61.0 in | Wt 188.8 lb

## 2015-03-24 DIAGNOSIS — E669 Obesity, unspecified: Secondary | ICD-10-CM | POA: Diagnosis not present

## 2015-03-24 DIAGNOSIS — G4733 Obstructive sleep apnea (adult) (pediatric): Secondary | ICD-10-CM | POA: Insufficient documentation

## 2015-03-24 HISTORY — DX: Obstructive sleep apnea (adult) (pediatric): G47.33

## 2015-03-24 NOTE — Patient Instructions (Signed)
Begin CPAP At bedtime  .  Download in 1 month  Do not drive  If sleepy.  Work on weight loss.  Wear CPAP for at least 4-6 hrs each night .  Follow up with Dr. Sood  In 2 months and As needed   

## 2015-03-24 NOTE — Addendum Note (Signed)
Addended by: Karalee Height on: 03/24/2015 09:39 AM   Modules accepted: Orders

## 2015-03-24 NOTE — Telephone Encounter (Signed)
Yes

## 2015-03-24 NOTE — Progress Notes (Signed)
Reviewed and agree.

## 2015-03-24 NOTE — Telephone Encounter (Signed)
lmomtcb x1 for pt 

## 2015-03-24 NOTE — Progress Notes (Signed)
   Subjective:    Patient ID: Sandra Vang, female    DOB: 1976-02-01, 39 y.o.   MRN: [id number]  HPI 39 yo female seen for sleep consult for sleep issues on 02/13/15 .   03/24/2015 Follow up : OSA  Pt presents for a follow up . Seen last month for sleep issues with snoring , daytime sleepiness, and stopping breathing.  Had HST on 02/24/15 with AHI 11.4. SaO2 low 84% .  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  Has HTN.  We discussed her results and treatment options with wt loss, oral appliance , CPAP .  She would like to proceed with CPAP .  Denies chest pain, orthopnea, edema or fever.    Review of Systems Constitutional:   No  weight loss, night sweats,  Fevers, chills,  +fatigue, or  lassitude.  HEENT:   No headaches,  Difficulty swallowing,  Tooth/dental problems, or  Sore throat,                No sneezing, itching, ear ache, nasal congestion, post nasal drip,   CV:  No chest pain,  Orthopnea, PND, swelling in lower extremities, anasarca, dizziness, palpitations, syncope.   GI  No heartburn, indigestion, abdominal pain, nausea, vomiting, diarrhea, change in bowel habits, loss of appetite, bloody stools.   Resp: No shortness of breath with exertion or at rest.  No excess mucus, no productive cough,  No non-productive cough,  No coughing up of blood.  No change in color of mucus.  No wheezing.  No chest wall deformity  Skin: no rash or lesions.  GU: no dysuria, change in color of urine, no urgency or frequency.  No flank pain, no hematuria   MS:  No joint pain or swelling.  No decreased range of motion.  No back pain.  Psych:  No change in mood or affect. No depression or anxiety.  No memory loss.         Objective:   Physical Exam GEN: A/Ox3; pleasant , NAD, well nourished   HEENT:  [place]/AT,  EACs-clear, TMs-wnl, NOSE-clear, THROAT-clear, no lesions, no postnasal drip or exudate noted. Class 2 airway.   NECK:  Supple w/ fair ROM; no JVD; normal carotid impulses w/o bruits;  no thyromegaly or nodules palpated; no lymphadenopathy.  RESP  Clear  P & A; w/o, wheezes/ rales/ or rhonchi.no accessory muscle use, no dullness to percussion  CARD:  RRR, no m/r/g  , no peripheral edema, pulses intact, no cyanosis or clubbing.  GI:   Soft & nt; nml bowel sounds; no organomegaly or masses detected.  Musco: Warm bil, no deformities or joint swelling noted.   Neuro: alert, no focal deficits noted.    Skin: Warm, no lesions or rashes        Assessment & Plan:

## 2015-03-24 NOTE — Telephone Encounter (Signed)
Per 03/24/15 OV w/ TP: Follow-up and Disposition       Return in about 2 months (around 05/24/2015) for Follow up Dr. Craige Cotta.  --  Dr. Craige Cotta is it okay to overbook? thanks

## 2015-03-24 NOTE — Assessment & Plan Note (Signed)
Begin CPAP At bedtime  .  Download in 1 month  Do not drive  If sleepy.  Work on weight loss.  Wear CPAP for at least 4-6 hrs each night .  Follow up with Dr. Sood  In 2 months and As needed   

## 2015-03-24 NOTE — Assessment & Plan Note (Signed)
Begin CPAP At bedtime  .  Download in 1 month  Do not drive  If sleepy.  Work on weight loss.  Wear CPAP for at least 4-6 hrs each night .  Follow up with Dr. Craige Cotta  In 2 months and As needed

## 2015-03-27 NOTE — Telephone Encounter (Signed)
lmtcb x2 for pt. 

## 2015-03-27 NOTE — Telephone Encounter (Signed)
Pt cb, [phone]

## 2015-03-27 NOTE — Telephone Encounter (Signed)
Pt has been scheduled for 05/03/15 at 9am. Nothing further was needed.

## 2015-03-29 ENCOUNTER — Encounter: Payer: Self-pay | Admitting: Family Medicine

## 2015-04-17 ENCOUNTER — Encounter: Payer: Self-pay | Admitting: Family Medicine

## 2015-05-03 ENCOUNTER — Ambulatory Visit (INDEPENDENT_AMBULATORY_CARE_PROVIDER_SITE_OTHER): Payer: Managed Care, Other (non HMO) | Admitting: Pulmonary Disease

## 2015-05-03 ENCOUNTER — Encounter: Payer: Self-pay | Admitting: Pulmonary Disease

## 2015-05-03 VITALS — BP 118/82 | HR 89 | Ht 61.0 in | Wt 185.4 lb

## 2015-05-03 DIAGNOSIS — F5112 Insufficient sleep syndrome: Secondary | ICD-10-CM | POA: Diagnosis not present

## 2015-05-03 DIAGNOSIS — Z9989 Dependence on other enabling machines and devices: Principal | ICD-10-CM

## 2015-05-03 DIAGNOSIS — G4733 Obstructive sleep apnea (adult) (pediatric): Secondary | ICD-10-CM | POA: Diagnosis not present

## 2015-05-03 DIAGNOSIS — Z23 Encounter for immunization: Secondary | ICD-10-CM

## 2015-05-03 NOTE — Progress Notes (Signed)
Chief Complaint  Patient presents with  . Follow-up    Pt following for OSA: pt states she loves her CPAP.  pt sttaes sometimes she forgets to put it back on in the middle of the night if she wakes for the restroom. pt using CPAP almost everynight for atleast 6 hours. Mask and pressure good for pt. DME:  [facility]    History of Present Illness: Sandra Vang is a 39 y.o. female with OSA.  She has been using CPAP.  She feels her sleep is much better.  She is not getting enough sleep.  She has a demanding schedule with work and school.  She goes to bed at different times of night.  She will have trouble falling asleep.  She uses nasal pillow mask >> no issues with mask fit.   TESTS: HST 02/24/15 >> AHI 11.4, SaO2 low 84% Auto CPAP 04/02/15 to 05/01/15 >> used on 21 of 30 nights with average 6 hrs and 32 min.  Average AHI is 1 with median CPAP 7 cm H2O and 95 th percentile CPAP 9 cm H20.   PMhx >> Fibromyalgia, Depression, HTN, HA  Past surgical hx, Medications, Allergies, Family hx, Social hx all reviewed.   Physical Exam: BP 118/82 mmHg  Pulse 89  Ht 5\' 1"  (1.549 m)  Wt 185 lb 6.4 oz (84.097 kg)  BMI 35.05 kg/m2  SpO2 99%  General - No distress ENT - No sinus tenderness, no oral exudate, no LAN, MP 3, 2+ tonsils Cardiac - s1s2 regular, no murmur Chest - No wheeze/rales/dullness Back - No focal tenderness Abd - Soft, non-tender Ext - No edema Neuro - Normal strength Skin - No rashes Psych - normal mood, and behavior   Assessment/Plan:  Obstructive sleep apnea. She is compliant with therapy and reports benefit from CPAP. Plan: - continue auto CPAP  Insufficient sleep. Plan: - advised her to maintain regular sleep/wake schedule - stimulus control and relaxation techniques discussed  Obesity. Plan: - discussed importance of weight loss   Sandra HellingVineet Victoriya Pol, MD  Pulmonary/Critical Care/Sleep Pager:  [phone]

## 2015-05-03 NOTE — Patient Instructions (Signed)
Flu shot today Follow up in 1 year 

## 2015-05-04 ENCOUNTER — Ambulatory Visit (INDEPENDENT_AMBULATORY_CARE_PROVIDER_SITE_OTHER): Payer: Managed Care, Other (non HMO) | Admitting: Family Medicine

## 2015-05-04 ENCOUNTER — Encounter: Payer: Self-pay | Admitting: Family Medicine

## 2015-05-04 VITALS — BP 118/78 | HR 97 | Temp 98.5°F | Ht 61.0 in | Wt 185.2 lb

## 2015-05-04 DIAGNOSIS — I10 Essential (primary) hypertension: Secondary | ICD-10-CM

## 2015-05-04 DIAGNOSIS — E669 Obesity, unspecified: Secondary | ICD-10-CM

## 2015-05-04 DIAGNOSIS — M797 Fibromyalgia: Secondary | ICD-10-CM | POA: Diagnosis not present

## 2015-05-04 DIAGNOSIS — G4733 Obstructive sleep apnea (adult) (pediatric): Secondary | ICD-10-CM

## 2015-05-04 MED ORDER — MELOXICAM 15 MG PO TABS: 15.0000 mg | ORAL_TABLET | Freq: Every day | ORAL | Status: DC

## 2015-05-04 NOTE — Progress Notes (Signed)
Pre visit review using our clinic review tool, if applicable. No additional management support is needed unless otherwise documented below in the visit note. 

## 2015-05-04 NOTE — Progress Notes (Signed)
HPI:  Obesity: -going to curves -watching calories on fitness pal, lowering simple carbs -90-120 minutes CV exercise per week -had labs last week at work  HTN: -on lisinopril-hctz in the past, now hctz ; she wanted to stop the acei after reading about side effects -now wants to take acei and get epi pen in case she has reaction to this after reading about it in her clase -denies: CP, SOB, DOE, swelling, HA  Fibromyalgia -uses mobic occ as did not tolerate other medication for this -wants refill  -feels much better since started CPAP for OSA  OSA: -on cpap, seeing pulm  Irr menstrual bleeding: -fo 1 year, sometimes spots between periods -sees Dr. Lottie Dawson  ROS: See pertinent positives and negatives per HPI.  Past Medical History  Diagnosis Date  . Fibromyalgia   . Depression   . Insomnia   . Hypertension   . MRSA (methicillin resistant Staphylococcus aureus) infection   . Chronic headaches     Past Surgical History  Procedure Laterality Date  . Appendectomy  2005  . Cesarean section    . Tubal ligation  1998    Family History  Problem Relation Age of Onset  . Alcohol abuse Mother   . Hypertension Mother   . Drug abuse Father   . Hypertension Father   . Depression Paternal Aunt   . Depression Paternal Uncle   . Alcohol abuse Maternal Grandmother   . Arthritis Maternal Grandmother   . Cancer Maternal Grandmother   . Hypertension Maternal Grandmother   . Alcohol abuse Maternal Grandfather   . Hypertension Maternal Grandfather   . Arthritis Paternal Grandmother   . Cancer Paternal Grandmother   . Hypertension Paternal Grandmother   . Hypertension Paternal Grandfather   . Asthma Paternal Grandmother   . Asthma Brother   . Asthma Daughter   . Asthma Daughter   . Emphysema Father     Social History   Social History  . Marital Status: Legally Separated    Spouse Name: N/A  . Number of Children: N/A  . Years of Education: N/A   Occupational  History  . RN    Social History Main Topics  . Smoking status: Never Smoker   . Smokeless tobacco: None  . Alcohol Use: No  . Drug Use: No  . Sexual Activity: Not Asked   Other Topics Concern  . 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  hydrochlorothiazide (HYDRODIURIL) 25 MG tablet, Take 1 tablet (25 mg total) by mouth daily., Disp: 90 tablet, Rfl: 3 .  ibuprofen (ADVIL,MOTRIN) 600 MG tablet, Take 1 tablet (600 mg total) by mouth every 6 (six) hours as needed for pain., Disp: 30 tablet, Rfl: 0 .  meloxicam (MOBIC) 15 MG tablet, Take 1 tablet (15 mg total) by mouth daily., Disp: 20 tablet, Rfl: 0  EXAM:  Filed Vitals:   05/04/15 0958  BP: 118/78  Pulse: 97  Temp: 98.5 F (36.9 C)    Body mass index is 35.01 kg/(m^2).  GENERAL: vitals reviewed and listed above, alert, oriented, appears well hydrated and in no acute distress  HEENT: atraumatic, conjunttiva clear, no obvious abnormalities  on inspection of external nose and ears  NECK: no obvious masses on inspection  LUNGS: clear to auscultation bilaterally, no wheezes, rales or rhonchi, good air movement  CV: HRRR, no peripheral edema  MS: moves all extremities without noticeable abnormality  PSYCH: pleasant and cooperative, no obvious depression or anxiety  ASSESSMENT AND PLAN:  Discussed the following assessment and plan:  Essential hypertension  Obesity  Fibromyalgia  OSA (obstructive sleep apnea)  -discussed BP medications/risks/benefits at length, she opted to stick with the hctz -advised eval with her gynecologist regarding her irr bleeding and discussed risks and advised since > 35 would likely need US and/or EMB - she agreed to call her gyn for  appt -congrats on lifestyle changes and weight reduction -discussed risks with nsaids, she is using sparingly and reports her fibro has improved on cpap -requested copy of labs  -follow up 6 months  -Patient advised to return or notify a doctor immediately if symptoms worsen or persist or new concerns arise.  Patient Instructions  BEFORE YOU LEAVE: -schedule follow up in 6 months  Please call to set up appointment with Dr. Cherly Hensenousins regarding your irregular period  We recommend the following healthy lifestyle measures: - eat a healthy whole foods diet consisting of regular small meals composed of vegetables, fruits, beans, nuts, seeds, healthy meats such as white chicken and fish and whole grains.  - avoid sweets, white starchy foods, fried foods, fast food, processed foods, sodas, red meet and other fattening foods.  - get a least 150-300 minutes of aerobic exercise per week.   Please send us a copy of your recent labs  Congratulations on the weight loss!     Kriste BasqueKIM, Dioselina Brumbaugh R.

## 2015-05-04 NOTE — Patient Instructions (Addendum)
BEFORE YOU LEAVE: -schedule follow up in 6 months  Please call to set up appointment with Dr. Cherly Hensenousins regarding your irregular period  We recommend the following healthy lifestyle measures: - eat a healthy whole foods diet consisting of regular small meals composed of vegetables, fruits, beans, nuts, seeds, healthy meats such as white chicken and fish and whole grains.  - avoid sweets, white starchy foods, fried foods, fast food, processed foods, sodas, red meet and other fattening foods.  - get a least 150-300 minutes of aerobic exercise per week.   Please send us a copy of your recent labs  Congratulations on the weight loss!

## 2015-05-04 NOTE — Addendum Note (Signed)
Addended by: Johnella MoloneyFUNDERBURK, JO A on: 05/04/2015 10:32 AM   Modules accepted: Orders

## 2015-06-01 ENCOUNTER — Emergency Department (HOSPITAL_COMMUNITY)
Admission: EM | Admit: 2015-06-01 | Discharge: 2015-06-02 | Disposition: A | Discharge status: Home / Self Care | Payer: Managed Care, Other (non HMO) | Attending: Emergency Medicine | Admitting: Emergency Medicine

## 2015-06-01 ENCOUNTER — Emergency Department (HOSPITAL_COMMUNITY): Payer: Managed Care, Other (non HMO)

## 2015-06-01 ENCOUNTER — Encounter (HOSPITAL_COMMUNITY): Payer: Self-pay | Admitting: Emergency Medicine

## 2015-06-01 DIAGNOSIS — G8929 Other chronic pain: Secondary | ICD-10-CM | POA: Diagnosis not present

## 2015-06-01 DIAGNOSIS — Z8659 Personal history of other mental and behavioral disorders: Secondary | ICD-10-CM | POA: Insufficient documentation

## 2015-06-01 DIAGNOSIS — L089 Local infection of the skin and subcutaneous tissue, unspecified: Secondary | ICD-10-CM

## 2015-06-01 DIAGNOSIS — Z79899 Other long term (current) drug therapy: Secondary | ICD-10-CM | POA: Diagnosis not present

## 2015-06-01 DIAGNOSIS — I10 Essential (primary) hypertension: Secondary | ICD-10-CM | POA: Diagnosis not present

## 2015-06-01 DIAGNOSIS — Z791 Long term (current) use of non-steroidal anti-inflammatories (NSAID): Secondary | ICD-10-CM | POA: Insufficient documentation

## 2015-06-01 DIAGNOSIS — Z8614 Personal history of Methicillin resistant Staphylococcus aureus infection: Secondary | ICD-10-CM | POA: Insufficient documentation

## 2015-06-01 DIAGNOSIS — M545 Low back pain: Secondary | ICD-10-CM | POA: Diagnosis present

## 2015-06-01 MED ORDER — HYDROCODONE-ACETAMINOPHEN 5-325 MG PO TABS
1.0000 | ORAL_TABLET | Freq: Once | ORAL | Status: AC
  Administered 2015-06-01: 1 via ORAL
  Filled 2015-06-01: qty 1

## 2015-06-01 NOTE — ED Notes (Signed)
Pt reports tailbone pain starting today. Denies previous injury/fall/trauma/surgery. Denies redness or warmth in area. No other c/c. Unable to sit and place pressure on area.

## 2015-06-01 NOTE — ED Provider Notes (Signed)
CSN: [id number]     Arrival date & time 06/01/15  2004 History   First MD Initiated Contact with Patient 06/01/15 2134     Chief Complaint  Patient presents with  . Tailbone Pain     (Consider location/radiation/quality/duration/timing/severity/associated sxs/prior Treatment) The history is provided by the patient and medical records. No language interpreter was used.     Shaeley Segall is a 39 y.o. female  with a hx of rheumatic nausea, depression, insomnia, hypertension, MRSA and recurrent abscesses presents to the Emergency Department complaining of gradual, persistent, progressively worsening clear cleft in tailbone pain beginning around 12:30 PM today. She denies fall, known trauma, recent injury. She denies redness, warmth or drainage from the area. She reports she is unable to sit due to pain. Patient denies constipation and reports her last bowel movement was yesterday. Patient reports she has the urge to have a bowel movement however the pain in her gluteal cleft has prevented her from doing so. Patient denies rectal pain, abdominal pain, fever or chills. She does report associated nausea. Treatment prior to arrival. Nothing makes it better or worse. Patient denies history of pilonidal abscess.  Patient denies back pain, leg pain, loss of bowel or bladder control, IV drug use.   Past Medical History  Diagnosis Date  . Fibromyalgia   . Depression   . Insomnia   . Hypertension   . MRSA (methicillin resistant Staphylococcus aureus) infection   . Chronic headaches    Past Surgical History  Procedure Laterality Date  . Appendectomy  2005  . Cesarean section    . Tubal ligation  1998   Family History  Problem Relation Age of Onset  . Alcohol abuse Mother   . Hypertension Mother   . Drug abuse Father   . Hypertension Father   . Depression Paternal Aunt   . Depression Paternal Uncle   . Alcohol abuse Maternal Grandmother   . Arthritis Maternal Grandmother   . Cancer  Maternal Grandmother   . Hypertension Maternal Grandmother   . Alcohol abuse Maternal Grandfather   . Hypertension Maternal Grandfather   . Arthritis Paternal Grandmother   . Cancer Paternal Grandmother   . Hypertension Paternal Grandmother   . Hypertension Paternal Grandfather   . Asthma Paternal Grandmother   . Asthma Brother   . Asthma Daughter   . Asthma Daughter   . Emphysema Father    Social History  Substance Use Topics  . Smoking status: Never Smoker   . Smokeless tobacco: None  . Alcohol Use: No   OB History    No data available     Review of Systems  Constitutional: Negative for fever, diaphoresis, appetite change, fatigue and unexpected weight change.  HENT: Negative for mouth sores.   Eyes: Negative for visual disturbance.  Respiratory: Negative for cough, chest tightness, shortness of breath and wheezing.   Cardiovascular: Negative for chest pain.  Gastrointestinal: Negative for nausea, vomiting, abdominal pain, diarrhea and constipation.  Endocrine: Negative for polydipsia, polyphagia and polyuria.  Genitourinary: Negative for dysuria, urgency, frequency and hematuria.  Musculoskeletal: Negative for back pain and neck stiffness.       Tailbone pain  Skin: Negative for rash.  Allergic/Immunologic: Negative for immunocompromised state.  Neurological: Negative for syncope, light-headedness and headaches.  Hematological: Does not bruise/bleed easily.  Psychiatric/Behavioral: Negative for sleep disturbance. The patient is not nervous/anxious.       Allergies  Dexamethasone; Iohexol; Ivp dye; and Sulfa antibiotics  Home Medications   Prior  to Admission medications   Medication Sig Start Date End Date Taking? Authorizing Provider  hydrochlorothiazide (HYDRODIURIL) 25 MG tablet Take 1 tablet (25 mg total) by mouth daily. 12/01/14  Yes Terressa KoyanagiHannah R Kim, DO  meloxicam (MOBIC) 15 MG tablet Take 1 tablet (15 mg total) by mouth daily. 05/04/15  Yes Terressa KoyanagiHannah R Kim, DO   doxycycline (VIBRAMYCIN) 100 MG capsule Take 1 capsule (100 mg total) by mouth 2 (two) times daily. 06/02/15   Jamyria Ozanich, PA-C  HYDROcodone-acetaminophen (NORCO/VICODIN) 5-325 MG tablet Take 1-2 tablets by mouth every 6 (six) hours as needed for moderate pain or severe pain. 06/02/15   Laketta Soderberg, PA-C   BP 121/65 mmHg  Pulse 96  Temp(Src) 98.8 F (37.1 C) (Oral)  Resp 18  SpO2 100%  LMP 04/27/2015 Physical Exam  Constitutional: She appears well-developed and well-nourished. No distress.  Awake, alert, nontoxic appearance  HENT:  Head: Normocephalic and atraumatic.  Mouth/Throat: Oropharynx is clear and moist. No oropharyngeal exudate.  Eyes: Conjunctivae are normal. No scleral icterus.  Neck: Normal range of motion. Neck supple.  Cardiovascular: Normal rate, regular rhythm and intact distal pulses.   Pulmonary/Chest: Effort normal and breath sounds normal. No respiratory distress. She has no wheezes.  Equal chest expansion  Abdominal: Soft. Bowel sounds are normal. She exhibits no mass. There is no tenderness. There is no rebound and no guarding.  Genitourinary: Rectal exam shows no external hemorrhoid, no internal hemorrhoid, no fissure, no mass, no tenderness and anal tone normal.  No pain on DRE or fluctuance Exquisite tenderness to palpation in the central region of the gluteal cleft, midline without erythema, increased warmth or induration No perirectal tenderness No tenderness at the top of the gluteal cleft  Musculoskeletal: Normal range of motion. She exhibits no edema.  Neurological: She is alert.  Speech is clear and goal oriented Moves extremities without ataxia  Skin: Skin is warm and dry. She is not diaphoretic.  Psychiatric: She has a normal mood and affect.  Nursing note and vitals reviewed.   ED Course  Procedures (including critical care time) Labs Review Labs Reviewed  CBC WITH DIFFERENTIAL/PLATELET - Abnormal; Notable for the following:     Hemoglobin 11.3 (*)    HCT 34.7 (*)    All other components within normal limits  BASIC METABOLIC PANEL - Abnormal; Notable for the following:    Calcium 8.8 (*)    All other components within normal limits    Imaging Review Ct Pelvis Wo Contrast  06/02/2015  CLINICAL DATA:  39 year old female with pain in the sacrum and coccyx region. Denies injury. EXAM: CT PELVIS WITHOUT CONTRAST TECHNIQUE: Multidetector CT imaging of the pelvis was performed following the standard protocol without intravenous contrast. COMPARISON:  CT dated 08/07/2003 FINDINGS: Evaluation of this exam is limited in the absence of intravenous contrast. No free air or free fluid identified within the pelvis. There is mild stranding of the mesentery within the pelvis which may represent an inflammatory process involving the bowel. Evaluation of the bowel is limited in the absence of oral contrast. Clinical correlation is recommended. The urinary bladder appears unremarkable. The uterus is anteverted and grossly unremarkable. The visualized left ovary appears grossly unremarkable. Appendectomy. The visualized iliac vasculature are grossly unremarkable on this noncontrast study. There is no lymphadenopathy. Small fat containing umbilical hernia. The osseous structures appear unremarkable. There is mild stranding of the subcutaneous soft tissues of the lower back at L4-5. No drainable fluid collection/ abscess identified. IMPRESSION: Mild subcutaneous soft  tissue stranding of the lower back. No drainable fluid collection/ abscess identified. No acute fracture or osseous pathology. Mild diffuse mesenteric stranding. Clinical correlation is recommended to evaluate for enteritis. Electronically Signed   By: Elgie Collard M.D.   On: 06/02/2015 00:09   I have personally reviewed and evaluated these images and lab results as part of my medical decision-making.   EKG Interpretation None       EMERGENCY DEPARTMENT US SOFT TISSUE  INTERPRETATION "Study: Limited Ultrasound of the noted body part in comments below"  INDICATIONS: Pain Multiple views of the body part are obtained with a multi-frequency linear probe  PERFORMED BY:  Myself  IMAGES ARCHIVED?: Yes  SIDE:Midline  BODY PART: Gluteal cleft  FINDINGS: No abcess noted  LIMITATIONS:  Body Habitus  INTERPRETATION:  No abcess noted  COMMENT:  No significant cobblestoning or fluid collection noted    MDM   Final diagnoses:  Infection of subcutaneous tissue   Delray Alt presents with tenderness over her tailbone and in her gluteal cleft.  No skin changes, erythema or induration.  Ultrasound without evidence of abscess however patient is very tender.  She is afebrile without tachycardia or hypotension.  No leukocytosis. Mild anemia noted. CT scan with stranding of the subcutaneous tissue over L4-L5.  Patient with long history of MRSA infections. She is allergic to Bactrim. Will give doxycycline. Recommend close follow-up within 2 days for further evaluation with her primary care physician or here in the emergency department. She is to return to the emergency department sooner if she develops abdominal pain, nausea, vomiting, fevers or chills.  Abdomen is soft and nontender. She has no midline back pain, full range of motion of her legs and denies loss of bowel or bladder control. She denies risk factors such as IV drug use. Doubt discitis at this time.  BP 121/65 mmHg  Pulse 96  Temp(Src) 98.8 F (37.1 C) (Oral)  Resp 18  SpO2 100%  LMP 04/27/2015   Dahlia Client Vonzella Althaus, PA-C 06/02/15 0136  Rolland Porter, MD 06/14/15 [phone]

## 2015-06-01 NOTE — ED Notes (Signed)
Attempted IV x2 without success  

## 2015-06-02 LAB — CBC WITH DIFFERENTIAL/PLATELET
Basophils Absolute: 0 10*3/uL (ref 0.0–0.1)
Basophils Relative: 0 %
Eosinophils Absolute: 0.1 10*3/uL (ref 0.0–0.7)
Eosinophils Relative: 1 %
HCT: 34.7 % — ABNORMAL LOW (ref 36.0–46.0)
Hemoglobin: 11.3 g/dL — ABNORMAL LOW (ref 12.0–15.0)
Lymphocytes Relative: 28 %
Lymphs Abs: 2.6 10*3/uL (ref 0.7–4.0)
MCH: 28 pg (ref 26.0–34.0)
MCHC: 32.6 g/dL (ref 30.0–36.0)
MCV: 86.1 fL (ref 78.0–100.0)
Monocytes Absolute: 0.7 10*3/uL (ref 0.1–1.0)
Monocytes Relative: 7 %
Neutro Abs: 5.9 10*3/uL (ref 1.7–7.7)
Neutrophils Relative %: 64 %
PLATELETS: 360 10*3/uL (ref 150–400)
RBC: 4.03 MIL/uL (ref 3.87–5.11)
RDW: 13.1 % (ref 11.5–15.5)
WBC: 9.3 10*3/uL (ref 4.0–10.5)

## 2015-06-02 LAB — BASIC METABOLIC PANEL
Anion gap: 8 (ref 5–15)
BUN: 12 mg/dL (ref 6–20)
CALCIUM: 8.8 mg/dL — AB (ref 8.9–10.3)
CO2: 23 mmol/L (ref 22–32)
Chloride: 104 mmol/L (ref 101–111)
Creatinine, Ser: 0.76 mg/dL (ref 0.44–1.00)
GFR calc Af Amer: >60 mL/min (ref >60)
GFR calc non Af Amer: >60 mL/min (ref >60)
GLUCOSE: 99 mg/dL (ref 65–99)
POTASSIUM: 3.5 mmol/L (ref 3.5–5.1)
Sodium: 135 mmol/L (ref 135–145)

## 2015-06-02 MED ORDER — OXYCODONE HCL 5 MG PO TABS
10.0000 mg | ORAL_TABLET | Freq: Once | ORAL | Status: AC
  Administered 2015-06-02: 10 mg via ORAL
  Filled 2015-06-02: qty 2

## 2015-06-02 MED ORDER — HYDROCODONE-ACETAMINOPHEN 5-325 MG PO TABS: 1.0000 | ORAL_TABLET | Freq: Four times a day (QID) | ORAL | Status: DC | PRN

## 2015-06-02 MED ORDER — DOXYCYCLINE HYCLATE 100 MG PO CAPS: 100.0000 mg | ORAL_CAPSULE | Freq: Two times a day (BID) | ORAL | Status: DC

## 2015-06-02 MED ORDER — DOXYCYCLINE HYCLATE 100 MG PO TABS
100.0000 mg | ORAL_TABLET | Freq: Once | ORAL | Status: AC
  Administered 2015-06-02: 100 mg via ORAL
  Filled 2015-06-02: qty 1

## 2015-06-02 NOTE — Discharge Instructions (Signed)
1. Medications: doxycycline, vicodin, usual home medications 2. Treatment: rest, drink plenty of fluids,  3. Follow Up: Please followup with your primary doctor in 2 days for discussion of your diagnoses and further evaluation after today's visit; if you do not have a primary care doctor use the resource guide provided to find one; Please return to the ER for worsening pain, skin changes or other concerns

## 2015-06-15 ENCOUNTER — Ambulatory Visit (INDEPENDENT_AMBULATORY_CARE_PROVIDER_SITE_OTHER): Payer: Managed Care, Other (non HMO) | Admitting: Family Medicine

## 2015-06-15 DIAGNOSIS — R69 Illness, unspecified: Secondary | ICD-10-CM

## 2015-06-15 NOTE — Progress Notes (Signed)
NO SHOW

## 2015-06-16 ENCOUNTER — Ambulatory Visit (INDEPENDENT_AMBULATORY_CARE_PROVIDER_SITE_OTHER): Payer: Managed Care, Other (non HMO) | Admitting: Family Medicine

## 2015-06-16 ENCOUNTER — Ambulatory Visit (INDEPENDENT_AMBULATORY_CARE_PROVIDER_SITE_OTHER)
Admission: RE | Admit: 2015-06-16 | Discharge: 2015-06-16 | Disposition: A | Discharge status: Home / Self Care | Payer: Managed Care, Other (non HMO) | Source: Ambulatory Visit | Attending: Family Medicine | Admitting: Family Medicine

## 2015-06-16 ENCOUNTER — Encounter: Payer: Self-pay | Admitting: Family Medicine

## 2015-06-16 VITALS — BP 136/84 | HR 85 | Temp 98.5°F | Ht 61.0 in | Wt 192.3 lb

## 2015-06-16 DIAGNOSIS — F418 Other specified anxiety disorders: Secondary | ICD-10-CM

## 2015-06-16 DIAGNOSIS — R0789 Other chest pain: Secondary | ICD-10-CM | POA: Diagnosis not present

## 2015-06-16 DIAGNOSIS — M797 Fibromyalgia: Secondary | ICD-10-CM | POA: Diagnosis not present

## 2015-06-16 DIAGNOSIS — F329 Major depressive disorder, single episode, unspecified: Secondary | ICD-10-CM

## 2015-06-16 DIAGNOSIS — F419 Anxiety disorder, unspecified: Secondary | ICD-10-CM

## 2015-06-16 DIAGNOSIS — R5382 Chronic fatigue, unspecified: Secondary | ICD-10-CM | POA: Diagnosis not present

## 2015-06-16 DIAGNOSIS — F32A Depression, unspecified: Secondary | ICD-10-CM

## 2015-06-16 NOTE — Progress Notes (Signed)
HPI:  Acute visit for:  Rib pain: - started yesterday, pain in bilat lateral chest wall R>L - thought it was her fibromyalgia, but after recent visit to ED 06/01/15 for low back pain and told had subcutaneous infection she is concerned could be this -she is also worried the pain could be a PE or pneumonia because she has a sedentary job - no SOB, leg swelling or cramps, birth control use, recent travel or smoking -denies: fever, malaise, cough, chills, SOB, wheezing -low back issue completely resolved with doxy -she also is depressed about her chronic fatigue and muscle pain associated with her fibromyalgia -has struggled with GAD and Depression"her whole life" -has been on many medications for the fibro and depression and did not tolerate them and due to substance abuse in family as afraid of ending up on any drugs -she feels she is too Young to feel "crappy" all the time -she saw Dr. Jason FilaBray (psycholgist) here one and didn't follow up - reports she is "very competent" but doubts if will help -she is interested in CAM  ROS: See pertinent positives and negatives per HPI.  Past Medical History  Diagnosis Date  . Fibromyalgia   . Depression   . Insomnia   . Hypertension   . MRSA (methicillin resistant Staphylococcus aureus) infection   . Chronic headaches     Past Surgical History  Procedure Laterality Date  . Appendectomy  2005  . Cesarean section    . Tubal ligation  1998    Family History  Problem Relation Age of Onset  . Alcohol abuse Mother   . Hypertension Mother   . Drug abuse Father   . Hypertension Father   . Depression Paternal Aunt   . Depression Paternal Uncle   . Alcohol abuse Maternal Grandmother   . Arthritis Maternal Grandmother   . Cancer Maternal Grandmother   . Hypertension Maternal Grandmother   . Alcohol abuse Maternal Grandfather   . Hypertension Maternal Grandfather   . Arthritis Paternal Grandmother   . Cancer Paternal Grandmother   .  Hypertension Paternal Grandmother   . Hypertension Paternal Grandfather   . Asthma Paternal Grandmother   . Asthma Brother   . Asthma Daughter   . Asthma Daughter   . Emphysema Father     Social History   Social History  . Marital Status: Legally Separated    Spouse Name: N/A  . Number of Children: N/A  . Years of Education: N/A   Occupational History  . RN    Social History Main Topics  . Smoking status: Never Smoker   . Smokeless tobacco: None  . Alcohol Use: No  . Drug Use: No  . Sexual Activity: Not Asked   Other Topics Concern  . 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  hydrochlorothiazide (HYDRODIURIL) 25 MG tablet, Take 1 tablet (25 mg total) by mouth daily., Disp: 90 tablet, Rfl: 3 .  HYDROcodone-acetaminophen (NORCO/VICODIN) 5-325 MG tablet, Take 1-2 tablets by mouth every 6 (six) hours as needed for moderate pain or severe pain., Disp: 15 tablet, Rfl: 0 .  meloxicam (MOBIC) 15 MG tablet, Take 1 tablet (15  mg total) by mouth daily., Disp: 20 tablet, Rfl: 0  EXAM:  Filed Vitals:   06/16/15 1541  BP: 136/84  Pulse: 85  Temp: 98.5 F (36.9 C)    Body mass index is 36.35 kg/(m^2).  GENERAL: vitals reviewed and listed above, alert, oriented, appears well hydrated and in no acute distress  HEENT: atraumatic, conjunttiva clear, no obvious abnormalities on inspection of external nose and ears  NECK: no obvious masses on inspection  LUNGS: clear to auscultation bilaterally, no wheezes, rales or rhonchi, good air movement  CV: HRRR, no peripheral edema  MS: moves all extremities without noticeable abnormality, TTP bilat soft tissues overlying lower lateral rib cage at bra line L >R, no sig bony TTP, no TTP low  back  SKIN: no redness, warmth or signs of infection at low back or chest wall  PSYCH: depressed mood, tearful at times  ASSESSMENT AND PLAN:  Discussed the following assessment and plan:  Chest wall pain - Plan: D-dimer, Quantitative, DG Chest 2 View -soft tissue per exam and less likely an intrathoracic etiology - suspect fibro pain with bra compression. Will get CXR and d-dimer given her concerns and further eval/tx accordingly.  Fibromyalgia -she has tried lyrica, cymbalta and many other medications, does not want to be on medications, seems skepitcal of CBT but agrees to reconsider, also may consider accupunture and/or [facility] integrative therapy for her interest in CAM; offered referral to rheum but she feels she has had extensive eval and would end up on medications which she does not want.  Anxiety and depression -again, she is not interested in medications and is skeptical of CBT, advised both and happy to help with these if she reconsiders, and healthy lifestyle, no SI or thoughts of self harm - feels is her norm  Chronic fatigue - Plan: CBC with Differential, TSH, Vitamin B12, Folate, Ferritin, Hemoglobin A1c, TTG - offered rheum/integrative referral - she plans to consider  -Patient advised to return or notify a doctor immediately if symptoms worsen or persist or new concerns arise.  Patient Instructions  BEFORE YOU LEAVE: -xray sheet -labs -follow up in 1-2 months  Get chest xray  Please follow up with Dr. Jason Fila  Please let me know if you would like to see a specialist regarding the fibromyalgia       KIM, HANNAH R.

## 2015-06-16 NOTE — Progress Notes (Signed)
Pre visit review using our clinic review tool, if applicable. No additional management support is needed unless otherwise documented below in the visit note. 

## 2015-06-16 NOTE — Patient Instructions (Signed)
BEFORE YOU LEAVE: -xray sheet -labs -follow up in 1-2 months  Get chest xray  Please follow up with Dr. Jason FilaBray  Please let me know if you would like to see a specialist regarding the fibromyalgia

## 2015-06-17 ENCOUNTER — Encounter: Payer: Self-pay | Admitting: Family Medicine

## 2015-06-17 LAB — CBC WITH DIFFERENTIAL/PLATELET
BASOS ABS: 0 10*3/uL (ref 0.0–0.1)
Basophils Relative: 0 % (ref 0–1)
EOS ABS: 0.1 10*3/uL (ref 0.0–0.7)
EOS PCT: 1 % (ref 0–5)
HEMATOCRIT: 40.4 % (ref 36.0–46.0)
Hemoglobin: 13.1 g/dL (ref 12.0–15.0)
LYMPHS ABS: 2.3 10*3/uL (ref 0.7–4.0)
Lymphocytes Relative: 28 % (ref 12–46)
MCH: 28.4 pg (ref 26.0–34.0)
MCHC: 32.4 g/dL (ref 30.0–36.0)
MCV: 87.6 fL (ref 78.0–100.0)
MONO ABS: 0.5 10*3/uL (ref 0.1–1.0)
MONOS PCT: 6 % (ref 3–12)
MPV: 9 fL (ref 8.6–12.4)
Neutro Abs: 5.3 10*3/uL (ref 1.7–7.7)
Neutrophils Relative %: 65 % (ref 43–77)
PLATELETS: 412 10*3/uL — AB (ref 150–400)
RBC: 4.61 MIL/uL (ref 3.87–5.11)
RDW: 13.7 % (ref 11.5–15.5)
WBC: 8.2 10*3/uL (ref 4.0–10.5)

## 2015-06-17 LAB — FERRITIN: Ferritin: 46 ng/mL (ref 10–291)

## 2015-06-17 LAB — D-DIMER, QUANTITATIVE: D-Dimer, Quant: 0.29 ug/mL-FEU (ref 0.00–0.48)

## 2015-06-17 LAB — FOLATE: Folate: 8.2 ng/mL

## 2015-06-17 LAB — HEMOGLOBIN A1C
Hgb A1c MFr Bld: 5.7 % — ABNORMAL HIGH (ref <5.7)
MEAN PLASMA GLUCOSE: 117 mg/dL — AB (ref <117)

## 2015-06-17 LAB — VITAMIN B12: Vitamin B-12: 1048 pg/mL — ABNORMAL HIGH (ref 211–911)

## 2015-06-17 LAB — TSH: TSH: 1.833 u[IU]/mL (ref 0.350–4.500)

## 2015-06-19 ENCOUNTER — Ambulatory Visit: Payer: Self-pay | Admitting: Family Medicine

## 2015-06-19 LAB — CELIAC PANEL 10
ENDOMYSIAL SCREEN: NEGATIVE
GLIADIN IGA: 4 U (ref <20)
Gliadin IgG: 1 Units (ref <20)
IGA: 194 mg/dL (ref 69–380)
TISSUE TRANSGLUT AB: 1 U/mL (ref <6)
TISSUE TRANSGLUTAMINASE AB, IGA: 1 U/mL (ref <4)

## 2015-06-19 NOTE — Telephone Encounter (Signed)
Spoke to patient. Patient states her pain is intermittent in flank area. Pain was constant until Saturday when pain would slowly decrease and then start again. Pain denied knowledge of what triggers the pain. Patient currently on Vicodin and states does provide relief but does not completely relieve the pain. Pain is in her "whole left side and rib cage." Pain is described as dull, achy, and seems more superficial. Per patient's description, pain seems more musculoskeletal. Patient denied referral for Rheumatologist but says she is interested in Integrative Medicine referral. If approved by Dr. Selena BattenKim, patient would like to make sure we use an in-network provider who works with [employer] for referral. Will route message to Dr. Selena BattenKim for interventions.

## 2015-06-19 NOTE — Telephone Encounter (Signed)
Attempted to contact patient to follow-up with symptoms. Left message on cell phone to call office back. Home phone's voicemail is not accepting message.

## 2015-08-01 ENCOUNTER — Encounter: Payer: Self-pay | Admitting: Family Medicine

## 2015-08-01 ENCOUNTER — Encounter: Payer: Self-pay | Admitting: *Deleted

## 2015-08-01 ENCOUNTER — Ambulatory Visit (INDEPENDENT_AMBULATORY_CARE_PROVIDER_SITE_OTHER): Payer: Managed Care, Other (non HMO) | Admitting: Family Medicine

## 2015-08-01 VITALS — BP 138/80 | HR 86 | Temp 98.7°F | Ht 61.0 in | Wt 188.5 lb

## 2015-08-01 DIAGNOSIS — M2550 Pain in unspecified joint: Secondary | ICD-10-CM | POA: Diagnosis not present

## 2015-08-01 DIAGNOSIS — M79645 Pain in left finger(s): Secondary | ICD-10-CM

## 2015-08-01 MED ORDER — NAPROXEN 500 MG PO TABS: 500.0000 mg | ORAL_TABLET | Freq: Two times a day (BID) | ORAL | Status: DC

## 2015-08-01 NOTE — Patient Instructions (Signed)
Work note: seen today, return 08/03/15  Take the naproxen twice daily for 1 week   We placed a referral for you as discussed to the rhuematologist. It usually takes about 1-2 weeks to process and schedule this referral. If you have not heard from [place] regarding this appointment in 2 weeks please contact our office.  If worsening, spreading, feeling sick seek care immediately

## 2015-08-01 NOTE — Progress Notes (Signed)
Pre visit review using our clinic review tool, if applicable. No additional management support is needed unless otherwise documented below in the visit note. 

## 2015-08-01 NOTE — Progress Notes (Signed)
HPI:  Acute visit for:  Finger pain: -L 2nd PIP -started a few days ago acutely -symptoms: pain, swelling, redness initially -denies: fevers, malaise, rash, spreading -hx similar episode in toe in the past and resolved with meloxicam  -hx multiple muscle and jt pains and reports FH multiple family members with gout, psoriatic arthritis, lupus and autoimmune disease - she is interested in rheumatological evaluation  ROS: See pertinent positives and negatives per HPI.  Past Medical History  Diagnosis Date  . Fibromyalgia   . Depression   . Insomnia   . Hypertension   . MRSA (methicillin resistant Staphylococcus aureus) infection   . Chronic headaches     Past Surgical History  Procedure Laterality Date  . Appendectomy  2005  . Cesarean section    . Tubal ligation  1998    Family History  Problem Relation Age of Onset  . Alcohol abuse Mother   . Hypertension Mother   . Drug abuse Father   . Hypertension Father   . Depression Paternal Aunt   . Depression Paternal Uncle   . Alcohol abuse Maternal Grandmother   . Arthritis Maternal Grandmother   . Cancer Maternal Grandmother   . Hypertension Maternal Grandmother   . Alcohol abuse Maternal Grandfather   . Hypertension Maternal Grandfather   . Arthritis Paternal Grandmother   . Cancer Paternal Grandmother   . Hypertension Paternal Grandmother   . Hypertension Paternal Grandfather   . Asthma Paternal Grandmother   . Asthma Brother   . Asthma Daughter   . Asthma Daughter   . Emphysema Father     Social History   Social History  . Marital Status: Legally Separated    Spouse Name: N/A  . Number of Children: N/A  . Years of Education: N/A   Occupational History  . RN    Social History Main Topics  . Smoking status: Never Smoker   . Smokeless tobacco: None  . Alcohol Use: No  . Drug Use: No  . Sexual Activity: Not Asked   Other Topics Concern  . 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  hydrochlorothiazide (HYDRODIURIL) 25 MG tablet, Take 1 tablet (25 mg total) by mouth daily., Disp: 90 tablet, Rfl: 3 .  HYDROcodone-acetaminophen (NORCO/VICODIN) 5-325 MG tablet, Take 1-2 tablets by mouth every 6 (six) hours as needed for moderate pain or severe pain., Disp: 15 tablet, Rfl: 0 .  meloxicam (MOBIC) 15 MG tablet, Take 1 tablet (15 mg total) by mouth daily., Disp: 20 tablet, Rfl: 0 .  tinidazole (TINDAMAX) 500 MG tablet, , Disp: , Rfl:  .  naproxen (NAPROSYN) 500 MG tablet, Take 1 tablet (500 mg total) by mouth 2 (two) times daily with a meal., Disp: 30 tablet, Rfl: 0  EXAM:  Filed Vitals:   08/01/15 1108  BP: 138/80  Pulse: 86  Temp: 98.7 F (37.1 C)    Body mass index is 35.64 kg/(m^2).  GENERAL: vitals reviewed and listed above, alert, oriented, appears well hydrated and in no acute distress  HEENT: atraumatic, conjunttiva clear, no obvious abnormalities on inspection of external nose and ears  NECK: no obvious masses on  inspection  LUNGS: clear to auscultation bilaterally, no wheezes, rales or rhonchi, good air movement  CV: HRRR, no peripheral edema  MS: moves all extremities without noticeable abnormality  PSYCH: pleasant and cooperative, no obvious depression or anxiety  ASSESSMENT AND PLAN:  Discussed the following assessment and plan:  Pain of finger of left hand - Plan: naproxen (NAPROSYN) 500 MG tablet  Polyarthralgia - Plan: Ambulatory referral to Rheumatology  -we discussed possible serious and likely etiologies, workup and treatment, treatment risks and return precautions - query gout/pseudogout -doubt infection since redness improving, no trauma or skin breaks -after this discussion, Sandra Vang opted for tx with  NSAID, prompt re-eval with ortho urgent care eval if worsening or persists, rheum eval  -of course, we advised Sandra Vang  to return or notify a doctor immediately if symptoms worsen or persist or new concerns arise.  -Patient advised to return or notify a doctor immediately if symptoms worsen or persist or new concerns arise.  Patient Instructions  Work note: seen today, return 08/03/15  Take the naproxen twice daily for 1 week   We placed a referral for you as discussed to the rhuematologist. It usually takes about 1-2 weeks to process and schedule this referral. If you have not heard from [place] regarding this appointment in 2 weeks please contact our office.  If worsening, spreading, feeling sick seek care immediately       Sandra Vang, Sandra Client R.

## 2015-09-15 ENCOUNTER — Encounter: Payer: Self-pay | Admitting: Family Medicine

## 2015-09-15 DIAGNOSIS — M2550 Pain in unspecified joint: Secondary | ICD-10-CM | POA: Insufficient documentation

## 2015-10-26 ENCOUNTER — Encounter: Payer: Self-pay | Admitting: Family Medicine

## 2015-10-26 ENCOUNTER — Ambulatory Visit (INDEPENDENT_AMBULATORY_CARE_PROVIDER_SITE_OTHER): Payer: Self-pay | Admitting: Family Medicine

## 2015-10-26 VITALS — BP 110/88 | HR 85 | Temp 98.3°F | Ht 61.0 in | Wt 172.2 lb

## 2015-10-26 DIAGNOSIS — M2550 Pain in unspecified joint: Secondary | ICD-10-CM

## 2015-10-26 DIAGNOSIS — M797 Fibromyalgia: Secondary | ICD-10-CM

## 2015-10-26 DIAGNOSIS — H109 Unspecified conjunctivitis: Secondary | ICD-10-CM

## 2015-10-26 MED ORDER — ERYTHROMYCIN 5 MG/GM OP OINT: 1.0000 "application " | TOPICAL_OINTMENT | Freq: Every day | OPHTHALMIC | Status: DC

## 2015-10-26 NOTE — Progress Notes (Signed)
Pre visit review using our clinic review tool, if applicable. No additional management support is needed unless otherwise documented below in the visit note. 

## 2015-10-26 NOTE — Patient Instructions (Signed)
Compresses to the eye several times daily.  If not improving over the next few days to a start appointment and use once before bedtime for about 5 days.  If worsening or persist despite treatment, please schedule an appointment with your eye doctor.

## 2015-10-26 NOTE — Progress Notes (Signed)
HPI:  Acute visit for:  Conjunctivitis: -started 1.5 days ago -Irritation, clear discharge, mild crusting of the left eye - also some nasal congestion -Denies vision loss, fevers, headache, pain today -Normal eye exam 2 weeks ago  Polyarthralgia/Fibromyalgia: -Seeing rheumatologist -On prednisone daily -Asks about a refill on naproxen to use with prednisone -Seeing her rheumatologist next week per her report -No worsening of symptoms, fevers, malaise -She has changed her diet and is doing better and has lost weight  ROS: See pertinent positives and negatives per HPI.  Past Medical History  Diagnosis Date  . Fibromyalgia   . Depression   . Insomnia   . Hypertension   . MRSA (methicillin resistant Staphylococcus aureus) infection   . Chronic headaches     Past Surgical History  Procedure Laterality Date  . Appendectomy  2005  . Cesarean section    . Tubal ligation  1998    Family History  Problem Relation Age of Onset  . Alcohol abuse Mother   . Hypertension Mother   . Drug abuse Father   . Hypertension Father   . Depression Paternal Aunt   . Depression Paternal Uncle   . Alcohol abuse Maternal Grandmother   . Arthritis Maternal Grandmother   . Cancer Maternal Grandmother   . Hypertension Maternal Grandmother   . Alcohol abuse Maternal Grandfather   . Hypertension Maternal Grandfather   . Arthritis Paternal Grandmother   . Cancer Paternal Grandmother   . Hypertension Paternal Grandmother   . Hypertension Paternal Grandfather   . Asthma Paternal Grandmother   . Asthma Brother   . Asthma Daughter   . Asthma Daughter   . Emphysema Father     Social History   Social History  . Marital Status: Legally Separated    Spouse Name: N/A  . Number of Children: N/A  . Years of Education: N/A   Occupational History  . RN    Social History Main Topics  . Smoking status: Never Smoker   . Smokeless tobacco: None  . Alcohol Use: No  . Drug Use: No  . Sexual  Activity: Not Asked   Other Topics Concern  . 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  hydrochlorothiazide (HYDRODIURIL) 25 MG tablet, Take 1 tablet (25 mg total) by mouth daily., Disp: 90 tablet, Rfl: 3 .  Multiple Vitamin (MULTIVITAMIN) tablet, Take 1 tablet by mouth daily., Disp: , Rfl:  .  predniSONE (DELTASONE) 5 MG tablet, TK 1 T PO BID WF OR MILK, Disp: , Rfl: 3 .  QSYMIA 7.5-46 MG CP24, TK 1 C PO DAILY, Disp: , Rfl: 0 .  Vitamin D, Ergocalciferol, (DRISDOL) 50000 units CAPS capsule, TK ONE C PO ONCE A WEEK, Disp: , Rfl: 0 .  erythromycin ophthalmic ointment, Place 1 application into the left eye at bedtime., Disp: 3.5 g, Rfl: 0  EXAM:  Filed Vitals:   10/26/15 0808  BP: 110/88  Pulse: 85  Temp: 98.3 F (36.8 C)    Body mass index is 32.55 kg/(m^2).  GENERAL: vitals reviewed and listed above, alert, oriented, appears well hydrated and in no acute distress  HEENT: atraumatic, conjunttiva Mildly  erythematous on the left, no obvious foreign body, no obvious trauma to the eye,PERRLA, EOMI, visual acuity at baseline-she does not have her glasses on or her contacts in, no obvious abnormalities on inspection of external nose and ears  NECK: no obvious masses on inspection  MS: moves all extremities without noticeable abnormality  PSYCH: pleasant and cooperative, no obvious depression or anxiety  ASSESSMENT AND PLAN:  Discussed the following assessment and plan:  Conjunctivitis of left eye  Polyarthralgia  Fibromyalgia  -Suspect viral or allergic conjunctivitis, compresses. I ointment at night if not improving over the next few days. Did advise optho exam if worsening or not improving with treatment. -Advised against the  use of NSAIDs with prednisone, suggested Tylenol as needed and follow up with rheumatologist -Discussed benefits of anti-inflammatory diet and encouraged lifestyle changes and weight loss  -Patient advised to return or notify a doctor immediately if symptoms worsen or persist or new concerns arise.  Patient Instructions  Compresses to the eye several times daily.  If not improving over the next few days to a start appointment and use once before bedtime for about 5 days.  If worsening or persist despite treatment, please schedule an appointment with your eye doctor.     Kriste Basque R.

## 2015-12-02 ENCOUNTER — Other Ambulatory Visit: Payer: Self-pay | Admitting: Family Medicine

## 2016-08-12 ENCOUNTER — Encounter: Payer: Self-pay | Admitting: Family Medicine

## 2016-08-12 ENCOUNTER — Ambulatory Visit (INDEPENDENT_AMBULATORY_CARE_PROVIDER_SITE_OTHER): Payer: Managed Care, Other (non HMO) | Admitting: Family Medicine

## 2016-08-12 ENCOUNTER — Ambulatory Visit (INDEPENDENT_AMBULATORY_CARE_PROVIDER_SITE_OTHER)
Admission: RE | Admit: 2016-08-12 | Discharge: 2016-08-12 | Disposition: A | Discharge status: Home / Self Care | Payer: Managed Care, Other (non HMO) | Source: Ambulatory Visit | Attending: Family Medicine | Admitting: Family Medicine

## 2016-08-12 VITALS — BP 120/90 | HR 99 | Temp 98.9°F | Ht 61.0 in | Wt 174.4 lb

## 2016-08-12 DIAGNOSIS — R05 Cough: Secondary | ICD-10-CM

## 2016-08-12 DIAGNOSIS — J111 Influenza due to unidentified influenza virus with other respiratory manifestations: Secondary | ICD-10-CM

## 2016-08-12 DIAGNOSIS — I10 Essential (primary) hypertension: Secondary | ICD-10-CM | POA: Diagnosis not present

## 2016-08-12 DIAGNOSIS — R059 Cough, unspecified: Secondary | ICD-10-CM

## 2016-08-12 DIAGNOSIS — Z6832 Body mass index (BMI) 32.0-32.9, adult: Secondary | ICD-10-CM | POA: Diagnosis not present

## 2016-08-12 MED ORDER — PREDNISONE 20 MG PO TABS: 20.0000 mg | ORAL_TABLET | Freq: Every day | ORAL | 0 refills | Status: DC

## 2016-08-12 MED ORDER — BENZONATATE 100 MG PO CAPS: 100.0000 mg | ORAL_CAPSULE | Freq: Three times a day (TID) | ORAL | 0 refills | Status: DC | PRN

## 2016-08-12 NOTE — Progress Notes (Signed)
HPI:  Acute visit for cough: -started: 2 weeks ago with flu like symptoms (cough, congestion, fever, body aches) reports several family members with the flu -flu symptoms resolved but cough persisted and worsened the last few days with occ wheezy feeling -non-productive -Note: has not followed up per recommendations for chronic disease management, advised needs labs, she agrees to schedule with cpe in 4-6 weeks ROS: See pertinent positives and negatives per HPI.  Past Medical History:  Diagnosis Date  . Chronic headaches   . Depression   . Fibromyalgia   . Hypertension   . Insomnia   . MRSA (methicillin resistant Staphylococcus aureus) infection     Past Surgical History:  Procedure Laterality Date  . APPENDECTOMY  2005  . CESAREAN SECTION    . TUBAL LIGATION  1998    Family History  Problem Relation Age of Onset  . Alcohol abuse Mother   . Hypertension Mother   . Drug abuse Father   . Hypertension Father   . Depression Paternal Aunt   . Depression Paternal Uncle   . Alcohol abuse Maternal Grandmother   . Arthritis Maternal Grandmother   . Cancer Maternal Grandmother   . Hypertension Maternal Grandmother   . Alcohol abuse Maternal Grandfather   . Hypertension Maternal Grandfather   . Arthritis Paternal Grandmother   . Cancer Paternal Grandmother   . Hypertension Paternal Grandmother   . Hypertension Paternal Grandfather   . Asthma Paternal Grandmother   . Asthma Brother   . Asthma Daughter   . Asthma Daughter   . Emphysema Father     Social History   Social History  . Marital status: Legally Separated    Spouse name: N/A  . Number of children: N/A  . Years of education: N/A   Occupational History  . RN    Social History Main Topics  . Smoking status: Never Smoker  . Smokeless tobacco: Never Used  . Alcohol use No  . Drug use: No  . Sexual activity: Not Asked   Other Topics Concern  . 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  hydrochlorothiazide (HYDRODIURIL) 25 MG tablet, TAKE 1 TABLET(25 MG) BY MOUTH DAILY, Disp: 90 tablet, Rfl: 1 .  Multiple Vitamin (MULTIVITAMIN) tablet, Take 1 tablet by mouth daily., Disp: , Rfl:  .  Vitamin D, Ergocalciferol, (DRISDOL) 50000 units CAPS capsule, TK ONE C PO ONCE A WEEK, Disp: , Rfl: 0 .  benzonatate (TESSALON) 100 MG capsule, Take 1 capsule (100 mg total) by mouth 3 (three) times daily as needed for cough., Disp: 20 capsule, Rfl: 0 .  predniSONE (DELTASONE) 20 MG tablet, Take 1 tablet (20 mg total) by mouth daily with breakfast., Disp: 4 tablet, Rfl: 0  EXAM:  Vitals:   08/12/16 1527  BP: 120/90  Pulse: 99  Temp: 98.9 F (37.2 C)    Body mass index is 32.95 kg/m.  GENERAL: vitals reviewed and listed above, alert, oriented, appears well hydrated and in no acute distress  HEENT: atraumatic, conjunttiva clear, no obvious abnormalities on inspection of external nose and ears, normal appearance of ear canals and TMs, clear nasal congestion, mild  post oropharyngeal erythema with PND, no tonsillar edema or exudate, no sinus TTP  NECK: no obvious masses on inspection  LUNGS: clear to auscultation bilaterally, no wheezes, rales or rhonchi, good air movement  CV: HRRR, no peripheral edema  MS: moves all extremities without noticeable abnormality  PSYCH: pleasant and cooperative, no obvious depression or anxiety  ASSESSMENT AND PLAN:  Discussed the following assessment and plan:  Influenza  Cough - Plan: DG Chest 2 View  Essential hypertension  BMI 32.0-32.9,adult  -CXR -tessalon and prednisone -needs cpe, labs and BP a little high she agrees to follow up in 1 month and do labs then -of course, we advised to return or notify  a doctor immediately if symptoms worsen or persist or new concerns arise.    Patient Instructions  BEFORE YOU LEAVE: -xray sheet -follow up: PHYSICAL in 1 month - come fasting for labs  Go get the chest xray.  Tessalon as needed for cough.  Prednisone for 4 days.  Follow up sooner if worsening, new concerns or symptoms persist.    Kriste Basque R., DO

## 2016-08-12 NOTE — Progress Notes (Signed)
Pre visit review using our clinic review tool, if applicable. No additional management support is needed unless otherwise documented below in the visit note. 

## 2016-08-12 NOTE — Patient Instructions (Signed)
BEFORE YOU LEAVE: -xray sheet -follow up: PHYSICAL in 1 month - come fasting for labs  Go get the chest xray.  Tessalon as needed for cough.  Prednisone for 4 days.  Follow up sooner if worsening, new concerns or symptoms persist.

## 2016-09-16 NOTE — Progress Notes (Deleted)
HPI:  Here for CPE:  -Concerns and/or follow up today:  PMH poor follow up compliance, GAD, Depression, fibromyalgia, HTN, mild hyperglycemia and obesity. Due for labs, pap, flu vaccine  -Diet: variety of foods, balance and well rounded, larger portion sizes  -Exercise: no regular exercise  -Taking folic acid, vitamin D or calcium: no  -Diabetes and Dyslipidemia Screening:  -Hx of HTN: no  -Vaccines: UTD  -pap history: 11/2012, neg  -FDLMP:  -sexual activity: yes, female partner, no new partners  -wants STI testing (Hep C if born [birth year]): no  -FH breast, colon or ovarian ca: see FH Last mammogram: Last colon cancer screening:  Breast Ca Risk Assessment: -[email or web address]  Genetic Counseling Screen: Http://www.breastcancergenesscreen.org/startScreen.aspx  FRAX (50-65):  DEXA (>/= 65):   -Alcohol, Tobacco, drug use: see social history  Review of Systems - no fevers, unintentional weight loss, vision loss, hearing loss, chest pain, sob, hemoptysis, melena, hematochezia, hematuria, genital discharge, changing or concerning skin lesions, bleeding, bruising, loc, thoughts of self harm or SI  Past Medical History:  Diagnosis Date  . Chronic headaches   . Depression   . Fibromyalgia   . Hypertension   . Insomnia   . MRSA (methicillin resistant Staphylococcus aureus) infection     Past Surgical History:  Procedure Laterality Date  . APPENDECTOMY  2005  . CESAREAN SECTION    . TUBAL LIGATION  1998    Family History  Problem Relation Age of Onset  . Alcohol abuse Mother   . Hypertension Mother   . Drug abuse Father   . Hypertension Father   . Depression Paternal Aunt   . Depression Paternal Uncle   . Alcohol abuse Maternal Grandmother   . Arthritis Maternal Grandmother   . Cancer Maternal Grandmother   . Hypertension Maternal Grandmother   . Alcohol abuse Maternal Grandfather   . Hypertension Maternal Grandfather   . Arthritis  Paternal Grandmother   . Cancer Paternal Grandmother   . Hypertension Paternal Grandmother   . Hypertension Paternal Grandfather   . Asthma Paternal Grandmother   . Asthma Brother   . Asthma Daughter   . Asthma Daughter   . Emphysema Father     Social History   Social History  . Marital status: Legally Separated    Spouse name: N/A  . Number of children: N/A  . Years of education: N/A   Occupational History  . RN    Social History Main Topics  . Smoking status: Never Smoker  . Smokeless tobacco: Never Used  . Alcohol use No  . Drug use: No  . Sexual activity: Not on file   Other Topics Concern  . 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  benzonatate (TESSALON) 100 MG capsule, Take 1 capsule (100 mg total) by mouth 3 (three) times daily as needed for cough., Disp: 20 capsule, Rfl: 0 .  hydrochlorothiazide (HYDRODIURIL) 25 MG tablet, TAKE 1 TABLET(25 MG) BY MOUTH DAILY, Disp: 90 tablet, Rfl: 1 .  Multiple Vitamin (MULTIVITAMIN) tablet, Take 1 tablet by mouth daily., Disp: , Rfl:  .  predniSONE (DELTASONE) 20 MG tablet, Take 1 tablet (20 mg total)  by mouth daily with breakfast., Disp: 4 tablet, Rfl: 0 .  Vitamin D, Ergocalciferol, (DRISDOL) 50000 units CAPS capsule, TK ONE C PO ONCE A WEEK, Disp: , Rfl: 0  EXAM:  There were no vitals filed for this visit.  GENERAL: vitals reviewed and listed below, alert, oriented, appears well hydrated and in no acute distress  HEENT: head atraumatic, PERRLA, normal appearance of eyes, ears, nose and mouth. moist mucus membranes.  NECK: supple, no masses or lymphadenopathy  LUNGS: clear to auscultation bilaterally, no rales, rhonchi or wheeze  CV: HRRR, no peripheral edema or  cyanosis, normal pedal pulses  ABDOMEN: bowel sounds normal, soft, non tender to palpation, no masses, no rebound or guarding  SKIN: no rash or abnormal lesions  MS: normal gait, moves all extremities normally  NEURO: normal gait, speech and thought processing grossly intact, muscle tone grossly intact throughout  PSYCH: normal affect, pleasant and cooperative  ASSESSMENT AND PLAN:  Discussed the following assessment and plan:  There are no diagnoses linked to this encounter.  -Discussed and advised all [place] preventive services health task force level A and B recommendations for age, sex and risks.  -Advised at least 150 minutes of exercise per week and a healthy diet with avoidance of (less then 1 serving per week) processed foods, white starches, red meat, fast foods and sweets and consisting of: * 5-9 servings of fresh fruits and vegetables (not corn or potatoes) *nuts and seeds, beans *olives and olive oil *lean meats such as fish and white chicken  *whole grains  -labs, studies and vaccines per orders this encounter  No orders of the defined types were placed in this encounter.   Patient advised to return to clinic immediately if symptoms worsen or persist or new concerns.  There are no Patient Instructions on file for this visit.  No Follow-up on file.  Kriste BasqueKIM, Aneya Daddona R., DO

## 2016-09-17 ENCOUNTER — Encounter: Payer: Self-pay | Admitting: Family Medicine

## 2016-09-17 ENCOUNTER — Encounter: Payer: Managed Care, Other (non HMO) | Admitting: Family Medicine

## 2016-10-01 ENCOUNTER — Encounter: Payer: Self-pay | Admitting: Family Medicine

## 2016-10-01 ENCOUNTER — Ambulatory Visit (INDEPENDENT_AMBULATORY_CARE_PROVIDER_SITE_OTHER): Payer: Managed Care, Other (non HMO) | Admitting: Family Medicine

## 2016-10-01 VITALS — BP 126/88 | HR 80 | Temp 98.2°F | Ht 61.0 in | Wt 178.4 lb

## 2016-10-01 DIAGNOSIS — Z6833 Body mass index (BMI) 33.0-33.9, adult: Secondary | ICD-10-CM | POA: Diagnosis not present

## 2016-10-01 DIAGNOSIS — I10 Essential (primary) hypertension: Secondary | ICD-10-CM | POA: Diagnosis not present

## 2016-10-01 DIAGNOSIS — Z0000 Encounter for general adult medical examination without abnormal findings: Secondary | ICD-10-CM | POA: Diagnosis not present

## 2016-10-01 DIAGNOSIS — R739 Hyperglycemia, unspecified: Secondary | ICD-10-CM | POA: Diagnosis not present

## 2016-10-01 DIAGNOSIS — E559 Vitamin D deficiency, unspecified: Secondary | ICD-10-CM | POA: Diagnosis not present

## 2016-10-01 DIAGNOSIS — M329 Systemic lupus erythematosus, unspecified: Secondary | ICD-10-CM

## 2016-10-01 DIAGNOSIS — G4733 Obstructive sleep apnea (adult) (pediatric): Secondary | ICD-10-CM

## 2016-10-01 HISTORY — DX: Systemic lupus erythematosus, unspecified: M32.9

## 2016-10-01 LAB — CBC WITH DIFFERENTIAL/PLATELET
BASOS PCT: 0.8 % (ref 0.0–3.0)
Basophils Absolute: 0 10*3/uL (ref 0.0–0.1)
EOS ABS: 0 10*3/uL (ref 0.0–0.7)
Eosinophils Relative: 0.8 % (ref 0.0–5.0)
HEMATOCRIT: 39.2 % (ref 36.0–46.0)
HEMOGLOBIN: 13.1 g/dL (ref 12.0–15.0)
LYMPHS PCT: 28.9 % (ref 12.0–46.0)
Lymphs Abs: 1.7 10*3/uL (ref 0.7–4.0)
MCHC: 33.4 g/dL (ref 30.0–36.0)
MCV: 86.9 fl (ref 78.0–100.0)
MONOS PCT: 6.5 % (ref 3.0–12.0)
Monocytes Absolute: 0.4 10*3/uL (ref 0.1–1.0)
Neutro Abs: 3.7 10*3/uL (ref 1.4–7.7)
Neutrophils Relative %: 63 % (ref 43.0–77.0)
Platelets: 378 10*3/uL (ref 150.0–400.0)
RBC: 4.52 Mil/uL (ref 3.87–5.11)
RDW: 13.7 % (ref 11.5–15.5)
WBC: 5.8 10*3/uL (ref 4.0–10.5)

## 2016-10-01 LAB — LIPID PANEL
Cholesterol: 173 mg/dL (ref 0–200)
HDL: 65.4 mg/dL (ref >39.00)
LDL Cholesterol: 99 mg/dL (ref 0–99)
NonHDL: 107.66
TRIGLYCERIDES: 45 mg/dL (ref 0.0–149.0)
Total CHOL/HDL Ratio: 3
VLDL: 9 mg/dL (ref 0.0–40.0)

## 2016-10-01 LAB — BASIC METABOLIC PANEL
BUN: 13 mg/dL (ref 6–23)
CHLORIDE: 104 meq/L (ref 96–112)
CO2: 28 mEq/L (ref 19–32)
CREATININE: 0.65 mg/dL (ref 0.40–1.20)
Calcium: 9.1 mg/dL (ref 8.4–10.5)
GFR: 129.18 mL/min (ref >60.00)
Glucose, Bld: 92 mg/dL (ref 70–99)
Potassium: 4 mEq/L (ref 3.5–5.1)
SODIUM: 138 meq/L (ref 135–145)

## 2016-10-01 LAB — HEMOGLOBIN A1C: HEMOGLOBIN A1C: 5.6 % (ref 4.6–6.5)

## 2016-10-01 NOTE — Progress Notes (Signed)
HPI:  Here for CPE:  -Concerns and/or follow up today: none Sees Dr. Cherly Hensen in gyn - pap not utd in epic. Hx HTN, Obesity, Fibromyalgia, OSA (sees pulm) and now Lupus - is seeing dermatologist and rheumatologist (Dr. Kathi Ludwig) tomorrow. Due for labs. Not using her CPAP, no taking her BP medication but is monitoring it at home, not taking vit D.  -Diet: variety of foods, balance and well rounded, larger portion sizes and skipping meals - not enough veggies and fruits  -Exercise: no regular exercise  -Taking folic acid, vitamin D or calcium: no  -Diabetes and Dyslipidemia Screening: FASTING for labs  -Vaccines: UTD  -pap history: sees gyn for this, has appt with Dr. Cherly Hensen  -sexual activity: yes, female partner, no new partners  -wants STI testing (Hep C if born [birth year]): no  -FH breast, colon or ovarian ca: see FH Last mammogram: sees Dr. Cherly Hensen for breast ecams Last colon cancer screening: n/a  -Alcohol, Tobacco, drug use: see social history  Review of Systems - no fevers, unintentional weight loss, vision loss, hearing loss, chest pain, sob, hemoptysis, melena, hematochezia, hematuria, genital discharge, changing or concerning skin lesions, bleeding, bruising, loc, thoughts of self harm or SI  Past Medical History:  Diagnosis Date  . Chronic headaches   . Depression   . Fibromyalgia   . Hypertension   . Insomnia   . MRSA (methicillin resistant Staphylococcus aureus) infection   . OSA (obstructive sleep apnea) 03/24/2015  . Systemic lupus erythematosus (HCC) 10/01/2016   -seeing dermatology and rheumatologist (Dr. Kathi Ludwig)    Past Surgical History:  Procedure Laterality Date  . APPENDECTOMY  2005  . CESAREAN SECTION    . TUBAL LIGATION  1998    Family History  Problem Relation Age of Onset  . Alcohol abuse Mother   . Hypertension Mother   . Drug abuse Father   . Hypertension Father   . Depression Paternal Aunt   . Depression Paternal Uncle   . Alcohol abuse  Maternal Grandmother   . Arthritis Maternal Grandmother   . Cancer Maternal Grandmother   . Hypertension Maternal Grandmother   . Alcohol abuse Maternal Grandfather   . Hypertension Maternal Grandfather   . Arthritis Paternal Grandmother   . Cancer Paternal Grandmother   . Hypertension Paternal Grandmother   . Hypertension Paternal Grandfather   . Asthma Paternal Grandmother   . Asthma Brother   . Asthma Daughter   . Asthma Daughter   . Emphysema Father     Social History   Social History  . Marital status: Legally Separated    Spouse name: N/A  . Number of children: N/A  . Years of education: N/A   Occupational History  . RN    Social History Main Topics  . Smoking status: Never Smoker  . Smokeless tobacco: Never Used  . Alcohol use No  . Drug use: No  . Sexual activity: Not Asked   Other Topics Concern  . 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  hydrochlorothiazide (HYDRODIURIL) 25 MG tablet, TAKE 1 TABLET(25 MG) BY MOUTH DAILY, Disp: 90 tablet, Rfl: 1 .  Multiple Vitamin (MULTIVITAMIN)  tablet, Take 1 tablet by mouth daily., Disp: , Rfl:  .  Vitamin D, Ergocalciferol, (DRISDOL) 50000 units CAPS capsule, TK ONE C PO ONCE A WEEK, Disp: , Rfl: 0  EXAM:  Vitals:   10/01/16 1140  BP: 126/88  Pulse: 80  Temp: 98.2 F (36.8 C)   Body mass index is 33.71 kg/m.  GENERAL: vitals reviewed and listed below, alert, oriented, appears well hydrated and in no acute distress  HEENT: head atraumatic, PERRLA, normal appearance of eyes, ears, nose and mouth. moist mucus membranes.  NECK: supple, no masses or lymphadenopathy  LUNGS: clear to auscultation bilaterally, no rales, rhonchi or wheeze  CV: HRRR, no peripheral  edema or cyanosis, normal pedal pulses  ABDOMEN: bowel sounds normal, soft, non tender to palpation, no masses, no rebound or guarding  SKIN: no rash or abnormal lesions except for punched out lesion R ear similar to prior  GU declined - does with Dr. Cherly Hensen  BREAST: declined, does with Dr. Janyce Llanos  MS: normal gait, moves all extremities normally  NEURO: normal gait, speech and thought processing grossly intact, muscle tone grossly intact throughout  PSYCH: normal affect, pleasant and cooperative  ASSESSMENT AND PLAN:  Discussed the following assessment and plan:  Encounter for preventive health examination -Discussed and advised all [place] preventive services health task force level A and B recommendations for age, sex and risks.  -Advised at least 150 minutes of exercise per week and a healthy diet with avoidance of (less then 1 serving per week) processed foods, white starches, red meat, fast foods and sweets and consisting of: * 5-9 servings of fresh fruits and vegetables (not corn or potatoes) *nuts and seeds, beans *olives and olive oil *lean meats such as fish and white chicken  *whole grains  -labs, studies and vaccines per orders this encounter  Essential hypertension - Plan: Basic metabolic panel, CBC with Differential/Platelet -labs, lifestyle recs, advised to take her medication -she wants to monitor at home rather then taking medication, but agrees to take medication if running high - recheck 3 months  OSA (obstructive sleep apnea) -advised she use CPAP and follow up with her specialist  Hyperglycemia - Plan: Hemoglobin A1c BMI 33.0-33.9,adult - Plan: Lipid panel -lifestyle recs  Vitamin D deficiency -she asks about OTC options, discussed various options and good products according to consumer lab report  Lupus: -seeing specialist for management    Orders Placed This Encounter  Procedures  . Basic metabolic panel  . CBC with Differential/Platelet  .  Lipid panel  . Hemoglobin A1c    Patient advised to return to clinic immediately if symptoms worsen or persist or new concerns.  Patient Instructions  BEFORE YOU LEAVE: -follow up: 3 months -labs  Please take vit D3 1000-2000 IU daily (source naturals Vit D3 drops available on [employer] is a good product according to a recent consumer lab report)  See Dr. Cherly Hensen as planned.  We have ordered labs or studies at this visit. It can take up to 1-2 weeks for results and processing. IF results require follow up or explanation, we will call you with instructions. Clinically stable results will be released to your [facility]. If you have not heard from [place] or cannot find your results in [facility] in 2 weeks please contact our office at [phone].  If you are not yet signed up for [facility], please consider signing up.   We recommend the following healthy lifestyle for LIFE: 1) Small portions - regular small meals.  Tip: eat off of a salad plate instead of a dinner plate.  Tip: It is ok to feel hungry after a meal of proper portion sice  Tip: if you need more or a snack choose fruits, veggies and/or a handful of nuts or seeds.  2) Eat a healthy clean diet.  * Tip: Avoid (less then 1 serving per week): processed foods, sweets, sweetened drinks, white starches (rice, flour, bread, potatoes, pasta, etc), red meat, fast foods, butter  *Tip: CHOOSE instead   * 5-9 servings per day of fresh or frozen fruits and vegetables (but not corn, potatoes, bananas, canned or dried fruit)   *nuts and seeds, beans   *olives and olive oil   *small portions of lean meats such as fish and white chicken    *small portions of whole grains  3)Get at least 150 minutes of sweaty aerobic exercise per week.  4)Reduce stress - consider counseling, meditation and relaxation to balance other aspects of your life.   WE NOW OFFER   [facility] FAST TRACK!!!  SAME DAY Appointments for ACUTE CARE  Such  as: Sprains, Injuries, cuts, abrasions, rashes, muscle pain, joint pain, back pain Colds, flu, sore throats, headache, allergies, cough, fever  Ear pain, sinus and eye infections Abdominal pain, nausea, vomiting, diarrhea, upset stomach Animal/insect bites  3 Easy Ways to Schedule: Walk-In Scheduling Call in scheduling Mychart Sign-up: [email or web address]             No Follow-up on file.  Kriste Basque R., DO

## 2016-10-01 NOTE — Patient Instructions (Signed)
BEFORE YOU LEAVE: -follow up: 3 months -labs  Please take vit D3 1000-2000 IU daily (source naturals Vit D3 drops available on [employer] is a good product according to a recent consumer lab report)  See Dr. Cherly Hensen as planned.  We have ordered labs or studies at this visit. It can take up to 1-2 weeks for results and processing. IF results require follow up or explanation, we will call you with instructions. Clinically stable results will be released to your [facility]. If you have not heard from [place] or cannot find your results in [facility] in 2 weeks please contact our office at [phone].  If you are not yet signed up for [facility], please consider signing up.   We recommend the following healthy lifestyle for LIFE: 1) Small portions - regular small meals.   Tip: eat off of a salad plate instead of a dinner plate.  Tip: It is ok to feel hungry after a meal of proper portion sice  Tip: if you need more or a snack choose fruits, veggies and/or a handful of nuts or seeds.  2) Eat a healthy clean diet.  * Tip: Avoid (less then 1 serving per week): processed foods, sweets, sweetened drinks, white starches (rice, flour, bread, potatoes, pasta, etc), red meat, fast foods, butter  *Tip: CHOOSE instead   * 5-9 servings per day of fresh or frozen fruits and vegetables (but not corn, potatoes, bananas, canned or dried fruit)   *nuts and seeds, beans   *olives and olive oil   *small portions of lean meats such as fish and white chicken    *small portions of whole grains  3)Get at least 150 minutes of sweaty aerobic exercise per week.  4)Reduce stress - consider counseling, meditation and relaxation to balance other aspects of your life.   WE NOW OFFER   [facility] FAST TRACK!!!  SAME DAY Appointments for ACUTE CARE  Such as: Sprains, Injuries, cuts, abrasions, rashes, muscle pain, joint pain, back pain Colds, flu, sore throats, headache, allergies, cough, fever  Ear pain, sinus and  eye infections Abdominal pain, nausea, vomiting, diarrhea, upset stomach Animal/insect bites  3 Easy Ways to Schedule: Walk-In Scheduling Call in scheduling Mychart Sign-up: [email or web address]

## 2016-10-01 NOTE — Progress Notes (Signed)
Pre visit review using our clinic review tool, if applicable. No additional management support is needed unless otherwise documented below in the visit note. 

## 2017-03-16 IMAGING — CR DG TOE 2ND 2+V*L*
3 series · 3 of 3 positions shown · non-contrast
Comparison: None.

CLINICAL DATA: Left second toe pain and swelling, onset last night.
No known trauma.

EXAM:
LEFT SECOND TOE

[x toes ap left]
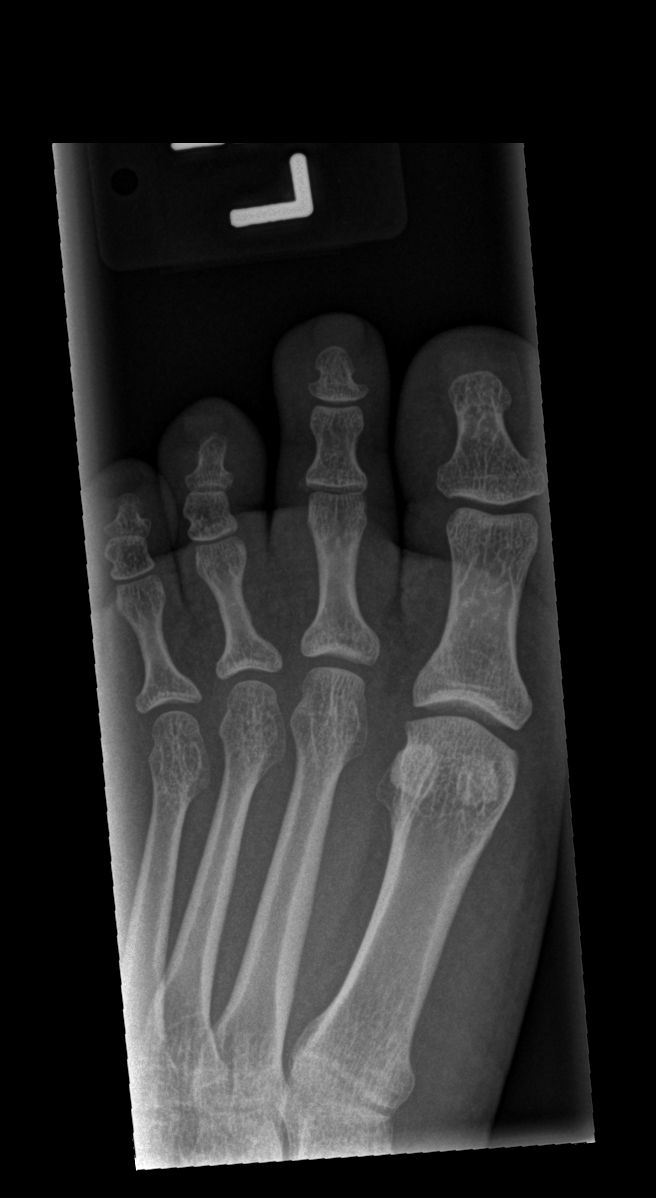

[x toes obl left]
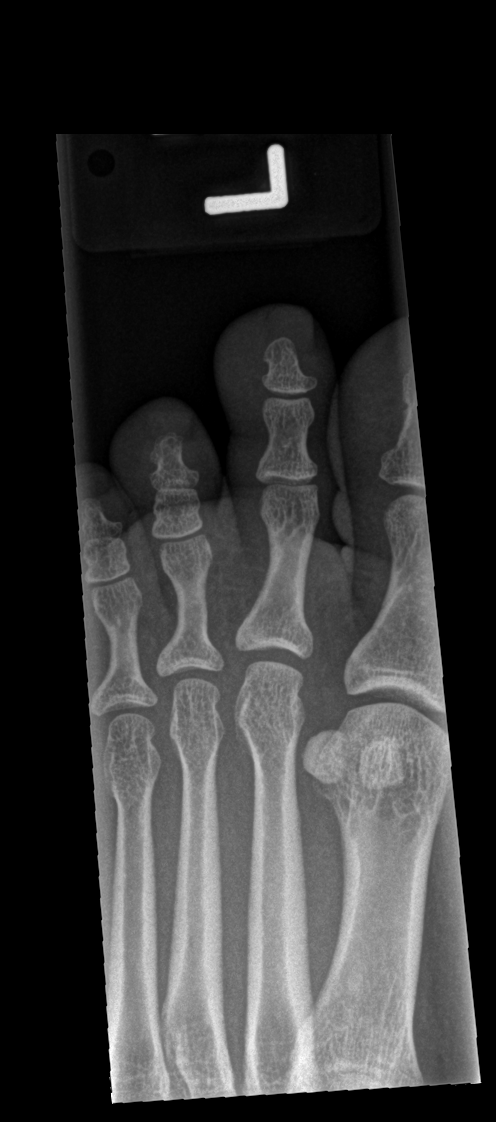

[x toes lat left]
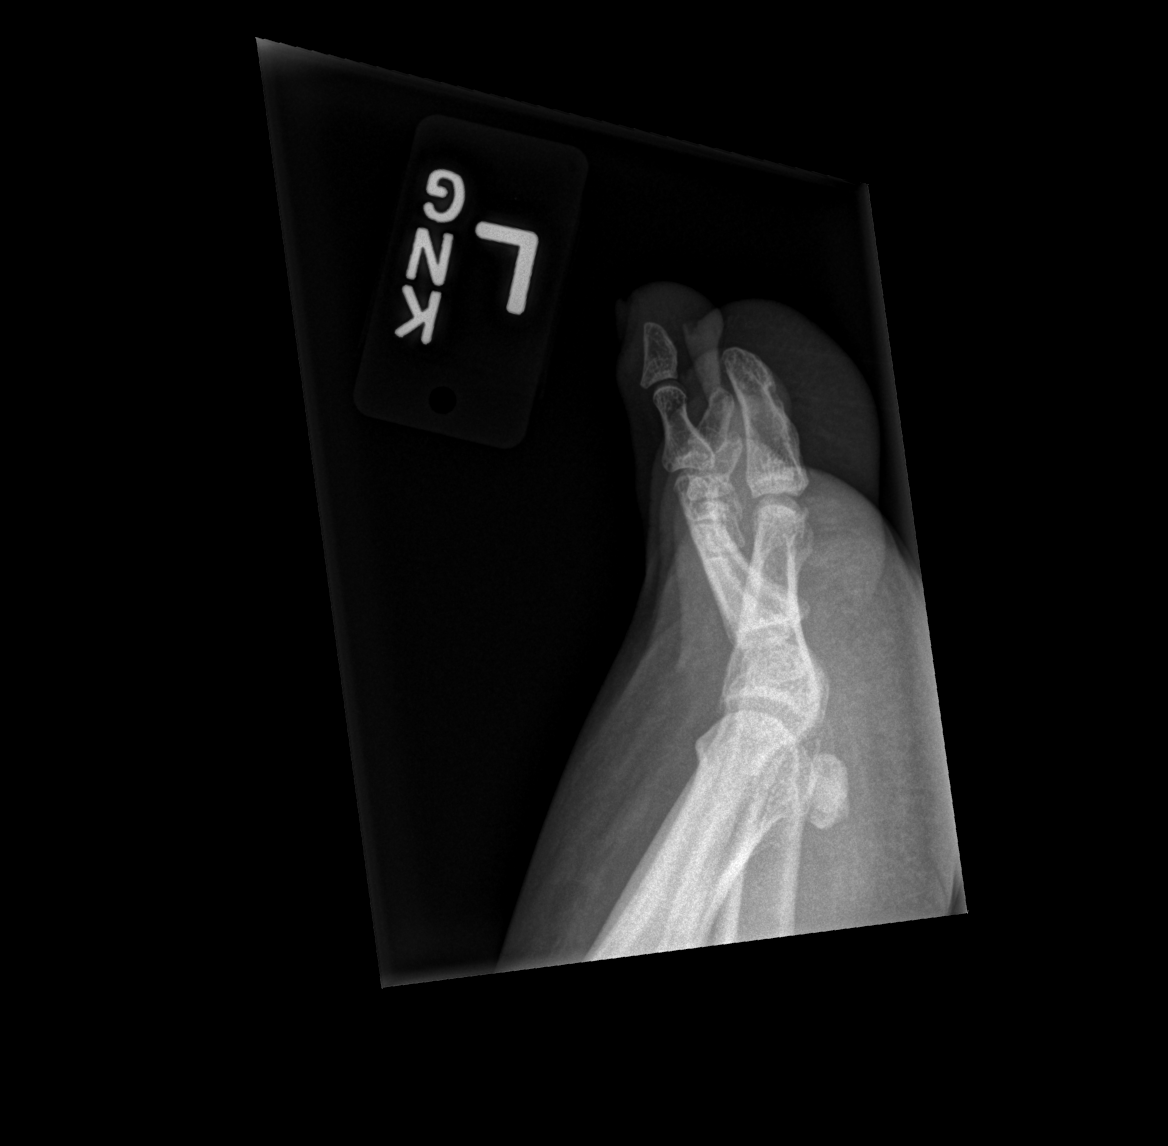

[3 of 3 positions shown; findings below may reference images not displayed]

FINDINGS: There is no evidence of fracture or dislocation. There is no
evidence of arthropathy or other focal bone abnormality. Soft
tissues are unremarkable.
IMPRESSION: Negative.

## 2017-03-20 ENCOUNTER — Encounter: Payer: Self-pay | Admitting: Family Medicine

## 2017-04-08 ENCOUNTER — Emergency Department (HOSPITAL_COMMUNITY): Payer: 59

## 2017-04-08 ENCOUNTER — Telehealth: Payer: Self-pay | Admitting: *Deleted

## 2017-04-08 ENCOUNTER — Other Ambulatory Visit: Payer: Self-pay

## 2017-04-08 ENCOUNTER — Emergency Department (HOSPITAL_COMMUNITY)
Admission: EM | Admit: 2017-04-08 | Discharge: 2017-04-09 | Disposition: A | Discharge status: Home / Self Care | Payer: 59 | Attending: Emergency Medicine | Admitting: Emergency Medicine

## 2017-04-08 DIAGNOSIS — I10 Essential (primary) hypertension: Secondary | ICD-10-CM | POA: Diagnosis not present

## 2017-04-08 DIAGNOSIS — R0789 Other chest pain: Secondary | ICD-10-CM | POA: Diagnosis not present

## 2017-04-08 DIAGNOSIS — Z79899 Other long term (current) drug therapy: Secondary | ICD-10-CM | POA: Insufficient documentation

## 2017-04-08 DIAGNOSIS — R079 Chest pain, unspecified: Secondary | ICD-10-CM | POA: Diagnosis present

## 2017-04-08 LAB — BASIC METABOLIC PANEL
Anion gap: 12 (ref 5–15)
BUN: 12 mg/dL (ref 6–20)
CO2: 23 mmol/L (ref 22–32)
CREATININE: 0.76 mg/dL (ref 0.44–1.00)
Calcium: 9 mg/dL (ref 8.9–10.3)
Chloride: 104 mmol/L (ref 101–111)
GFR calc Af Amer: >60 mL/min (ref >60)
GLUCOSE: 107 mg/dL — AB (ref 65–99)
POTASSIUM: 3.4 mmol/L — AB (ref 3.5–5.1)
Sodium: 139 mmol/L (ref 135–145)

## 2017-04-08 LAB — POCT I-STAT TROPONIN I: Troponin i, poc: 0 ng/mL (ref 0.00–0.08)

## 2017-04-08 LAB — CBC
HEMATOCRIT: 38.1 % (ref 36.0–46.0)
Hemoglobin: 12.3 g/dL (ref 12.0–15.0)
MCH: 27.7 pg (ref 26.0–34.0)
MCHC: 32.3 g/dL (ref 30.0–36.0)
MCV: 85.8 fL (ref 78.0–100.0)
PLATELETS: 338 10*3/uL (ref 150–400)
RBC: 4.44 MIL/uL (ref 3.87–5.11)
RDW: 13.1 % (ref 11.5–15.5)
WBC: 9.5 10*3/uL (ref 4.0–10.5)

## 2017-04-08 NOTE — ED Notes (Signed)
Bed: WTR7 Expected date:  Expected time:  Means of arrival:  Comments: 

## 2017-04-08 NOTE — Telephone Encounter (Signed)
Patient reports she woke up with morning with 7-9/10 chest pain that has continued intermittently throughout the day.  Pain is currently 5-6/10.  The pain does not worsen with exertion. She has associated L sided shoulder and arm pain.  She has tried stretching and tai chi to relieve pain, which has only been minimally helpful. She denies cough and SOB, but does experience sharper pain when taking a deep breath. Given L sided chest pain and h/o HTN, recommended immediate ED evaluation for patient. Patient agreed to instructions and plan.

## 2017-04-09 LAB — POCT I-STAT TROPONIN I: TROPONIN I, POC: 0 ng/mL (ref 0.00–0.08)

## 2017-04-09 LAB — D-DIMER, QUANTITATIVE: D-Dimer, Quant: 0.33 ug/mL-FEU (ref 0.00–0.50)

## 2017-04-09 LAB — I-STAT BETA HCG BLOOD, ED (NOT ORDERABLE): I-stat hCG, quantitative: <5 m[IU]/mL (ref <5)

## 2017-04-09 NOTE — Discharge Instructions (Signed)
You may alternate Tylenol 1000 mg every 6 hours as needed for pain and Ibuprofen 800 mg every 8 hours as needed for pain.  Please take Ibuprofen with food.   You have had 2 negative sets of cardiac enzymes. Your chest x-ray is clear. Your EKG is normal. Your d-dimer was negative. Pregnancy test negative. Please follow-up closely with your primary care physician if symptoms do not improve.

## 2017-04-09 NOTE — ED Provider Notes (Signed)
TIME SEEN: 12:04 AM  CHIEF COMPLAINT: chest pain  HPI: Pt is a 41 year old female with history of hypertension, fibromyalgia, lupus who presents emergency department with complaints of central chest pain that started this morning. Pain is worse with deep inspiration and stretching. She denies any injury to the chest or any increased physical exertion that could've injured her. Pain is not exertional in nature. She denies shortness of breath, nausea, vomiting, diaphoresis. Has felt dizzy. No history of PE or DVT. No recent surgery, fracture, trauma, estrogen use. No calf tenderness or swelling.No history of premature CAD within her family. She is nonsmoker. No history of diabetes or hyperlipidemia.  ROS: See HPI Constitutional: no fever  Eyes: no drainage  ENT: no runny nose   Cardiovascular:  chest pain  Resp: no SOB  GI: no vomiting GU: no dysuria Integumentary: no rash  Allergy: no hives  Musculoskeletal: no leg swelling  Neurological: no slurred speech ROS otherwise negative  PAST MEDICAL HISTORY/PAST SURGICAL HISTORY:  Past Medical History:  Diagnosis Date  . Chronic headaches   . Depression   . Fibromyalgia   . Hypertension   . Insomnia   . MRSA (methicillin resistant Staphylococcus aureus) infection   . OSA (obstructive sleep apnea) 03/24/2015  . Systemic lupus erythematosus (HCC) 10/01/2016   -seeing dermatology and rheumatologist (Dr. Kathi Ludwig)    MEDICATIONS:  Prior to Admission medications   Medication Sig Start Date End Date Taking? Authorizing Provider  hydrochlorothiazide (HYDRODIURIL) 25 MG tablet TAKE 1 TABLET(25 MG) BY MOUTH DAILY 12/04/15   Terressa Koyanagi, DO  Multiple Vitamin (MULTIVITAMIN) tablet Take 1 tablet by mouth daily.    [provider]  Vitamin D, Ergocalciferol, (DRISDOL) 50000 units CAPS capsule TK ONE C PO ONCE A WEEK 10/21/15   [provider]    ALLERGIES:  Allergies  Allergen Reactions  . Dexamethasone     Severe vaginal and  rectal burning  . Iohexol      Desc: HIVES   . Ivp Dye [Iodinated Diagnostic Agents]   . Sulfa Antibiotics Hives    SOCIAL HISTORY:  Social History  Substance Use Topics  . Smoking status: Never Smoker  . Smokeless tobacco: Never Used  . Alcohol use No    FAMILY HISTORY: Family History  Problem Relation Age of Onset  . Alcohol abuse Mother   . Hypertension Mother   . Drug abuse Father   . Hypertension Father   . Depression Paternal Aunt   . Depression Paternal Uncle   . Alcohol abuse Maternal Grandmother   . Arthritis Maternal Grandmother   . Cancer Maternal Grandmother   . Hypertension Maternal Grandmother   . Alcohol abuse Maternal Grandfather   . Hypertension Maternal Grandfather   . Arthritis Paternal Grandmother   . Cancer Paternal Grandmother   . Hypertension Paternal Grandmother   . Hypertension Paternal Grandfather   . Asthma Paternal Grandmother   . Asthma Brother   . Asthma Daughter   . Asthma Daughter   . Emphysema Father     EXAM: BP (!) 154/94 (BP Location: Left Arm)   Pulse 94   Temp 98.8 F (37.1 C) (Oral)   Resp 18   Ht  (1.549 m)   Wt 74.3 kg (163 lb 12.8 oz)   LMP 04/01/2017   SpO2 100%   BMI 30.95 kg/m  CONSTITUTIONAL: Alert and oriented and responds appropriately to questions. Well-appearing; well-nourished HEAD: Normocephalic EYES: Conjunctivae clear, pupils appear equal, EOMI ENT: normal nose; moist mucous  membranes NECK: Supple, no meningismus, no nuchal rigidity, no LAD  CARD: RRR; S1 and S2 appreciated; no murmurs, no clicks, no rubs, no gallops CHEST:  Chest wall is nontender to palpation.  No crepitus, ecchymosis, erythema, warmth, rash or other lesions present.   RESP: Normal chest excursion without splinting or tachypnea; breath sounds clear and equal bilaterally; no wheezes, no rhonchi, no rales, no hypoxia or respiratory distress, speaking full sentences ABD/GI: Normal bowel sounds; non-distended; soft, non-tender, no  rebound, no guarding, no peritoneal signs, no hepatosplenomegaly BACK:  The back appears normal and is non-tender to palpation, there is no CVA tenderness EXT: Normal ROM in all joints; non-tender to palpation; no edema; normal capillary refill; no cyanosis, no calf tenderness or swelling    SKIN: Normal color for age and race; warm; no rash NEURO: Moves all extremities equally PSYCH: The patient's mood and manner are appropriate. Grooming and personal hygiene are appropriate.  MEDICAL DECISION MAKING: Patient complaints of chest pain. I feel it is unlikely ACS. She has had a negative troponin and normal EKG. Chest x-ray clear. Could be PE given her history of lupus but she is still low risk. Will obtain d-dimer and repeat a second troponin. Patient comfortable with this plan. She declines pain medicine at this time.  Doubt dissection. No sign of pneumonia.  ED PROGRESS: Second troponin negative. Pregnancy test negative. D-dimer negative. I comfortable with discharging patient home with close outpatient follow-up. Recommended alternating Tylenol and Motrin for pain. She is also comfortable with this plan. We did discuss return precautions.  At this time, I do not feel there is any life-threatening condition present. I have reviewed and discussed all results (EKG, imaging, lab, urine as appropriate) and exam findings with patient/family. I have reviewed nursing notes and appropriate previous records.  I feel the patient is safe to be discharged home without further emergent workup and can continue workup as an outpatient as needed. Discussed usual and customary return precautions. Patient/family verbalize understanding and are comfortable with this plan.  Outpatient follow-up has been provided if needed. All questions have been answered.    EKG Interpretation  Date/Time:  Tuesday April 08 2017 20:12:43 EDT Ventricular Rate:  90 PR Interval:    QRS Duration: 87 QT Interval:  346 QTC  Calculation: 424 R Axis:   63 Text Interpretation:  Sinus rhythm No significant change since last tracing Confirmed by Ward, Baxter Hire [phone]) on 04/08/2017 11:32:53 PM         Ward, Layla Maw, DO 04/09/17 4132

## 2017-04-16 ENCOUNTER — Telehealth: Payer: Self-pay | Admitting: Family Medicine

## 2017-04-16 NOTE — Telephone Encounter (Signed)
Pt states she was seen in the ED for chest pain. Pt states everything was OK, and probably a flair of her Lupus. But suggested she may need an ECHO. Would like to know if you will order.

## 2017-04-17 NOTE — Telephone Encounter (Signed)
I left a message for the pt to return my call. 

## 2017-04-17 NOTE — Telephone Encounter (Signed)
Please schedule ER follow up (30 minutes)

## 2017-04-21 NOTE — Telephone Encounter (Signed)
I called the pt and informed her of the message below.  Patient stated she needs an appt for a Wednesday as that is her only day off.  Message sent to Dr Selena BattenKim as to another provider here that she could see.

## 2017-04-21 NOTE — Telephone Encounter (Signed)
I called the pt and informed her of the message below and she stated she will check her schedule and call back for an appt. 

## 2017-04-21 NOTE — Telephone Encounter (Signed)
Offer her after hours times tuesdays or thursdays (early or late) also early Monday appt,  as I am not here wednesdays. Also could write note to her employer if needed for the appt. thanks

## 2017-07-10 ENCOUNTER — Encounter: Payer: Self-pay | Admitting: Family Medicine

## 2017-07-14 ENCOUNTER — Emergency Department (HOSPITAL_COMMUNITY)
Admission: EM | Admit: 2017-07-14 | Discharge: 2017-07-14 | Disposition: A | Discharge status: Home / Self Care | Payer: 59 | Attending: Emergency Medicine | Admitting: Emergency Medicine

## 2017-07-14 ENCOUNTER — Emergency Department (HOSPITAL_COMMUNITY): Payer: 59

## 2017-07-14 ENCOUNTER — Encounter (HOSPITAL_COMMUNITY): Payer: Self-pay | Admitting: Emergency Medicine

## 2017-07-14 DIAGNOSIS — I10 Essential (primary) hypertension: Secondary | ICD-10-CM | POA: Insufficient documentation

## 2017-07-14 DIAGNOSIS — R109 Unspecified abdominal pain: Secondary | ICD-10-CM | POA: Diagnosis not present

## 2017-07-14 DIAGNOSIS — R0789 Other chest pain: Secondary | ICD-10-CM | POA: Diagnosis present

## 2017-07-14 DIAGNOSIS — Z79899 Other long term (current) drug therapy: Secondary | ICD-10-CM | POA: Diagnosis not present

## 2017-07-14 DIAGNOSIS — M549 Dorsalgia, unspecified: Secondary | ICD-10-CM | POA: Insufficient documentation

## 2017-07-14 LAB — BASIC METABOLIC PANEL
ANION GAP: 8 (ref 5–15)
BUN: 14 mg/dL (ref 6–20)
CHLORIDE: 100 mmol/L — AB (ref 101–111)
CO2: 27 mmol/L (ref 22–32)
Calcium: 9.2 mg/dL (ref 8.9–10.3)
Creatinine, Ser: 0.66 mg/dL (ref 0.44–1.00)
GFR calc Af Amer: >60 mL/min (ref >60)
GFR calc non Af Amer: >60 mL/min (ref >60)
GLUCOSE: 87 mg/dL (ref 65–99)
POTASSIUM: 4 mmol/L (ref 3.5–5.1)
SODIUM: 135 mmol/L (ref 135–145)

## 2017-07-14 LAB — URINALYSIS, ROUTINE W REFLEX MICROSCOPIC
Bilirubin Urine: NEGATIVE
GLUCOSE, UA: NEGATIVE mg/dL
Ketones, ur: NEGATIVE mg/dL
NITRITE: NEGATIVE
PH: 8 (ref 5.0–8.0)
PROTEIN: NEGATIVE mg/dL
Specific Gravity, Urine: 1.011 (ref 1.005–1.030)

## 2017-07-14 LAB — D-DIMER, QUANTITATIVE (NOT AT ARMC): D DIMER QUANT: 0.34 ug{FEU}/mL (ref 0.00–0.50)

## 2017-07-14 LAB — CBC
HCT: 40 % (ref 36.0–46.0)
HEMOGLOBIN: 13.3 g/dL (ref 12.0–15.0)
MCH: 28.7 pg (ref 26.0–34.0)
MCHC: 33.3 g/dL (ref 30.0–36.0)
MCV: 86.4 fL (ref 78.0–100.0)
Platelets: 364 10*3/uL (ref 150–400)
RBC: 4.63 MIL/uL (ref 3.87–5.11)
RDW: 13.2 % (ref 11.5–15.5)
WBC: 6.3 10*3/uL (ref 4.0–10.5)

## 2017-07-14 LAB — I-STAT TROPONIN, ED: Troponin i, poc: 0 ng/mL (ref 0.00–0.08)

## 2017-07-14 LAB — I-STAT BETA HCG BLOOD, ED (MC, WL, AP ONLY)

## 2017-07-14 LAB — PREGNANCY, URINE: Preg Test, Ur: NEGATIVE

## 2017-07-14 NOTE — ED Notes (Signed)
On RN examination, there is bilateral swelling in the posterior chain lymph nodes.

## 2017-07-14 NOTE — ED Provider Notes (Signed)
[facility]-EMERGENCY DEPT Provider Note   CSN: [id number] Arrival date & time: 07/14/17  1018  Patient has moderate hypertension.  Her chest pain is quite atypical.  Given the pleuritic thoracic back pain, evaluation for PE obtained but is negative and otherwise low risk patient.   History   Chief Complaint Chief Complaint  Patient presents with  . Chest Pain  . Hypertension    HPI Sandra Vang is a 42 y.o. female.  HPI  42 year old female presents with a chief complaint of chest pain and hypertension.  She states she has a history of hypertension and typically controls it with herbal medications.  When she does check her blood pressure, typically 2 or 3 times a week, it is around 120/80.  Yesterday she woke up with left-sided dull chest pain.  She has had this multiple times before and been told it is chest wall in etiology.  However her blood pressure was 153/105.  She continued to monitor it and the chest pain remains dull and constant throughout the day.  No associated shortness of breath.  She woke up this morning and had bilateral flank pain.  The pain is constant and seems to be improving except that it is also pleuritic.  It is not bad at rest but does become severe with deep inspiration.  No cough or shortness of breath.  No urinary symptoms.  Denies any abdominal pain or vomiting.  Past Medical History:  Diagnosis Date  . Chronic headaches   . Depression   . Fibromyalgia   . Hypertension   . Insomnia   . MRSA (methicillin resistant Staphylococcus aureus) infection   . OSA (obstructive sleep apnea) 03/24/2015  . Systemic lupus erythematosus (HCC) 10/01/2016   -seeing dermatology and rheumatologist (Dr. Kathi Ludwig)    Patient Active Problem List   Diagnosis Date Noted  . Systemic lupus erythematosus (HCC) 10/01/2016  . Polyarthralgia 09/15/2015  . OSA (obstructive sleep apnea) 03/24/2015  . Fibromyalgia 09/10/2012  . Essential hypertension 08/21/2012    . Obesity 08/21/2012    Past Surgical History:  Procedure Laterality Date  . APPENDECTOMY  2005  . CESAREAN SECTION    . TUBAL LIGATION  1998    OB History    No data available       Home Medications    Prior to Admission medications   Medication Sig Start Date End Date Taking? Authorizing Provider  hydrochlorothiazide (HYDRODIURIL) 25 MG tablet TAKE 1 TABLET(25 MG) BY MOUTH DAILY 12/04/15   Terressa Koyanagi, DO  Multiple Vitamin (MULTIVITAMIN) tablet Take 1 tablet by mouth daily.    [provider]  Vitamin D, Ergocalciferol, (DRISDOL) 50000 units CAPS capsule TK ONE C PO ONCE A WEEK 10/21/15   [provider]    Family History Family History  Problem Relation Age of Onset  . Alcohol abuse Mother   . Hypertension Mother   . Drug abuse Father   . Hypertension Father   . Emphysema Father   . Depression Paternal Aunt   . Depression Paternal Uncle   . Alcohol abuse Maternal Grandmother   . Arthritis Maternal Grandmother   . Cancer Maternal Grandmother   . Hypertension Maternal Grandmother   . Alcohol abuse Maternal Grandfather   . Hypertension Maternal Grandfather   . Arthritis Paternal Grandmother   . Cancer Paternal Grandmother   . Hypertension Paternal Grandmother   . Asthma Paternal Grandmother   . Hypertension Paternal Grandfather   . Asthma Brother   .  Asthma Daughter   . Asthma Daughter     Social History Social History   Tobacco Use  . Smoking status: Never Smoker  . Smokeless tobacco: Never Used  Substance Use Topics  . Alcohol use: No    Alcohol/week: 0.0 oz  . Drug use: No     Allergies   Dexamethasone; Iohexol; Ivp dye [iodinated diagnostic agents]; and Sulfa antibiotics   Review of Systems Review of Systems  Constitutional: Negative for fever.  Respiratory: Negative for cough and shortness of breath.   Cardiovascular: Positive for chest pain.  Gastrointestinal: Negative for abdominal pain and vomiting.  Genitourinary:  Positive for flank pain. Negative for dysuria and hematuria.  Neurological: Negative for weakness and numbness.  All other systems reviewed and are negative.    Physical Exam Updated Vital Signs BP 136/87   Pulse 79   Temp 97.9 F (36.6 C) (Axillary) Comment (Src): Pt was drinking coffee in waiting room so took axillary  Resp 18   Ht 5\' 1"  (1.549 m)   LMP 07/07/2017   SpO2 100%   BMI 30.95 kg/m   Physical Exam  Constitutional: She is oriented to person, place, and time. She appears well-developed and well-nourished. No distress.  HENT:  Head: Normocephalic and atraumatic.  Right Ear: External ear normal.  Left Ear: External ear normal.  Nose: Nose normal.  Eyes: Right eye exhibits no discharge. Left eye exhibits no discharge.  Cardiovascular: Normal rate, regular rhythm and normal heart sounds.  Pulmonary/Chest: Effort normal and breath sounds normal. She exhibits no tenderness.  Abdominal: Soft. There is no tenderness. There is CVA tenderness (bilateral).  Musculoskeletal:       Thoracic back: She exhibits no bony tenderness.       Lumbar back: She exhibits no bony tenderness.  Neurological: She is alert and oriented to person, place, and time.  Skin: Skin is warm and dry. She is not diaphoretic.  Nursing note and vitals reviewed.    ED Treatments / Results  Labs (all labs ordered are listed, but only abnormal results are displayed) Labs Reviewed  BASIC METABOLIC PANEL - Abnormal; Notable for the following components:      Result Value   Chloride 100 (*)    All other components within normal limits  URINALYSIS, ROUTINE W REFLEX MICROSCOPIC - Abnormal; Notable for the following components:   Hgb urine dipstick SMALL (*)    Leukocytes, UA SMALL (*)    Bacteria, UA RARE (*)    Squamous Epithelial / LPF 0-5 (*)    All other components within normal limits  CBC  D-DIMER, QUANTITATIVE (NOT AT [facility])  PREGNANCY, URINE  I-STAT TROPONIN, ED  I-STAT BETA HCG BLOOD, ED  ([facility], [facility], [facility] ONLY)    EKG  EKG Interpretation  Date/Time:  Monday July 14 2017 10:24:25 EST Ventricular Rate:  80 PR Interval:    QRS Duration: 92 QT Interval:  364 QTC Calculation: 420 R Axis:   67 Text Interpretation:  Sinus rhythm Baseline wander in lead(s) V4 no acute ST/T changes no significant change since Oct 2018 Confirmed by Pricilla Loveless [phone]) on 07/14/2017 11:05:43 AM       Radiology Dg Chest 2 View  Result Date: 07/14/2017 CLINICAL DATA:  Sudden onset of left-sided chest pain beginning 2 days ago. EXAM: CHEST  2 VIEW COMPARISON:  04/08/2017 FINDINGS: Heart size is normal. Mediastinal shadows are normal. The lungs are clear. No bronchial thickening. No infiltrate, mass, effusion or collapse. Pulmonary vascularity is normal. No  bony abnormality. IMPRESSION: Normal chest Electronically Signed   By: Paulina FusiMark  Shogry M.D.   On: 07/14/2017 12:13    Procedures Procedures (including critical care time)  Medications Ordered in ED Medications - No data to display   Initial Impression / Assessment and Plan / ED Course  I have reviewed the triage vital signs and the nursing notes.  Pertinent labs & imaging results that were available during my care of the patient were reviewed by me and considered in my medical decision making (see chart for details).     Patient has moderate hypertension with atypical chest pain.  She is well-appearing.  She was evaluated for PE with the pleuritic thoracic back pain.  With negative d-dimer I do not think further workup is needed.  She already has follow-up with her PCP tomorrow and will be advised to address the hypertension there.  No signs or symptoms of hypertensive emergency.  Discharge home with return precautions.  Urinalysis not consistent with UTI.  Final Clinical Impressions(s) / ED Diagnoses   Final diagnoses:  Atypical chest pain  Essential hypertension    ED Discharge Orders    None       Pricilla LovelessGoldston, Malyah Ohlrich, MD 07/14/17  1805

## 2017-07-14 NOTE — ED Notes (Signed)
ED Provider at bedside. 

## 2017-07-14 NOTE — ED Triage Notes (Signed)
Patient reports that she having hypertension and prescribed HCTZ but taking natural products to keep down but had to take HCTZ because BP still to high. Patient c/o general chest pain that is dull and achy since yesterday and today when breathing in. Patient has Lupus and unsure if flare up.

## 2017-07-15 ENCOUNTER — Encounter: Payer: Self-pay | Admitting: Family Medicine

## 2017-07-15 ENCOUNTER — Ambulatory Visit (INDEPENDENT_AMBULATORY_CARE_PROVIDER_SITE_OTHER): Payer: 59 | Admitting: Family Medicine

## 2017-07-15 VITALS — BP 130/80 | HR 90 | Temp 98.3°F | Ht 61.0 in | Wt 182.5 lb

## 2017-07-15 DIAGNOSIS — Z113 Encounter for screening for infections with a predominantly sexual mode of transmission: Secondary | ICD-10-CM | POA: Diagnosis not present

## 2017-07-15 DIAGNOSIS — R599 Enlarged lymph nodes, unspecified: Secondary | ICD-10-CM

## 2017-07-15 DIAGNOSIS — R21 Rash and other nonspecific skin eruption: Secondary | ICD-10-CM | POA: Diagnosis not present

## 2017-07-15 DIAGNOSIS — M329 Systemic lupus erythematosus, unspecified: Secondary | ICD-10-CM

## 2017-07-15 DIAGNOSIS — I10 Essential (primary) hypertension: Secondary | ICD-10-CM

## 2017-07-15 NOTE — Patient Instructions (Addendum)
BEFORE YOU LEAVE: -lab -follow up: 2-3 months  Call your rheumatologist today for an appointment.  Referral to Ear nose and throat was placed regarding the swollen nodes. If you do not have appointment details by the end of the week, please call our office.  Restart blood pressure medicine.

## 2017-07-15 NOTE — Progress Notes (Signed)
HPI:  Acute visit for several issues:  Swollen glad neck: -started 6 weeks ago -bilat -seems a little better -cbc, bmp, urine, Cxr, ddimer unremarkable yesterday -denies fevers, malaise, wt loss, joint pain, myalgias, doe, sob -does have skin rash head and neck like prior lupus - sees rheumatologist but reports has mainly controlled with diet and supplements  HTN: -she stopped her BP medication the last 6 months -treating instead with hawthorne, mag, hibiscus and tumeric and reports -had been feeling fine and bp great at home until yesterday  -had mild cp and elevated BP yesterday so went to ER -symptoms resolved today  -reports neg eval with ekg, labs, cxr  Wants hiv/rpr. Had protected sex once this past summer.  ROS: See pertinent positives and negatives per HPI.  Past Medical History:  Diagnosis Date  . Chronic headaches   . Depression   . Fibromyalgia   . Hypertension   . Insomnia   . MRSA (methicillin resistant Staphylococcus aureus) infection   . OSA (obstructive sleep apnea) 03/24/2015  . Systemic lupus erythematosus (HCC) 10/01/2016   -seeing dermatology and rheumatologist (Dr. Kathi Ludwig)    Past Surgical History:  Procedure Laterality Date  . APPENDECTOMY  2005  . CESAREAN SECTION    . TUBAL LIGATION  1998    Family History  Problem Relation Age of Onset  . Alcohol abuse Mother   . Hypertension Mother   . Drug abuse Father   . Hypertension Father   . Emphysema Father   . Depression Paternal Aunt   . Depression Paternal Uncle   . Alcohol abuse Maternal Grandmother   . Arthritis Maternal Grandmother   . Cancer Maternal Grandmother   . Hypertension Maternal Grandmother   . Alcohol abuse Maternal Grandfather   . Hypertension Maternal Grandfather   . Arthritis Paternal Grandmother   . Cancer Paternal Grandmother   . Hypertension Paternal Grandmother   . Asthma Paternal Grandmother   . Hypertension Paternal Grandfather   . Asthma Brother   . Asthma  Daughter   . Asthma Daughter     Social History   Socioeconomic History  . Marital status: Legally Separated    Spouse name: None  . Number of children: None  . Years of education: None  . Highest education level: None  Social Needs  . Financial resource strain: None  . Food insecurity - worry: None  . Food insecurity - inability: None  . Transportation needs - medical: None  . Transportation needs - non-medical: None  Occupational History  . Occupation: Charity fundraiser  Tobacco Use  . Smoking status: Never Smoker  . Smokeless tobacco: Never Used  Substance and Sexual Activity  . Alcohol use: No    Alcohol/week: 0.0 oz  . Drug use: No  . Sexual activity: None  Other Topics Concern  . 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  hydrochlorothiazide (HYDRODIURIL) 25 MG tablet, TAKE 1 TABLET(25 MG) BY MOUTH DAILY, Disp: 90 tablet, Rfl: 1 .  MAGNESIUM PO, Take by mouth daily., Disp: , Rfl:  .  Multiple Vitamin (MULTIVITAMIN) tablet, Take 1 tablet by mouth daily., Disp: , Rfl:  .  OVER THE COUNTER MEDICATION, Hawthorne, Disp: ,  Rfl:  .  OVER THE COUNTER MEDICATION, Hibiscus, Disp: , Rfl:  .  OVER THE COUNTER MEDICATION, Turmeric 1500mg , Disp: , Rfl:   EXAM:  Vitals:   07/15/17 1519  BP: 130/80  Pulse: 90  Temp: 98.3 F (36.8 C)    Body mass index is 34.48 kg/m.  GENERAL: vitals reviewed and listed above, alert, oriented, appears well hydrated and in no acute distress  HEENT: atraumatic, conjunttiva clear, no obvious abnormalities on inspection of external nose and ears  NECK: post cervical LAD  LUNGS: clear to auscultation bilaterally, no wheezes, rales or rhonchi, good air movement  CV: HRRR, no peripheral edema  SKIN: erythematous  mildly scaly rash on neck anteriorly  MS: moves all extremities without noticeable abnormality  PSYCH: pleasant and cooperative, no obvious depression or anxiety  ASSESSMENT AND PLAN:  Discussed the following assessment and plan:  Glands swollen - Plan: Ambulatory referral to ENT  Essential hypertension  Rash  Systemic lupus erythematosus, unspecified SLE type, unspecified organ involvement status (HCC)  Routine screening for STI (sexually transmitted infection) - Plan: HIV antibody, RPR  -Labs per orders per her request, reviewed labs and studies from emergency room visit yesterday -Advised prompt reevaluation with her rheumatologist for possible flare in her lupus/dermatology evaluation if the rheumatologist feels as needed -She wants to treat her blood pressure with supplements, I am not well equipped to help her with this and advised that she see an integrative doctor if she does not wish to take blood pressure medications for her blood pressure -Discussed a healthy diet and the importance of regular exercise -Referral to ear nose and throat  For cervical LAD -Patient advised to return or notify a doctor immediately if symptoms worsen or persist or new concerns arise.  Patient Instructions  BEFORE YOU LEAVE: -lab -follow up: 2-3 months  Call your rheumatologist today for an appointment.  Referral to Ear nose and throat was placed regarding the swollen nodes. If you do not have appointment details by the end of the week, please call our office.  Restart blood pressure medicine.     Kriste BasqueKIM, HANNAH R., DO

## 2017-07-16 LAB — RPR: RPR: NONREACTIVE

## 2017-07-16 LAB — HIV ANTIBODY (ROUTINE TESTING W REFLEX): HIV: NONREACTIVE

## 2017-07-17 DIAGNOSIS — R590 Localized enlarged lymph nodes: Secondary | ICD-10-CM | POA: Insufficient documentation

## 2017-10-28 ENCOUNTER — Ambulatory Visit (INDEPENDENT_AMBULATORY_CARE_PROVIDER_SITE_OTHER): Payer: 59 | Admitting: Family Medicine

## 2017-10-28 ENCOUNTER — Encounter: Payer: Self-pay | Admitting: Family Medicine

## 2017-10-28 ENCOUNTER — Other Ambulatory Visit: Payer: Self-pay | Admitting: Otolaryngology

## 2017-10-28 VITALS — BP 120/82 | HR 84 | Temp 98.4°F | Ht 61.0 in | Wt 188.1 lb

## 2017-10-28 DIAGNOSIS — R591 Generalized enlarged lymph nodes: Secondary | ICD-10-CM | POA: Diagnosis not present

## 2017-10-28 DIAGNOSIS — M329 Systemic lupus erythematosus, unspecified: Secondary | ICD-10-CM | POA: Diagnosis not present

## 2017-10-28 DIAGNOSIS — I10 Essential (primary) hypertension: Secondary | ICD-10-CM

## 2017-10-28 DIAGNOSIS — Z6835 Body mass index (BMI) 35.0-35.9, adult: Secondary | ICD-10-CM

## 2017-10-28 DIAGNOSIS — R590 Localized enlarged lymph nodes: Secondary | ICD-10-CM

## 2017-10-28 LAB — CBC
HEMATOCRIT: 37 % (ref 36.0–46.0)
Hemoglobin: 12.1 g/dL (ref 12.0–15.0)
MCHC: 32.7 g/dL (ref 30.0–36.0)
MCV: 87.5 fl (ref 78.0–100.0)
PLATELETS: 357 10*3/uL (ref 150.0–400.0)
RBC: 4.23 Mil/uL (ref 3.87–5.11)
RDW: 13.6 % (ref 11.5–15.5)
WBC: 6.7 10*3/uL (ref 4.0–10.5)

## 2017-10-28 LAB — BASIC METABOLIC PANEL
BUN: 12 mg/dL (ref 6–23)
CALCIUM: 9 mg/dL (ref 8.4–10.5)
CHLORIDE: 102 meq/L (ref 96–112)
CO2: 27 meq/L (ref 19–32)
CREATININE: 0.66 mg/dL (ref 0.40–1.20)
GFR: 126.26 mL/min (ref >60.00)
Glucose, Bld: 90 mg/dL (ref 70–99)
Potassium: 4 mEq/L (ref 3.5–5.1)
SODIUM: 138 meq/L (ref 135–145)

## 2017-10-28 NOTE — Progress Notes (Signed)
HPI:  Using dictation device. Unfortunately this device frequently misinterprets words/phrases.  Acute visit for LAD: -showed up very late for her appointment, but we worked her in -reports lymph nodes in neck have remained and have enlarged -we referred her to ENT and her rheumatologist for this - she did not follow back up with the ENT doctor -reports she did see the rheumatologist about 3 months ago and all labs including CMP, cbc and inflammatory labs were negative -denies fevers, unexplained wt loss, malaise -reports always tired but unchanged -denies ear, sinus issues or pain in the nodes Continues off of blood pressure medications.  ROS: See pertinent positives and negatives per HPI.  Past Medical History:  Diagnosis Date  . Chronic headaches   . Depression   . Fibromyalgia   . Hypertension   . Insomnia   . MRSA (methicillin resistant Staphylococcus aureus) infection   . OSA (obstructive sleep apnea) 03/24/2015  . Systemic lupus erythematosus (HCC) 10/01/2016   -seeing dermatology and rheumatologist (Dr. Kathi Ludwig)    Past Surgical History:  Procedure Laterality Date  . APPENDECTOMY  2005  . CESAREAN SECTION    . TUBAL LIGATION  1998    Family History  Problem Relation Age of Onset  . Alcohol abuse Mother   . Hypertension Mother   . Drug abuse Father   . Hypertension Father   . Emphysema Father   . Depression Paternal Aunt   . Depression Paternal Uncle   . Alcohol abuse Maternal Grandmother   . Arthritis Maternal Grandmother   . Cancer Maternal Grandmother   . Hypertension Maternal Grandmother   . Alcohol abuse Maternal Grandfather   . Hypertension Maternal Grandfather   . Arthritis Paternal Grandmother   . Cancer Paternal Grandmother   . Hypertension Paternal Grandmother   . Asthma Paternal Grandmother   . Hypertension Paternal Grandfather   . Asthma Brother   . Asthma Daughter   . Asthma Daughter     SOCIAL HX: see hpi   Current Outpatient  Medications:  .  hydrochlorothiazide (HYDRODIURIL) 25 MG tablet, TAKE 1 TABLET(25 MG) BY MOUTH DAILY, Disp: 90 tablet, Rfl: 1 .  MAGNESIUM PO, Take by mouth daily., Disp: , Rfl:  .  Multiple Vitamin (MULTIVITAMIN) tablet, Take 1 tablet by mouth daily., Disp: , Rfl:  .  OVER THE COUNTER MEDICATION, Hawthorne, Disp: , Rfl:  .  OVER THE COUNTER MEDICATION, Hibiscus, Disp: , Rfl:  .  OVER THE COUNTER MEDICATION, Turmeric , Disp: , Rfl:   EXAM:  Vitals:   10/28/17 0928  BP: 120/82  Pulse: 84  Temp: 98.4 F (36.9 C)  SpO2: 98%    Body mass index is 35.54 kg/m.  GENERAL: vitals reviewed and listed above, alert, oriented, appears well hydrated and in no acute distress  HEENT: atraumatic, conjunttiva clear, no obvious abnormalities on inspection of external nose and ears  NECK: bilat ant cervical and R post cervical shotty, nontender, mobile, rubbery LAD  LUNGS: clear to auscultation bilaterally, no wheezes, rales or rhonchi, good air movement  CV: HRRR, no peripheral edema  MS: moves all extremities without noticeable abnormality  PSYCH: pleasant and cooperative, no obvious depression or anxiety  ASSESSMENT AND PLAN:  Discussed the following assessment and plan:  Lymphadenopathy - Plan: Basic metabolic panel, CBC  Essential hypertension  Systemic lupus erythematosus, unspecified SLE type, unspecified organ involvement status (HCC)  BMI 35.0-35.9,adult  -advised must follow up with ENT regarding the lymphadenopathy, she agrees to call -labs per orders -discussed  BP, she prefers to restart supplement and declined BP medications -CPE in 3-4 months -follow up sooner as needed  Patient Instructions  BEFORE YOU LEAVE: -labs -follow up: CPE in 3-4 months and as needed  Call your Ear, nose and throat specialist today for follow up regarding the lymph nodes.  We have ordered labs or studies at this visit. It can take up to 1-2 weeks for results and processing. IF  results require follow up or explanation, we will call you with instructions. Clinically stable results will be released to your [facility]. If you have not heard from [place] or cannot find your results in [facility] in 2 weeks please contact our office at [phone].  If you are not yet signed up for [facility], please consider signing up.           Terressa Koyanagi, DO

## 2017-10-28 NOTE — Patient Instructions (Addendum)
BEFORE YOU LEAVE: -labs -follow up: CPE in 3-4 months and as needed  Call your Ear, nose and throat specialist today for follow up regarding the lymph nodes.  We have ordered labs or studies at this visit. It can take up to 1-2 weeks for results and processing. IF results require follow up or explanation, we will call you with instructions. Clinically stable results will be released to your [facility]. If you have not heard from [place] or cannot find your results in [facility] in 2 weeks please contact our office at [phone].  If you are not yet signed up for [facility], please consider signing up.

## 2017-11-07 ENCOUNTER — Ambulatory Visit
Admission: RE | Admit: 2017-11-07 | Discharge: 2017-11-07 | Disposition: A | Discharge status: Home / Self Care | Payer: 59 | Source: Ambulatory Visit | Attending: Otolaryngology | Admitting: Otolaryngology

## 2017-11-07 DIAGNOSIS — R590 Localized enlarged lymph nodes: Secondary | ICD-10-CM

## 2017-12-23 ENCOUNTER — Encounter: Payer: Self-pay | Admitting: Family Medicine

## 2017-12-24 ENCOUNTER — Encounter: Payer: Self-pay | Admitting: Family Medicine

## 2017-12-24 ENCOUNTER — Ambulatory Visit (INDEPENDENT_AMBULATORY_CARE_PROVIDER_SITE_OTHER): Payer: 59 | Admitting: Family Medicine

## 2017-12-24 VITALS — BP 130/90 | HR 90 | Temp 99.2°F | Wt 184.8 lb

## 2017-12-24 DIAGNOSIS — R51 Headache: Secondary | ICD-10-CM | POA: Diagnosis not present

## 2017-12-24 DIAGNOSIS — R519 Headache, unspecified: Secondary | ICD-10-CM

## 2017-12-24 MED ORDER — CYCLOBENZAPRINE HCL 5 MG PO TABS: ORAL_TABLET | ORAL | 1 refills | Status: DC

## 2017-12-24 NOTE — Patient Instructions (Signed)
General Headache Without Cause A headache is pain or discomfort felt around the head or neck area. The specific cause of a headache may not be found. There are many causes and types of headaches. A few common ones are:  Tension headaches.  Migraine headaches.  Cluster headaches.  Chronic daily headaches.  Follow these instructions at home: Watch your condition for any changes. Take these steps to help with your condition: Managing pain  Take over-the-counter and prescription medicines only as told by your health care provider.  Lie down in a dark, quiet room when you have a headache.  If directed, apply ice to the head and neck area: ? Put ice in a plastic bag. ? Place a towel between your skin and the bag. ? Leave the ice on for 20 minutes, 2-3 times per day.  Use a heating pad or hot shower to apply heat to the head and neck area as told by your health care provider.  Keep lights dim if bright lights bother you or make your headaches worse. Eating and drinking  Eat meals on a regular schedule.  Limit alcohol use.  Decrease the amount of caffeine you drink, or stop drinking caffeine. General instructions  Keep all follow-up visits as told by your health care provider. This is important.  Keep a headache journal to help find out what may trigger your headaches. For example, write down: ? What you eat and drink. ? How much sleep you get. ? Any change to your diet or medicines.  Try massage or other relaxation techniques.  Limit stress.  Sit up straight, and do not tense your muscles.  Do not use tobacco products, including cigarettes, chewing tobacco, or e-cigarettes. If you need help quitting, ask your health care provider.  Exercise regularly as told by your health care provider.  Sleep on a regular schedule. Get 7-9 hours of sleep, or the amount recommended by your health care provider. Contact a health care provider if:  Your symptoms are not helped by  medicine.  You have a headache that is different from the usual headache.  You have nausea or you vomit.  You have a fever. Get help right away if:  Your headache becomes severe.  You have repeated vomiting.  You have a stiff neck.  You have a loss of vision.  You have problems with speech.  You have pain in the eye or ear.  You have muscular weakness or loss of muscle control.  You lose your balance or have trouble walking.  You feel faint or pass out.  You have confusion. This information is not intended to replace advice given to you by your health care provider. Make sure you discuss any questions you have with your health care provider. Document Released: 06/17/2005 Document Revised: 11/23/2015 Document Reviewed: 10/10/2014 Elsevier Interactive Patient Education  2018 [employer]  

## 2017-12-24 NOTE — Progress Notes (Signed)
  Subjective:     Patient ID: Sandra Vang, female   DOB: 12/07/1975, 42 y.o.   MRN: [id number]  HPI Patient seen with headache. Onset about 3 days ago. She describes a dull headache occipital area bilaterally. She's had some mild body aches. Mild nasal congestion today. No sore throat. No fever. No recent tick bites.  She has some question of remote history of migraine headaches. She does have history of fibromyalgia. She's had some mild light sensitivity with this headache and mild nausea but no vomiting. Took Ibuprofen with some relief. She is aware of some muscle stiffness in her neck especially when she wakes up in the morning.  Past Medical History:  Diagnosis Date  . Chronic headaches   . Depression   . Fibromyalgia   . Hypertension   . Insomnia   . MRSA (methicillin resistant Staphylococcus aureus) infection   . OSA (obstructive sleep apnea) 03/24/2015  . Systemic lupus erythematosus (HCC) 10/01/2016   -seeing dermatology and rheumatologist (Dr. Kathi LudwigSyed)   Past Surgical History:  Procedure Laterality Date  . APPENDECTOMY  2005  . CESAREAN SECTION    . TUBAL LIGATION  1998    reports that she has never smoked. She has never used smokeless tobacco. She reports that she does not drink alcohol or use drugs. family history includes Alcohol abuse in her maternal grandfather, maternal grandmother, and mother; Arthritis in her maternal grandmother and paternal grandmother; Asthma in her brother, daughter, daughter, and paternal grandmother; Cancer in her maternal grandmother and paternal grandmother; Depression in her paternal aunt and paternal uncle; Drug abuse in her father; Emphysema in her father; Hypertension in her father, maternal grandfather, maternal grandmother, mother, paternal grandfather, and paternal grandmother. Allergies  Allergen Reactions  . Dexamethasone     Severe vaginal and rectal burning  . Iohexol      Desc: HIVES   . Ivp Dye [Iodinated Diagnostic Agents]   .  Sulfa Antibiotics Hives     Review of Systems  Constitutional: Negative for chills and fever.  Respiratory: Negative for shortness of breath.   Cardiovascular: Negative for chest pain.  Skin: Negative for rash.  Neurological: Positive for headaches. Negative for dizziness, seizures and syncope.  Psychiatric/Behavioral: Negative for confusion.       Objective:   Physical Exam  Constitutional: She is oriented to person, place, and time. She appears well-developed and well-nourished.  HENT:  Head: Normocephalic and atraumatic.  Mouth/Throat: Oropharynx is clear and moist.  Neck: Neck supple.  One small rubbery mobile node left posterior cervical triangle  Cardiovascular: Normal rate.  Pulmonary/Chest: Effort normal and breath sounds normal.  Neurological: She is alert and oriented to person, place, and time. She has normal strength. No cranial nerve deficit. Coordination normal.       Assessment:     Bilateral occipital headache. Suspect largely tension-type. History of fibromyalgia.    Plan:     -Recommend conservative measures with plenty of rest, muscle massage, topical heat and trial of low-dose Flexeril 5 mg daily at bedtime -Touch base in a couple days if not improving  Kristian CoveyBruce W Cliford Sequeira MD [facility]

## 2017-12-25 ENCOUNTER — Encounter: Payer: Self-pay | Admitting: Family Medicine

## 2017-12-25 NOTE — Telephone Encounter (Signed)
Please let her know that Dr. Caryl NeverBurchette is out of the office today. I am sorry that she was not feeling well. Please let her know that temperature can change from the foods she eats, ambient temperature, time of the day, time of the month and from many other factors. Also, different thermometers with give different readings. A fever is a temperature over 100.4.  If she is feeling worse, not improving or is having fevers or other concerns recommend a follow up visit. I hope she is feeling better soon.

## 2018-01-06 ENCOUNTER — Ambulatory Visit: Payer: 59 | Admitting: Family Medicine

## 2018-01-06 DIAGNOSIS — Z0289 Encounter for other administrative examinations: Secondary | ICD-10-CM

## 2018-03-30 ENCOUNTER — Other Ambulatory Visit: Payer: Self-pay

## 2018-03-30 ENCOUNTER — Encounter: Payer: Self-pay | Admitting: Family Medicine

## 2018-03-30 ENCOUNTER — Ambulatory Visit (INDEPENDENT_AMBULATORY_CARE_PROVIDER_SITE_OTHER): Payer: 59 | Admitting: Family Medicine

## 2018-03-30 VITALS — BP 124/84 | HR 81 | Temp 98.9°F | Ht 61.0 in | Wt 189.8 lb

## 2018-03-30 DIAGNOSIS — R0789 Other chest pain: Secondary | ICD-10-CM | POA: Diagnosis not present

## 2018-03-30 DIAGNOSIS — R05 Cough: Secondary | ICD-10-CM

## 2018-03-30 DIAGNOSIS — R059 Cough, unspecified: Secondary | ICD-10-CM

## 2018-03-30 MED ORDER — DICLOFENAC SODIUM 75 MG PO TBEC: 75.0000 mg | DELAYED_RELEASE_TABLET | Freq: Two times a day (BID) | ORAL | 0 refills | Status: DC | PRN

## 2018-03-30 NOTE — Patient Instructions (Signed)
Follow up for any fever or increased shortness of breath.  Be sure to take the Diclofenac with food.

## 2018-03-30 NOTE — Progress Notes (Signed)
Subjective:     Patient ID: Sandra Vang, female   DOB: 29-Mar-1976, 42 y.o.   MRN: [id number]  HPI Patient is non-smoker who was seen with some mild chest wall pain with inhalation past few days.  She states couple weeks ago she developed typical cold-like symptoms.  She describes a dull achy pain which is relatively continuous mostly left chest wall and worse with deep breathing.  She has had some cough which is mostly dry.  Pain is not positional.  No radiation into the neck or arm.  No dyspnea.  Denies any leg pain or any asymmetric leg swelling.  No history of DVT.  She has also had some chest wall tenderness.  She tried some tumeric without improvement.  Recent blood pressures have been somewhat fluctuating.  She had reading of 160/119 on home machine but then took this later and got reading which was much lower around 120/70 range. She has never smoked.  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 Chronic headaches   . Depression   . Fibromyalgia   . Hypertension   . Insomnia   . MRSA (methicillin resistant Staphylococcus aureus) infection   . OSA (obstructive sleep apnea) 03/24/2015  . Systemic lupus erythematosus (HCC) 10/01/2016   -seeing dermatology and rheumatologist (Dr. Kathi Ludwig)   Past Surgical History:  Procedure Laterality Date  . APPENDECTOMY  2005  . CESAREAN SECTION    . TUBAL LIGATION  1998    reports that she has never smoked. She has never used smokeless tobacco. She reports that she does not drink alcohol or use drugs. family history includes Alcohol abuse in her maternal grandfather, maternal grandmother, and mother; Arthritis in her maternal grandmother and paternal grandmother; Asthma in her brother, daughter, daughter, and paternal grandmother; Cancer in her maternal grandmother and paternal grandmother; Depression in her paternal aunt and paternal uncle; Drug abuse in her father; Emphysema in  her father; Hypertension in her father, maternal grandfather, maternal grandmother, mother, paternal grandfather, and paternal grandmother. Allergies  Allergen Reactions  . Iodinated Diagnostic Agents Anaphylaxis  . Dexamethasone     Severe vaginal and rectal burning  . Iohexol      Desc: HIVES   . Sulfa Antibiotics Hives  . Sulfasalazine Hives     Review of Systems  Constitutional: Negative for chills and fever.  HENT: Negative for trouble swallowing.   Respiratory: Positive for cough. Negative for shortness of breath and wheezing.   Cardiovascular: Negative for palpitations and leg swelling.  Gastrointestinal: Negative for abdominal pain.  Genitourinary: Negative for dysuria.  Neurological: Negative for weakness.       Objective:   Physical Exam  Constitutional: She appears well-developed and well-nourished.  Cardiovascular: Normal rate and regular rhythm. Exam reveals no gallop and no friction rub.  No murmur heard. Pulmonary/Chest: Effort normal and breath sounds normal. She has no decreased breath sounds. She has no wheezes. She has no rales.  Musculoskeletal:       Right lower leg: She exhibits no edema.       Left lower leg: She exhibits no edema.       Assessment:     Patient presents with some chest wall discomfort for the past several days following recent viral URI.  Nonfocal exam.  Suspect musculoskeletal.  She does not have any red flags such as hypoxia, tachypnea, fever, hemoptysis    Plan:     -She has  taken diclofenac in the past without difficulty and we wrote for diclofenac 75 mg twice daily with food for the next few days -Follow-up promptly for any shortness of breath or fever  Kristian Covey MD [facility] at [facility]

## 2018-07-14 ENCOUNTER — Encounter: Payer: Self-pay | Admitting: Family Medicine

## 2018-07-14 ENCOUNTER — Encounter: Payer: Self-pay | Admitting: *Deleted

## 2018-07-14 ENCOUNTER — Ambulatory Visit (INDEPENDENT_AMBULATORY_CARE_PROVIDER_SITE_OTHER): Payer: 59 | Admitting: Family Medicine

## 2018-07-14 VITALS — BP 130/80 | HR 78 | Temp 99.1°F | Resp 12 | Ht 62.0 in | Wt 182.1 lb

## 2018-07-14 DIAGNOSIS — R51 Headache: Secondary | ICD-10-CM

## 2018-07-14 DIAGNOSIS — K529 Noninfective gastroenteritis and colitis, unspecified: Secondary | ICD-10-CM | POA: Diagnosis not present

## 2018-07-14 DIAGNOSIS — R519 Headache, unspecified: Secondary | ICD-10-CM

## 2018-07-14 DIAGNOSIS — J069 Acute upper respiratory infection, unspecified: Secondary | ICD-10-CM

## 2018-07-14 DIAGNOSIS — J029 Acute pharyngitis, unspecified: Secondary | ICD-10-CM

## 2018-07-14 LAB — POCT RAPID STREP A (OFFICE): Rapid Strep A Screen: NEGATIVE

## 2018-07-14 MED ORDER — MAGIC MOUTHWASH W/LIDOCAINE: 5.0000 mL | Freq: Three times a day (TID) | ORAL | 0 refills | Status: AC | PRN

## 2018-07-14 MED ORDER — TRAMADOL HCL 50 MG PO TABS: 50.0000 mg | ORAL_TABLET | Freq: Two times a day (BID) | ORAL | 0 refills | Status: AC | PRN

## 2018-07-14 NOTE — Patient Instructions (Addendum)
A few things to remember from today's visit:   URI, acute  Gastroenteritis, acute  Acute pharyngitis, unspecified etiology - Plan: magic mouthwash w/lidocaine SOLN   Symptomatic treatment: Over the counter Acetaminophen 500 mg and/or Ibuprofen (400-600 mg) if there is not contraindications; you can alternate in between both every 4-6 hours. Gargles with saline water and throat lozenges might also help. Cold fluids.    Seek prompt medical evaluation if you are having difficulty breathing, mouth swelling, throat closing up, not able to swallow liquids (drooling), skin rash/bruising, or worsening symptoms.  Please follow up in 2 weeks if not any better.    Please be sure medication list is accurate. If a new problem present, please set up appointment sooner than planned today.

## 2018-07-14 NOTE — Progress Notes (Signed)
ACUTE VISIT  HPI:  Chief Complaint  Patient presents with  . N/V/D    on Saturday  . Headache    sx started Saturday morning  . Sore Throat  . Dry Cough    Sandra Vang is a 43 y.o.female with history of fibromyalgia, hypertension, and systemic lupus is here today complaining of 2 to 3 days of sore throat and headache. 3 days ago she had body aches, nausea, vomiting, and diarrhea.  All these symptoms have resolved as well as abdominal cramps. Last time she had abdominal pain was yesterday.   Sore throat, 5/10, exacerbated by swallowing and prolonged talking. She denies dysphasia or stridor. History of hypertrophic tonsils, she thinks she has some whitish spot on left tonsil.  Nasal congestion, rhinorrhea, and postnasal drainage.   Sore Throat   This is a new problem. The current episode started in the past 7 days. The problem has been unchanged. There has been no fever. The pain is at a severity of 5/10. Associated symptoms include congestion, coughing, diarrhea, headaches and vomiting. Pertinent negatives include no ear pain, hoarse voice, plugged ear sensation, neck pain, shortness of breath, stridor, swollen glands or trouble swallowing. She has had no exposure to strep or mono. She has tried NSAIDs and acetaminophen for the symptoms. The treatment provided mild relief.   She is concerned about headache, she is requesting something stronger for pain.  " Debilitating" generalized headache, heavy feeling. No associated visual changes, nausea, vomiting, or focal deficit. She has history of migraines but this headache is different. She has taking ibuprofen and acetaminophen, which have not helped much.  No Hx of recent travel. She was taking care of her grandchild, who was sick with GI and respiratory symptoms, states that he vomited on her. No known insect bite.   Review of Systems  Constitutional: Positive for activity change, appetite change and fatigue.  Negative for fever.  HENT: Positive for congestion, postnasal drip, rhinorrhea and sore throat. Negative for ear pain, hoarse voice, mouth sores, sinus pressure, trouble swallowing and voice change.   Eyes: Negative for discharge, redness and itching.  Respiratory: Positive for cough. Negative for shortness of breath, wheezing and stridor.   Gastrointestinal: Positive for diarrhea, nausea and vomiting. Negative for blood in stool.  Musculoskeletal: Positive for myalgias. Negative for gait problem and neck pain.  Skin: Negative for rash.  Allergic/Immunologic: Positive for environmental allergies.  Neurological: Positive for light-headedness and headaches. Negative for syncope, facial asymmetry, weakness and numbness.  Hematological: Negative for adenopathy. Does not bruise/bleed easily.      Current Outpatient Medications on File Prior to Visit  Medication Sig Dispense Refill  . cyclobenzaprine (FLEXERIL) 5 MG tablet Take one to two at night as needed for headache. (Patient not taking: Reported on 07/14/2018) 30 tablet 1  . hydrochlorothiazide (HYDRODIURIL) 25 MG tablet TAKE 1 TABLET(25 MG) BY MOUTH DAILY (Patient not taking: No sig reported) 90 tablet 1  . MAGNESIUM PO Take by mouth daily.    . Multiple Vitamin (MULTIVITAMIN) tablet Take 1 tablet by mouth daily.    [name]. OVER THE COUNTER MEDICATION Hawthorne    . OVER THE COUNTER MEDICATION Hibiscus    . OVER THE COUNTER MEDICATION Turmeric 1500mg      No current facility-administered medications on file prior to visit.      Past Medical History:  Diagnosis Date  . Chronic headaches   . Depression   . Fibromyalgia   . Hypertension   .  Insomnia   . MRSA (methicillin resistant Staphylococcus aureus) infection   . OSA (obstructive sleep apnea) 03/24/2015  . Systemic lupus erythematosus (HCC) 10/01/2016   -seeing dermatology and rheumatologist (Dr. Kathi LudwigSyed)   Allergies  Allergen Reactions  . Iodinated Diagnostic Agents Anaphylaxis  .  Dexamethasone     Severe vaginal and rectal burning  . Iohexol      Desc: HIVES   . Sulfa Antibiotics Hives  . Sulfasalazine Hives    Social History   Socioeconomic History  . Marital status: Legally Separated    Spouse name: Not on file  . Number of children: Not on file  . Years of education: Not on file  . Highest education level: Not on file  Occupational History  . Occupation: Charity fundraiser[employer]  Social Needs  . Financial resource strain: Not on file  . Food insecurity:    Worry: Not on file    Inability: Not on file  . Transportation needs:    Medical: Not on file    Non-medical: Not on file  Tobacco Use  . Smoking status: Never Smoker  . Smokeless tobacco: Never Used  Substance and Sexual Activity  . Alcohol use: No    Alcohol/week: 0.0 standard drinks  . Drug use: No  . Sexual activity: Not on file  Lifestyle  . Physical activity:    Days per week: Not on file    Minutes per session: Not on file  . Stress: Not on file  Relationships  . Social connections:    Talks on phone: Not on file    Gets together: Not on file    Attends religious service: Not on file    Active member of club or organization: Not on file    Attends meetings of clubs or organizations: Not on file    Relationship status: Not on file  Other Topics Concern  . 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   Physical Exam  Nursing note and vitals reviewed. Constitutional: She is oriented to person, place, and time. She appears well-developed. She does not appear ill. No distress.  HENT:  Head: Atraumatic.  Mouth/Throat: Uvula is midline and mucous membranes  are normal. Posterior oropharyngeal erythema present. No oropharyngeal exudate, posterior oropharyngeal edema or tonsillar abscesses.  Hypertrophic tonsils. His scalp tenderness with palpation, frontoparietal and temporal.  Eyes: Pupils are equal, round, and reactive to light. Conjunctivae are normal.  Neck: Neck supple. No muscular tenderness present. No edema and no erythema present. No Brudzinski's sign and no Kernig's sign noted.  Cardiovascular: Normal rate and regular rhythm.  No murmur heard. Respiratory: Effort normal and breath sounds normal. No stridor. No respiratory distress.  GI: Soft. She exhibits no mass. There is no hepatomegaly. There is no abdominal tenderness.  Lymphadenopathy:       Head (right side): No submandibular adenopathy present.       Head (left side): No submandibular adenopathy present.    She has cervical adenopathy.       Right cervical: Posterior  cervical adenopathy present.       Left cervical: Posterior cervical adenopathy present.  Neurological: She is alert and oriented to person, place, and time. She has normal strength. No cranial nerve deficit. Gait normal.  Reflex Scores:      Patellar reflexes are 2+ on the right side and 2+ on the left side. Skin: Skin is warm. No rash noted. No erythema.  Psychiatric: Her mood appears anxious.  Well groomed, good eye contact.    ASSESSMENT AND PLAN:   Ms. Tisa was seen today for n/v/d, headache, sore throat and dry cough.  Diagnoses and all orders for this visit:  URI, acute Explained that symptoms suggest a viral infection, so symptomatic treatment is recommended at this time. Plenty of fluids and rest.  -     Culture, Group A Strep -     POC Rapid Strep A  Gastroenteritis, acute GI symptoms have resolved. She refused rapid flu test here in the office.  Acute pharyngitis, unspecified etiology Symptomatic treatment with gargling Magic mouthwash with lidocaine recommended. She can continue  acetaminophen and ibuprofen as needed. Rapid strep here in the office negative, will follow culture. Instructed about warning signs.  -     magic mouthwash w/lidocaine SOLN; Take 5 mLs by mouth 3 (three) times daily as needed for up to 10 days for mouth pain. 50 ml of diphenhydramine, alum and mag hydroxide, and lidocaine to make 150 ml -     Culture, Group A Strep -     POC Rapid Strep A  Headache, unspecified headache type We discussed possible etiologies, most likely related to above symptoms. Examination today does not suggest a serious process, so I do not think head imaging is needed at this time. Tramadol side effect discussed. She was clearly instructed about warning signs. Follow-up with PCP as needed.  -     traMADol (ULTRAM) 50 MG tablet; Take 1 tablet (50 mg total) by mouth every 12 (twelve) hours as needed for up to 3 days.   Return if symptoms worsen or fail to improve.     Suren Payne G. [place], MD  [facility]. [facility].

## 2018-07-16 LAB — CULTURE, GROUP A STREP
MICRO NUMBER:: 53033
SPECIMEN QUALITY: ADEQUATE

## 2018-07-20 ENCOUNTER — Telehealth: Payer: Self-pay | Admitting: Family Medicine

## 2018-07-20 ENCOUNTER — Other Ambulatory Visit: Payer: Self-pay | Admitting: Family Medicine

## 2018-07-20 DIAGNOSIS — J020 Streptococcal pharyngitis: Secondary | ICD-10-CM

## 2018-07-20 MED ORDER — AMOXICILLIN 500 MG PO CAPS: 500.0000 mg | ORAL_CAPSULE | Freq: Two times a day (BID) | ORAL | 0 refills | Status: AC

## 2018-07-20 NOTE — Telephone Encounter (Signed)
Copied from CRM [id number]. Topic: Quick Communication - See Telephone Encounter >> Jul 20, 2018  9:00 AM Jens Som A wrote: CRM for notification. See Telephone encounter for: 07/20/18.  Patient is calling because the culture for her strept was positive and the Swap was negative.  Patient would like to know what the next step is or what do for treatment? Please advise. [phone]

## 2018-07-20 NOTE — Telephone Encounter (Signed)
Spoke with patient and gave directions to take Amoxicillin twice a day for 10 days per Dr. [place]. Patient verbalized understanding.

## 2018-08-31 ENCOUNTER — Ambulatory Visit: Payer: 59 | Admitting: Family Medicine

## 2018-09-01 ENCOUNTER — Ambulatory Visit: Payer: 59 | Admitting: Family Medicine

## 2018-09-02 NOTE — Progress Notes (Deleted)
  HPI:  Using dictation device. Unfortunately this device frequently misinterprets words/phrases.  Sandra Vang is a pleasant 43 yo here for follow up. Has a PMG significant for Lupus (sees rheumatology and dermatology for management), depression, fibromyalgia, chronic headaches, hypertension and obesity. Due for labs, flu vaccine, pap Sees Dr. Cherly Hensen, gyn for pap smears.  ROS: See pertinent positives and negatives per HPI.  Past Medical History:  Diagnosis Date  . Chronic headaches   . Depression   . Fibromyalgia   . Hypertension   . Insomnia   . MRSA (methicillin resistant Staphylococcus aureus) infection   . OSA (obstructive sleep apnea) 03/24/2015  . Systemic lupus erythematosus (HCC) 10/01/2016   -seeing dermatology and rheumatologist (Dr. Kathi Ludwig)    Past Surgical History:  Procedure Laterality Date  . APPENDECTOMY  2005  . CESAREAN SECTION    . TUBAL LIGATION  1998    Family History  Problem Relation Age of Onset  . Alcohol abuse Mother   . Hypertension Mother   . Drug abuse Father   . Hypertension Father   . Emphysema Father   . Depression Paternal Aunt   . Depression Paternal Uncle   . Alcohol abuse Maternal Grandmother   . Arthritis Maternal Grandmother   . Cancer Maternal Grandmother   . Hypertension Maternal Grandmother   . Alcohol abuse Maternal Grandfather   . Hypertension Maternal Grandfather   . Arthritis Paternal Grandmother   . Cancer Paternal Grandmother   . Hypertension Paternal Grandmother   . Asthma Paternal Grandmother   . Hypertension Paternal Grandfather   . Asthma Brother   . Asthma Daughter   . Asthma Daughter     SOCIAL HX: ***   Current Outpatient Medications:  .  cyclobenzaprine (FLEXERIL) 5 MG tablet, Take one to two at night as needed for headache. (Patient not taking: Reported on 07/14/2018), Disp: 30 tablet, Rfl: 1 .  hydrochlorothiazide (HYDRODIURIL) 25 MG tablet, TAKE 1 TABLET(25 MG) BY MOUTH DAILY (Patient not taking: No  sig reported), Disp: 90 tablet, Rfl: 1 .  MAGNESIUM PO, Take by mouth daily., Disp: , Rfl:  .  Multiple Vitamin (MULTIVITAMIN) tablet, Take 1 tablet by mouth daily., Disp: , Rfl:  .  OVER THE COUNTER MEDICATION, Hawthorne, Disp: , Rfl:  .  OVER THE COUNTER MEDICATION, Hibiscus, Disp: , Rfl:  .  OVER THE COUNTER MEDICATION, Turmeric 1500mg , Disp: , Rfl:   EXAM:  There were no vitals filed for this visit.  There is no height or weight on file to calculate BMI.  GENERAL: vitals reviewed and listed above, alert, oriented, appears well hydrated and in no acute distress  HEENT: atraumatic, conjunttiva clear, no obvious abnormalities on inspection of external nose and ears  NECK: no obvious masses on inspection  LUNGS: clear to auscultation bilaterally, no wheezes, rales or rhonchi, good air movement  CV: HRRR, no peripheral edema  MS: moves all extremities without noticeable abnormality *** PSYCH: pleasant and cooperative, no obvious depression or anxiety  ASSESSMENT AND PLAN:  Discussed the following assessment and plan:  No diagnosis found.  *** -Patient advised to return or notify a doctor immediately if symptoms worsen or persist or new concerns arise.  There are no Patient Instructions on file for this visit.  Terressa Koyanagi, DO

## 2018-09-03 ENCOUNTER — Ambulatory Visit: Payer: 59 | Admitting: Family Medicine

## 2018-09-14 DIAGNOSIS — M5430 Sciatica, unspecified side: Secondary | ICD-10-CM | POA: Insufficient documentation

## 2018-10-21 ENCOUNTER — Ambulatory Visit: Payer: Self-pay | Admitting: Family Medicine

## 2018-10-21 ENCOUNTER — Ambulatory Visit (INDEPENDENT_AMBULATORY_CARE_PROVIDER_SITE_OTHER): Payer: 59 | Admitting: Family Medicine

## 2018-10-21 ENCOUNTER — Other Ambulatory Visit: Payer: Self-pay

## 2018-10-21 ENCOUNTER — Encounter: Payer: Self-pay | Admitting: Family Medicine

## 2018-10-21 VITALS — Resp 12

## 2018-10-21 DIAGNOSIS — M329 Systemic lupus erythematosus, unspecified: Secondary | ICD-10-CM

## 2018-10-21 DIAGNOSIS — R509 Fever, unspecified: Secondary | ICD-10-CM

## 2018-10-21 NOTE — Telephone Encounter (Signed)
  Pt called in c/o having a fever since April 8.  It's not been any higher than 100.   She has no other symptoms other than feeling like she is congested in her chest but she is not per a chest x ray that was done at the [facility] urgent care.   She has been on a 14 day quarantine due to her fever from work.   She is a Engineer, civil (consulting).    I warm transferred her call to Dr. Elmyra Ricks office to [place] to schedule for a virtual visit.  I sent my notes to the office also.    Reason for Disposition . Fever present > 3 days (72 hours)  Answer Assessment - Initial Assessment Questions 1. TEMPERATURE: "What is the most recent temperature?"  "How was it measured?"      I'm having a fever since April 8th.   I don't know what my normal temp is maybe 98.   It's check it multiple times a day orally.  100 is the highest it's been.    I went to [facility] urgent care because I thought I had chest congestion but I don't.   My chest x-ray is clear. I've had chest wall pain so many times.  And been to the hospital so many times.    I'm been staying home.   I called [facility] prior to going to [place].   I had a sore throat but it's gone now.    I've had pneumonia before which is why I went to [facility]. 2. ONSET: "When did the fever start?"      April 8    I don't have a cough or shortness of breath.  I've been at home since April 8th.    I'm working from home.   3. SYMPTOMS: "Do you have any other symptoms besides the fever?"  (e.g., colds, headache, sore throat, earache, cough, rash, diarrhea, vomiting, abdominal pain)     I don't have any symptoms except I feel like I'm congested in my chest but I'm not.   I'm a Counselling psychologist.    I started having the symptoms after I went to the grocery store Friday April 3rd.    4. CAUSE: If there are no symptoms, ask: "What do you think is causing the fever?"      I really don't know. 5. CONTACTS: "Does anyone else in the family have an infection?"     No 6. TREATMENT:  "What have you done so far to treat this fever?" (e.g., medications)     Nothing just monitoring it.    7. IMMUNOCOMPROMISE: "Do you have of the following: diabetes, HIV positive, splenectomy, cancer chemotherapy, chronic steroid treatment, transplant patient, etc."     I have tenderness over both sides of my ribcage when I press on it.    8. PREGNANCY: "Is there any chance you are pregnant?" "When was your last menstrual period?"     no 9. TRAVEL: "Have you traveled out of the country in the last month?" (e.g., travel history, exposures)     No travels or exposures.  Protocols used: FEVER-A-AH

## 2018-10-21 NOTE — Assessment & Plan Note (Signed)
This problem could explain the fever. She is not having skin rash,urinary symptoms, or arthralgias. She is not interested in short course of prednisone.

## 2018-10-21 NOTE — Progress Notes (Signed)
Virtual Visit via Video Note   I connected with Ms [name] on 10/21/18 at  3:30 PM EDT by a video enabled telemedicine application and verified that I am speaking with the correct person using two identifiers.  Location patient: home Location provider:home office Persons participating in the virtual visit: patient, provider  I discussed the limitations of evaluation and management by telemedicine and the availability of in person appointments. She expressed understanding and agreed to proceed.   HPI: Ms Sandra Vang is a 43 yo female with Hx of discoid lupus,fibromyalgia,polyarthralgia,and HTN;who is concerned about 2-3 weeks of intermittent fever. 98.8,99.4,and 100 F. Last time she had 100 F temp was 2 days ago.  She was evaluated in a acute care facility on 10/07/2018 because of a few days of respiratory symptoms. Sore throat, cough, and chest congestion. According to patient she had a negative chest x-ray.  She was not unusually fatigue, having chills or fever.  According the patient, temperature was 100.0 F during visit, since then she has been checking temperature daily. She is concerned because she could been having fever for longer without knowing because she was not checking temp.   Symptoms have resolved. Negative for runny nose, nasal congestion, facial pain, ear ache, or enlarged glands.  She states that she does "not feel bad."  Chest discomfort, she cannot describe pain, similar to chest wall pain she had in the past when she had a "flareup." 2 weeks before acute care visit she has severe shoulder pain and thumbs (joint erythema), no erythema.  Joint pain has resolved.  She denies sick contact or recent travel.  According to patient, she has been diagnosed with psoriatic arthritis, lupus, and fibromyalgia. She reports having negative ANA. Last time she followed with rheumatology was 07/2017.  She denies cough, dyspnea, diaphoresis, palpitations, abdominal pain, nausea,  vomiting, changes in bowel habits, urinary symptoms, skin rash, numbness, tingling, unusual headache, or focal deficit. She denies decreased urine output or gross hematuria.   ROS: See pertinent positives and negatives per HPI. COVID-19 screening questions: She is not aware of possible exposure to COVID-19. Negative for loss in the sense of smell or taste.   Past Medical History:  Diagnosis Date  . Chronic headaches   . Depression   . Fibromyalgia   . Hypertension   . Insomnia   . MRSA (methicillin resistant Staphylococcus aureus) infection   . OSA (obstructive sleep apnea) 03/24/2015  . Systemic lupus erythematosus (HCC) 10/01/2016   -seeing dermatology and rheumatologist (Dr. Kathi Ludwig)    Past Surgical History:  Procedure Laterality Date  . APPENDECTOMY  2005  . CESAREAN SECTION    . TUBAL LIGATION  1998    Family History  Problem Relation Age of Onset  . Alcohol abuse Mother   . Hypertension Mother   . Drug abuse Father   . Hypertension Father   . Emphysema Father   . Depression Paternal Aunt   . Depression Paternal Uncle   . Alcohol abuse Maternal Grandmother   . Arthritis Maternal Grandmother   . Cancer Maternal Grandmother   . Hypertension Maternal Grandmother   . Alcohol abuse Maternal Grandfather   . Hypertension Maternal Grandfather   . Arthritis Paternal Grandmother   . Cancer Paternal Grandmother   . Hypertension Paternal Grandmother   . Asthma Paternal Grandmother   . Hypertension Paternal Grandfather   . Asthma Brother   . Asthma Daughter   . Asthma Daughter     Social History   Socioeconomic History  .  Marital status: Legally Separated    Spouse name: Not on file  . Number of children: Not on file  . Years of education: Not on file  . Highest education level: Not on file  Occupational History  . Occupation: Charity fundraiserN  Social Needs  . Financial resource strain: Not on file  . Food insecurity:    Worry: Not on file    Inability: Not on file  .  Transportation needs:    Medical: Not on file    Non-medical: Not on file  Tobacco Use  . Smoking status: Never Smoker  . Smokeless tobacco: Never Used  Substance and Sexual Activity  . Alcohol use: No    Alcohol/week: 0.0 standard drinks  . Drug use: No  . Sexual activity: Not on file  Lifestyle  . Physical activity:    Days per week: Not on file    Minutes per session: Not on file  . Stress: Not on file  Relationships  . Social connections:    Talks on phone: Not on file    Gets together: Not on file    Attends religious service: Not on file    Active member of club or organization: Not on file    Attends meetings of clubs or organizations: Not on file    Relationship status: Not on file  . Intimate partner violence:    Fear of current or ex partner: Not on file    Emotionally abused: Not on file    Physically abused: Not on file    Forced sexual activity: Not on file  Other Topics Concern  . 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  cyclobenzaprine (FLEXERIL) 5 MG tablet, Take one to two at night as needed for headache. (Patient not taking: Reported on 07/14/2018), Disp: 30 tablet, Rfl: 1 .  hydrochlorothiazide (HYDRODIURIL) 25 MG tablet, TAKE 1 TABLET(25 MG) BY MOUTH DAILY (Patient not taking: No sig reported), Disp: 90 tablet, Rfl: 1 .  MAGNESIUM PO, Take by mouth daily., Disp: , Rfl:  .  Multiple Vitamin (MULTIVITAMIN) tablet, Take 1 tablet by mouth daily., Disp: , Rfl:  .  OVER THE COUNTER MEDICATION, Hawthorne, Disp: , Rfl:  .  OVER THE COUNTER MEDICATION, Hibiscus, Disp: , Rfl:  .  OVER THE COUNTER MEDICATION, Turmeric 1500mg , Disp: , Rfl:   EXAM:  VITALS per patient if applicable:Resp 12   GENERAL: alert,  oriented, appears well and in no acute distress  HEENT: atraumatic, conjunttiva clear, no obvious facial abnormalities on inspection.  LUNGS: on inspection no signs of respiratory distress, breathing rate appears normal, no obvious gross SOB, gasping or wheezing  CV: no obvious cyanosis  MS: moves all visible extremities without noticeable abnormality.  No signs of synovitis.  SKIN: No facial rash appreciated.  PSYCH/NEURO: pleasant and cooperative, no obvious depression, she is anxious. Speech and thought processing grossly intact  ASSESSMENT AND PLAN:  Discussed the following assessment and plan: Orders Placed This Encounter  Procedures  . CBC with Differential/Platelet  . Sedimentation rate  . C-reactive protein  . Basic metabolic panel  . Urinalysis, Routine w reflex microscopic    Fever, unspecified  We  discussed possible etiologies. She is not having other symptoms that suggest a serious infectious process. Recommend continue monitoring temperature. Blood work will be arranged. Instructed about warning signs.   Systemic lupus erythematosus (HCC) This problem could explain the fever. She is not having skin rash,urinary symptoms, or arthralgias. She is not interested in short course of prednisone.   I discussed the assessment and treatment plan with the patient. She was provided an opportunity to ask questions and all were answered. The patient agreed with the plan and demonstrated an understanding of the instructions.   The patient was advised to call back or seek an in-person evaluation if the symptoms worsen or if the condition fails to improve as anticipated.   28 min visit. > 50% visit was dedicated to discussion of differentia Dx, prognosis, treatment options, and some side effects of medications (NSAID's and prednisone).   Return if symptoms worsen or fail to improve.    Betty [place], MD

## 2018-10-22 ENCOUNTER — Other Ambulatory Visit: Payer: Self-pay

## 2018-10-22 ENCOUNTER — Other Ambulatory Visit (INDEPENDENT_AMBULATORY_CARE_PROVIDER_SITE_OTHER): Payer: 59

## 2018-10-22 DIAGNOSIS — M329 Systemic lupus erythematosus, unspecified: Secondary | ICD-10-CM | POA: Diagnosis not present

## 2018-10-22 DIAGNOSIS — R509 Fever, unspecified: Secondary | ICD-10-CM | POA: Diagnosis not present

## 2018-10-22 LAB — CBC WITH DIFFERENTIAL/PLATELET
Basophils Absolute: 0 10*3/uL (ref 0.0–0.1)
Basophils Relative: 0.7 % (ref 0.0–3.0)
Eosinophils Absolute: 0.1 10*3/uL (ref 0.0–0.7)
Eosinophils Relative: 0.9 % (ref 0.0–5.0)
HCT: 37.2 % (ref 36.0–46.0)
Hemoglobin: 12.2 g/dL (ref 12.0–15.0)
Lymphocytes Relative: 25.6 % (ref 12.0–46.0)
Lymphs Abs: 1.7 10*3/uL (ref 0.7–4.0)
MCHC: 32.9 g/dL (ref 30.0–36.0)
MCV: 86.7 fl (ref 78.0–100.0)
Monocytes Absolute: 0.5 10*3/uL (ref 0.1–1.0)
Monocytes Relative: 7.1 % (ref 3.0–12.0)
Neutro Abs: 4.2 10*3/uL (ref 1.4–7.7)
Neutrophils Relative %: 65.7 % (ref 43.0–77.0)
Platelets: 344 10*3/uL (ref 150.0–400.0)
RBC: 4.29 Mil/uL (ref 3.87–5.11)
RDW: 14 % (ref 11.5–15.5)
WBC: 6.4 10*3/uL (ref 4.0–10.5)

## 2018-10-22 LAB — URINALYSIS, ROUTINE W REFLEX MICROSCOPIC
Bilirubin Urine: NEGATIVE
Hgb urine dipstick: NEGATIVE
Ketones, ur: NEGATIVE
Leukocytes,Ua: NEGATIVE
Nitrite: NEGATIVE
Specific Gravity, Urine: 1.015 (ref 1.000–1.030)
Total Protein, Urine: NEGATIVE
Urine Glucose: NEGATIVE
Urobilinogen, UA: 0.2 (ref 0.0–1.0)
pH: 7.5 (ref 5.0–8.0)

## 2018-10-22 LAB — BASIC METABOLIC PANEL
BUN: 10 mg/dL (ref 6–23)
CO2: 25 mEq/L (ref 19–32)
Calcium: 8.9 mg/dL (ref 8.4–10.5)
Chloride: 101 mEq/L (ref 96–112)
Creatinine, Ser: 0.72 mg/dL (ref 0.40–1.20)
GFR: 106.94 mL/min (ref >60.00)
Glucose, Bld: 81 mg/dL (ref 70–99)
Potassium: 4 mEq/L (ref 3.5–5.1)
Sodium: 136 mEq/L (ref 135–145)

## 2018-10-22 LAB — C-REACTIVE PROTEIN: CRP: <1 mg/dL (ref 0.5–20.0)

## 2018-10-22 LAB — SEDIMENTATION RATE: Sed Rate: 56 mm/hr — ABNORMAL HIGH (ref 0–20)

## 2018-10-28 ENCOUNTER — Encounter: Payer: Self-pay | Admitting: Family Medicine

## 2019-02-25 ENCOUNTER — Other Ambulatory Visit: Payer: Self-pay

## 2019-02-25 DIAGNOSIS — Z20822 Contact with and (suspected) exposure to covid-19: Secondary | ICD-10-CM

## 2019-02-27 LAB — NOVEL CORONAVIRUS, NAA: SARS-CoV-2, NAA: NOT DETECTED

## 2019-07-07 ENCOUNTER — Other Ambulatory Visit: Payer: Self-pay

## 2019-07-07 ENCOUNTER — Ambulatory Visit (INDEPENDENT_AMBULATORY_CARE_PROVIDER_SITE_OTHER): Payer: No Typology Code available for payment source | Admitting: Bariatrics

## 2019-07-07 ENCOUNTER — Encounter (INDEPENDENT_AMBULATORY_CARE_PROVIDER_SITE_OTHER): Payer: Self-pay | Admitting: Bariatrics

## 2019-07-07 VITALS — BP 162/100 | HR 81 | Temp 98.6°F | Ht 61.0 in | Wt 185.0 lb

## 2019-07-07 DIAGNOSIS — E559 Vitamin D deficiency, unspecified: Secondary | ICD-10-CM

## 2019-07-07 DIAGNOSIS — R0602 Shortness of breath: Secondary | ICD-10-CM | POA: Diagnosis not present

## 2019-07-07 DIAGNOSIS — R5383 Other fatigue: Secondary | ICD-10-CM | POA: Diagnosis not present

## 2019-07-07 DIAGNOSIS — Z9189 Other specified personal risk factors, not elsewhere classified: Secondary | ICD-10-CM | POA: Diagnosis not present

## 2019-07-07 DIAGNOSIS — Z1331 Encounter for screening for depression: Secondary | ICD-10-CM

## 2019-07-07 DIAGNOSIS — M797 Fibromyalgia: Secondary | ICD-10-CM

## 2019-07-07 DIAGNOSIS — E538 Deficiency of other specified B group vitamins: Secondary | ICD-10-CM

## 2019-07-07 DIAGNOSIS — G4733 Obstructive sleep apnea (adult) (pediatric): Secondary | ICD-10-CM

## 2019-07-07 DIAGNOSIS — Z6835 Body mass index (BMI) 35.0-35.9, adult: Secondary | ICD-10-CM

## 2019-07-07 DIAGNOSIS — Z0289 Encounter for other administrative examinations: Secondary | ICD-10-CM

## 2019-07-08 ENCOUNTER — Encounter (INDEPENDENT_AMBULATORY_CARE_PROVIDER_SITE_OTHER): Payer: Self-pay | Admitting: Bariatrics

## 2019-07-09 LAB — COMPREHENSIVE METABOLIC PANEL
ALT: 9 IU/L (ref 0–32)
AST: 18 IU/L (ref 0–40)
Albumin/Globulin Ratio: 1.7 (ref 1.2–2.2)
Albumin: 4.8 g/dL (ref 3.8–4.8)
Alkaline Phosphatase: 98 IU/L (ref 39–117)
BUN/Creatinine Ratio: 17 (ref 9–23)
BUN: 11 mg/dL (ref 6–24)
Bilirubin Total: <0.2 mg/dL (ref 0.0–1.2)
CO2: 21 mmol/L (ref 20–29)
Calcium: 9.4 mg/dL (ref 8.7–10.2)
Chloride: 100 mmol/L (ref 96–106)
Creatinine, Ser: 0.66 mg/dL (ref 0.57–1.00)
GFR calc Af Amer: 125 mL/min/{1.73_m2} (ref >59)
GFR calc non Af Amer: 109 mL/min/{1.73_m2} (ref >59)
Globulin, Total: 2.8 g/dL (ref 1.5–4.5)
Glucose: 81 mg/dL (ref 65–99)
Potassium: 4.1 mmol/L (ref 3.5–5.2)
Sodium: 139 mmol/L (ref 134–144)
Total Protein: 7.6 g/dL (ref 6.0–8.5)

## 2019-07-09 LAB — INSULIN, RANDOM: INSULIN: 9.5 u[IU]/mL (ref 2.6–24.9)

## 2019-07-09 LAB — LIPID PANEL WITH LDL/HDL RATIO
Cholesterol, Total: 223 mg/dL — ABNORMAL HIGH (ref 100–199)
HDL: 81 mg/dL (ref >39)
LDL Chol Calc (NIH): 128 mg/dL — ABNORMAL HIGH (ref 0–99)
LDL/HDL Ratio: 1.6 ratio (ref 0.0–3.2)
Triglycerides: 81 mg/dL (ref 0–149)
VLDL Cholesterol Cal: 14 mg/dL (ref 5–40)

## 2019-07-09 LAB — TSH: TSH: 1.97 u[IU]/mL (ref 0.450–4.500)

## 2019-07-09 LAB — T4, FREE: Free T4: 0.99 ng/dL (ref 0.82–1.77)

## 2019-07-09 LAB — HEMOGLOBIN A1C
Est. average glucose Bld gHb Est-mCnc: 111 mg/dL
Hgb A1c MFr Bld: 5.5 % (ref 4.8–5.6)

## 2019-07-09 LAB — VITAMIN B12: Vitamin B-12: 685 pg/mL (ref 232–1245)

## 2019-07-09 LAB — T3: T3, Total: 100 ng/dL (ref 71–180)

## 2019-07-09 LAB — VITAMIN D 25 HYDROXY (VIT D DEFICIENCY, FRACTURES): Vit D, 25-Hydroxy: 13.2 ng/mL — ABNORMAL LOW (ref 30.0–100.0)

## 2019-07-12 ENCOUNTER — Encounter (INDEPENDENT_AMBULATORY_CARE_PROVIDER_SITE_OTHER): Payer: Self-pay | Admitting: Bariatrics

## 2019-07-12 DIAGNOSIS — E559 Vitamin D deficiency, unspecified: Secondary | ICD-10-CM | POA: Insufficient documentation

## 2019-07-14 ENCOUNTER — Encounter: Payer: Self-pay | Admitting: Family Medicine

## 2019-07-14 ENCOUNTER — Telehealth (INDEPENDENT_AMBULATORY_CARE_PROVIDER_SITE_OTHER): Payer: Self-pay | Admitting: Family Medicine

## 2019-07-14 VITALS — BP 124/82 | Ht 61.0 in

## 2019-07-14 DIAGNOSIS — M20011 Mallet finger of right finger(s): Secondary | ICD-10-CM

## 2019-07-14 DIAGNOSIS — S6990XA Unspecified injury of unspecified wrist, hand and finger(s), initial encounter: Secondary | ICD-10-CM

## 2019-07-14 DIAGNOSIS — I10 Essential (primary) hypertension: Secondary | ICD-10-CM

## 2019-07-14 NOTE — Progress Notes (Signed)
Virtual Visit via Video Note   I connected with Sandra Vang on 07/14/19 by a video enabled telemedicine application and verified that I am speaking with the correct person using two identifiers.  Location patient: home Location provider:work office Persons participating in the virtual visit: patient, provider  I discussed the limitations of evaluation and management by telemedicine and the availability of in person appointments. The patient expressed understanding and agreed to proceed.   HPI: Mr. Sandra Vang is a 44 yo right handed female c/o right finger pain after hitting it against the wall. Yesterday around 8-9 Pm she was carrying dishes,almost falling.Rapidily she moved right hand toward wall and sudden pain developed in 5th finger,mainly distal phalange. Mild edema and severe pain,constant. Not able to extend DIP joint.  Pain is exacerbated by movement and palpation, alleviated by having finger taped with 4th finger. Numbness and decreased capillary refills when finger is flexed and resolves with passive etension.  She has not noted cyanosis.   She is also reporting some elevated BP's.  -HTN, stopped HCTZ 25 mg because BP was well controlled with OTC supplements. Last week BP was 175/100, improved after resuming medication. In general med is well tolerated. Negative for severe/frequent headache, visual changes, chest pain, dyspnea, palpitation, focal weakness, or edema.  Lab Results  Component Value Date   CREATININE 0.66 07/07/2019   BUN 11 07/07/2019   NA 139 07/07/2019   K 4.1 07/07/2019   CL 100 07/07/2019   CO2 21 07/07/2019   ROS: See pertinent positives and negatives per HPI.  Past Medical History:  Diagnosis Date  . ADD (attention deficit disorder)   . B12 deficiency   . Chest pain   . Chronic headaches   . Depression   . Fibromyalgia   . Gallbladder disease   . Hypertension   . Insomnia   . Joint pain   . Lactose intolerance   . MRSA (methicillin  resistant Staphylococcus aureus) infection   . OSA (obstructive sleep apnea) 03/24/2015  . Psoriatic arthritis (HCC)   . Systemic lupus erythematosus (HCC) 10/01/2016   -seeing dermatology and rheumatologist (Dr. Kathi Ludwig)  . Vitamin D deficiency     Past Surgical History:  Procedure Laterality Date  . APPENDECTOMY  2005  . CESAREAN SECTION    . TUBAL LIGATION  1998    Family History  Problem Relation Age of Onset  . Alcohol abuse Mother   . Hypertension Mother   . High Cholesterol Mother   . Kidney disease Mother   . Anxiety disorder Mother   . Drug abuse Father   . Hypertension Father   . Emphysema Father   . High Cholesterol Father   . Anxiety disorder Father   . Depression Paternal Aunt   . Depression Paternal Uncle   . Alcohol abuse Maternal Grandmother   . Arthritis Maternal Grandmother   . Cancer Maternal Grandmother   . Hypertension Maternal Grandmother   . Alcohol abuse Maternal Grandfather   . Hypertension Maternal Grandfather   . Arthritis Paternal Grandmother   . Cancer Paternal Grandmother   . Hypertension Paternal Grandmother   . Asthma Paternal Grandmother   . Hypertension Paternal Grandfather   . Asthma Brother   . Asthma Daughter   . Asthma Daughter     Social History   Socioeconomic History  . Marital status: Divorced    Spouse name: Not on file  . Number of children: Not on file  . Years of education: Not on file  .  Highest education level: Not on file  Occupational History  . Occupation: Charity fundraiser  Tobacco Use  . Smoking status: Never Smoker  . Smokeless tobacco: Never Used  Substance and Sexual Activity  . Alcohol use: No    Alcohol/week: 0.0 standard drinks  . Drug use: No  . Sexual activity: Not on file  Other Topics Concern  . 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 Difficulty of Paying Living Expenses: Not on file  Food Insecurity:   . Worried About Programme researcher, broadcasting/film/video in the Last Year: Not on file  . Ran Out of Food in the Last Year: Not on file  Transportation Needs:   . Lack of Transportation (Medical): Not on file  . Lack of Transportation (Non-Medical): Not on file  Physical Activity:   . Days of Exercise per Week: Not on file  . Minutes of Exercise per Session: Not on file  Stress:   . Feeling of Stress : Not on file  Social Connections:   . Frequency of Communication with Friends and Family: Not on file  . Frequency of Social Gatherings with Friends and Family: Not on file  . Attends Religious Services: Not on file  . Active Member of Clubs or Organizations: Not on file  . Attends Banker Meetings: Not on file  . Marital Status: Not on file  Intimate Partner Violence:   . Fear of Current or Ex-Partner: Not on file  . Emotionally Abused: Not on file  . Physically Abused: Not on file  . Sexually Abused: Not on file    Current Outpatient Medications:  .  OVER THE COUNTER MEDICATION, Hawthorne, Disp: , Rfl:  .  OVER THE COUNTER MEDICATION, Hibiscus, Disp: , Rfl:  .  OVER THE COUNTER MEDICATION, Turmeric 1500mg , Disp: , Rfl:  .  OVER THE COUNTER MEDICATION, Beet root as needed, Disp: , Rfl:  .  OVER THE COUNTER MEDICATION, Elderberry syrup with other herbs as needed, Disp: , Rfl:  .  traMADol (ULTRAM) 50 MG tablet, Take 1 tablet (50 mg total) by mouth every 8 (eight) hours as needed for up to 5 days., Disp: 15 tablet, Rfl: 0  EXAM:  VITALS per patient if applicable:BP 124/82   Ht 5\' 1"  (1.549 m)   BMI 34.96 kg/m   GENERAL: alert, oriented, appears well and in no acute distress  HEENT: atraumatic, conjunctiva clear, no obvious abnormalities on inspection.  NECK: normal movements of the head and neck  LUNGS:  on inspection no signs of respiratory distress, breathing rate appears normal, no obvious gross SOB, gasping or wheezing  CV: no obvious cyanosis  Sandra: Right hand 5th finger DIP fixed flexion,not able to extend actively.Normal passive extension. Very painful with light touch. No ecchymosis, mild edema.  PSYCH/NEURO: pleasant and cooperative, no obvious depression,+ or anxiety, speech and thought processing grossly intact  ASSESSMENT AND PLAN:  Discussed the following assessment and plan:  Finger injury, initial encounter Elevation and local ice. She is allergic to sulfas,so NSAID's are not an option for pain control. Tramadol 50 mg tid is an option, she wants to hold on this  for now and will let me know if she decides to take it. Mallet finger of right finger(s) Continue finger buddy taped. Recommend going to ortho acute clinic at 5:30 pm today,where imaging can be done to evaluate for avulsion fracture. Address and phone number given.  Essential hypertension Based on reported readings BP seems to be better controlled after starting HCTZ. Continue low salt diet. Continue monitoring BP regularly.   I discussed the assessment and treatment plan with the patient. Sandra Blackson was provided an opportunity to ask questions and all were answered. She agreed with the plan and demonstrated an understanding of the instructions.    Return if symptoms worsen or fail to improve.    Starlin Steib [place], MD

## 2019-07-14 NOTE — Progress Notes (Signed)
Chief Complaint:   OBESITY Sandra Vang (MR# [id number]) is a 44 y.o. female who presents for evaluation and treatment of obesity and related comorbidities. Current BMI is Body mass index is 34.96 kg/m.[name] Sandra Vang has been struggling with her weight for many years and has been unsuccessful in either losing weight, maintaining weight loss, or reaching her healthy weight goal.  Sandra Vang states she is currently in the action stage of change and ready to dedicate time achieving and maintaining a healthier weight. Sandra Vang is interested in becoming our patient and working on intensive lifestyle modifications including (but not limited to) diet and exercise for weight loss.  Sandra Vang is lactose intolerant. She craves fries. She dislikes pork. She skips breakfast and sometimes lunch.  Sandra Vang's habits were reviewed today and are as follows: Her family sometimes eats meals together, she struggles with family and or coworkers weight loss sabotage, her desired weight loss is 35-45 lbs, she started gaining weight between the ages of 70 and 71, her heaviest weight ever was 190 pounds, she craves fies, she skips breakfast and sometimes lunch or dinner, she is trying to follow a vegetarian diet and she struggles with emotional eating.  Depression Screen Sandra Vang's Food and Mood (modified PHQ-9) score was 6.  Depression screen PHQ 2/9 07/07/2019  Decreased Interest 2  Down, Depressed, Hopeless 0  PHQ - 2 Score 2  Altered sleeping 0  Tired, decreased energy 2  Change in appetite 1  Feeling bad or failure about yourself  0  Trouble concentrating 1  Moving slowly or fidgety/restless 0  Suicidal thoughts 0  PHQ-9 Score 6  Difficult doing work/chores Not difficult at all   Subjective:   Other fatigue.  Shatonya feels her energy is lower than it should be. This has worsened with weight gain and has worsened recently. Yeraldi denies daytime somnolence and admits to waking up still tired. Patient is at risk  for obstructive sleep apnea. Patient generally gets 5-6 hours of sleep per night, and states they generally do not have restfl sleep (sleeps with a 35 month old and a 44 year old). Snoring is present. Apneic episodes are not present. Epworth Sleepiness Score is 4.  Shortness of breath on exertion. Nitza notes increasing shortness of breath with exercising and seems to be worsening over time with weight gain. She notes getting out of breath sooner with activity than she used to. This has gotten worse recently. Iara denies shortness of breath at rest or orthopnea. IC was done.  OSA (obstructive sleep apnea). Xaria has not used her CPAP.  Fibromyalgia. Amazin reports having some joint pain and also has polyarthralgias.  B12 nutritional deficiency. Sandra Vang is on no medication.  Vitamin D deficiency. Sandra Vang is on no medication.  Depression screening.   At risk for heart disease. Tiyonna is at a higher than average risk for cardiovascular disease due to obesity. Reviewed: no chest pain on exertion, no dyspnea on exertion, and no swelling of ankles.  Assessment/Plan:   Other fatigue. Sandra Vang was informed fatigue may be related to obesity, depression or many other causes. Labs will be ordered, and in the meanwhile Mikiya has agreed to work on diet, exercise and weight loss. EKG 12-Lead, T3, T4, free, TSH, HgB A1c, Insulin, random, Comprehensive Metabolic Panel (CMET) were ordered.  Shortness of breath on exertion. Sandra Vang shortness of breath appears to be obesity related and exercise induced. She has agreed to work on weight loss and gradually increase exercise to treat her exercise induced shortness  of breath. Will continue to monitor closely. Lipid Panel With LDL/HDL Ratio ordered. She will gradually increase activities. We discussed IC and will plan _____ accordingly.  OSA (obstructive sleep apnea). Intensive lifestyle modifications are the first line treatment for this issue. We discussed  several lifestyle modifications today and she will continue to work on diet, exercise and weight loss efforts. We will continue to monitor. Orders and follow up as documented in patient record. Sandra Vang will check on getting equipment and will check settings.  Counseling  Sleep apnea is a condition in which breathing pauses or becomes shallow during sleep. This happens over and over during the night. This disrupts your sleep and keeps your body from getting the rest that it needs, which can cause tiredness and lack of energy (fatigue) during the day.  Sleep apnea treatment: If you were given a device to open your airway while you sleep, USE IT!  Sleep hygiene:   Limit or avoid alcohol, caffeinated beverages, and cigarettes, especially close to bedtime.   Do not eat a large meal or eat spicy foods right before bedtime. This can lead to digestive discomfort that can make it hard for you to sleep.  Keep a sleep diary to help you and your health care provider figure out what could be causing your insomnia.  . Make your bedroom a dark, comfortable place where it is easy to fall asleep. ? Put up shades or blackout curtains to block light from outside. ? Use a white noise machine to block noise. ? Keep the temperature cool. . Limit screen use before bedtime. This includes: ? Watching TV. ? Using your smartphone, tablet, or computer. . Stick to a routine that includes going to bed and waking up at the same times every day and night. This can help you fall asleep faster. Consider making a quiet activity, such as reading, part of your nighttime routine. . Try to avoid taking naps during the day so that you sleep better at night. . Get out of bed if you are still awake after 15 minutes of trying to sleep. Keep the lights down, but try reading or doing a quiet activity. When you feel sleepy, go back to bed.  Fibromyalgia. Intensive lifestyle modifications are the first line treatment for this issue. We  discussed several lifestyle modifications today and she will continue to work on diet, exercise and weight loss efforts.We will continue to monitor. Orders and follow up as documented in patient record. She will gradually increase exercise, low impact.  Counseling . Try [email or web address]-[email or web address]/, which is a series of self-care modules designed to teach patients several techniques to manage pain.   B12 nutritional deficiency.  The diagnosis was reviewed with the patient. Counseling provided today, see below. We will continue to monitor. Orders and follow up as documented in patient record. Will check B12 level.  Counseling . The body needs vitamin B12: to make red blood cells; to make DNA; and to help the nerves work properly so they can carry messages from the brain to the body.  . The main causes of vitamin B12 deficiency include dietary deficiency, digestive diseases, pernicious anemia, and having a surgery in which part of the stomach or small intestine is removed.  . Certain medicines can make it harder for the body to absorb vitamin B12. These medicines include: heartburn medications; some antibiotics; some medications used to treat diabetes, gout, and high cholesterol.  . In some cases, there are no symptoms of this condition. If the  condition leads to anemia or nerve damage, various symptoms can occur, such as weakness or fatigue, shortness of breath, and numbness or tingling in your hands and feet.   . Treatment:  o May include taking vitamin B12 supplements.  o Avoid alcohol.  o Eat lots of healthy foods that contain vitamin B12: - Beef, pork, chicken, [place], and organ meats, such as liver.  - Seafood: This includes clams, rainbow trout, salmon, tuna, and haddock. Eggs.  - Cereal and dairy products that are fortified: This means that vitamin B12 has been added to the food.   Vitamin D deficiency.  Low Vitamin D level contributes to fatigue and are associated with obesity,  breast, and colon cancer. She will have Vitamin D level checked.  Depression screening.  At risk for heart disease. Sandra Vang was given approximately 15 minutes of coronary artery disease prevention counseling today. She is 43 y.o. female and has risk factors for heart disease including obesity. We discussed intensive lifestyle modifications today with an emphasis on specific weight loss instructions and strategies.   Class 2 severe obesity with serious comorbidity and body mass index (BMI) of 35.0 to 35.9 in adult, unspecified obesity type (HCC).  Sandra Vang is currently in the action stage of change and her goal is to continue with weight loss efforts. I recommend Sandra Vang begin the structured treatment plan as follows:  She has agreed to on the Category 2 Plan alternating with Pescatarian with protein and calorie goals.  We discussed the following exercise goals today: For substantial health benefits, adults should do at least 150 minutes (2 hours and 30 minutes) a week of moderate-intensity, or 75 minutes (1 hour and 15 minutes) a week of vigorous-intensity aerobic physical activity, or an equivalent combination of moderate- and vigorous-intensity aerobic activity. Aerobic activity should be performed in episodes of at least 10 minutes, and preferably, it should be spread throughout the week. Adults should also include muscle-strengthening activities that involve all major muscle groups on 2 or more days a week.   We discussed the following behavioral modification strategies today: increasing lean protein intake, decreasing simple carbohydrates, increasing vegetables, increasing water intake, decreasing eating out, no skipping meals, meal planning and cooking strategies and keeping healthy foods in the home.  She was informed of the importance of frequent follow-up visits to maximize her success with intensive lifestyle modifications for her multiple health conditions. She was informed we would discuss  her lab results at her next visit unless there is a critical issue that needs to be addressed sooner. Sandra Vang agreed to keep her next visit at the agreed upon time to discuss these results.  Objective:   Blood pressure (!) 162/100, pulse 81, temperature 98.6 F (37 C), height 5\' 1"  (1.549 m), weight 185 lb (83.9 kg), SpO2 97 %. Body mass index is 34.96 kg/m.  EKG: Sinus  Rhythm with a rate of 74 BPM, within normal limits.  Indirect Calorimeter completed today shows a VO2 of 231 and a REE of 1607.  Her calculated basal metabolic rate is thus her basal metabolic rate is better than expected.  General: Cooperative, alert, well developed, in no acute distress. HEENT: Conjunctivae and lids unremarkable. Neck: No thyromegaly.  Cardiovascular: Regular rhythm.  Lungs: Normal work of breathing. Extremities: No edema.  Neurologic: No focal deficits.   Lab Results  Component Value Date   CREATININE 0.66 07/07/2019   BUN 11 07/07/2019   NA 139 07/07/2019   K 4.1 07/07/2019   CL 100  07/07/2019   CO2 21 07/07/2019   Lab Results  Component Value Date   ALT 9 07/07/2019   AST 18 07/07/2019   ALKPHOS 98 07/07/2019   BILITOT <0.2 07/07/2019   Lab Results  Component Value Date   HGBA1C 5.5 07/07/2019   HGBA1C 5.6 10/01/2016   HGBA1C 5.7 (H) 06/16/2015   Lab Results  Component Value Date   INSULIN 9.5 07/07/2019   Lab Results  Component Value Date   TSH 1.970 07/07/2019   Lab Results  Component Value Date   CHOL 223 (H) 07/07/2019   HDL 81 07/07/2019   LDLCALC 128 (H) 07/07/2019   TRIG 81 07/07/2019   CHOLHDL 3 10/01/2016   Lab Results  Component Value Date   WBC 6.4 10/22/2018   HGB 12.2 10/22/2018   HCT 37.2 10/22/2018   MCV 86.7 10/22/2018   PLT 344.0 10/22/2018   Lab Results  Component Value Date   FERRITIN 46 06/16/2015   Attestation Statements:   Reviewed by clinician on day of visit: allergies, medications, problem list, medical history, surgical  history, family history, social history, and previous encounter notes.  Fernanda Drum, am acting as Energy manager for [employer], DO   I have reviewed the above documentation for accuracy and completeness, and I agree with the above. Corinna Capra, DO

## 2019-07-16 ENCOUNTER — Encounter: Payer: Self-pay | Admitting: Family Medicine

## 2019-07-16 ENCOUNTER — Other Ambulatory Visit: Payer: Self-pay | Admitting: Family Medicine

## 2019-07-16 DIAGNOSIS — S6990XA Unspecified injury of unspecified wrist, hand and finger(s), initial encounter: Secondary | ICD-10-CM

## 2019-07-16 DIAGNOSIS — M20011 Mallet finger of right finger(s): Secondary | ICD-10-CM

## 2019-07-16 MED ORDER — TRAMADOL HCL 50 MG PO TABS: 50.0000 mg | ORAL_TABLET | Freq: Three times a day (TID) | ORAL | 0 refills | Status: AC | PRN

## 2019-07-16 NOTE — Telephone Encounter (Signed)
Pt called stating that she is in a lot of pain still and is requesting to have a response as she has been trying to get in contact with someone since Wednesday. Please advise.

## 2019-07-17 ENCOUNTER — Encounter: Payer: Self-pay | Admitting: Family Medicine

## 2019-07-19 ENCOUNTER — Encounter (INDEPENDENT_AMBULATORY_CARE_PROVIDER_SITE_OTHER): Payer: Self-pay | Admitting: Bariatrics

## 2019-07-21 ENCOUNTER — Ambulatory Visit (INDEPENDENT_AMBULATORY_CARE_PROVIDER_SITE_OTHER): Payer: Self-pay | Admitting: Bariatrics

## 2019-07-26 ENCOUNTER — Ambulatory Visit (INDEPENDENT_AMBULATORY_CARE_PROVIDER_SITE_OTHER): Payer: No Typology Code available for payment source | Admitting: Bariatrics

## 2019-08-12 ENCOUNTER — Telehealth: Payer: Self-pay

## 2019-08-26 ENCOUNTER — Encounter: Payer: Self-pay | Admitting: Family Medicine

## 2019-08-26 DIAGNOSIS — I10 Essential (primary) hypertension: Secondary | ICD-10-CM

## 2019-08-27 MED ORDER — HYDROCHLOROTHIAZIDE 25 MG PO TABS: 25.0000 mg | ORAL_TABLET | Freq: Every day | ORAL | 1 refills | Status: DC

## 2020-02-17 ENCOUNTER — Emergency Department (HOSPITAL_BASED_OUTPATIENT_CLINIC_OR_DEPARTMENT_OTHER)
Admission: EM | Admit: 2020-02-17 | Discharge: 2020-02-17 | Disposition: A | Discharge status: Home / Self Care | Payer: No Typology Code available for payment source | Attending: Emergency Medicine | Admitting: Emergency Medicine

## 2020-02-17 ENCOUNTER — Encounter (HOSPITAL_BASED_OUTPATIENT_CLINIC_OR_DEPARTMENT_OTHER): Payer: Self-pay | Admitting: *Deleted

## 2020-02-17 ENCOUNTER — Telehealth (INDEPENDENT_AMBULATORY_CARE_PROVIDER_SITE_OTHER): Payer: Self-pay | Admitting: Family Medicine

## 2020-02-17 ENCOUNTER — Other Ambulatory Visit: Payer: Self-pay

## 2020-02-17 ENCOUNTER — Encounter: Payer: Self-pay | Admitting: Family Medicine

## 2020-02-17 VITALS — Wt 177.0 lb

## 2020-02-17 DIAGNOSIS — R319 Hematuria, unspecified: Secondary | ICD-10-CM | POA: Diagnosis not present

## 2020-02-17 DIAGNOSIS — Z79899 Other long term (current) drug therapy: Secondary | ICD-10-CM

## 2020-02-17 DIAGNOSIS — Z3202 Encounter for pregnancy test, result negative: Secondary | ICD-10-CM | POA: Diagnosis not present

## 2020-02-17 DIAGNOSIS — N390 Urinary tract infection, site not specified: Secondary | ICD-10-CM | POA: Diagnosis not present

## 2020-02-17 DIAGNOSIS — M549 Dorsalgia, unspecified: Secondary | ICD-10-CM | POA: Diagnosis not present

## 2020-02-17 DIAGNOSIS — R109 Unspecified abdominal pain: Secondary | ICD-10-CM

## 2020-02-17 DIAGNOSIS — R300 Dysuria: Secondary | ICD-10-CM

## 2020-02-17 DIAGNOSIS — I10 Essential (primary) hypertension: Secondary | ICD-10-CM | POA: Diagnosis not present

## 2020-02-17 DIAGNOSIS — N946 Dysmenorrhea, unspecified: Secondary | ICD-10-CM

## 2020-02-17 LAB — CBC WITH DIFFERENTIAL/PLATELET
Abs Immature Granulocytes: 0.04 10*3/uL (ref 0.00–0.07)
Basophils Absolute: 0.1 10*3/uL (ref 0.0–0.1)
Basophils Relative: 1 %
Eosinophils Absolute: 0.1 10*3/uL (ref 0.0–0.5)
Eosinophils Relative: 1 %
HCT: 37.4 % (ref 36.0–46.0)
Hemoglobin: 11.7 g/dL — ABNORMAL LOW (ref 12.0–15.0)
Immature Granulocytes: 0 %
Lymphocytes Relative: 23 %
Lymphs Abs: 2.8 10*3/uL (ref 0.7–4.0)
MCH: 27.6 pg (ref 26.0–34.0)
MCHC: 31.3 g/dL (ref 30.0–36.0)
MCV: 88.2 fL (ref 80.0–100.0)
Monocytes Absolute: 0.8 10*3/uL (ref 0.1–1.0)
Monocytes Relative: 7 %
Neutro Abs: 8.7 10*3/uL — ABNORMAL HIGH (ref 1.7–7.7)
Neutrophils Relative %: 68 %
Platelets: 400 10*3/uL (ref 150–400)
RBC: 4.24 MIL/uL (ref 3.87–5.11)
RDW: 14.5 % (ref 11.5–15.5)
WBC: 12.5 10*3/uL — ABNORMAL HIGH (ref 4.0–10.5)
nRBC: 0 % (ref 0.0–0.2)

## 2020-02-17 LAB — URINALYSIS, MICROSCOPIC (REFLEX)

## 2020-02-17 LAB — URINALYSIS, ROUTINE W REFLEX MICROSCOPIC
Glucose, UA: 100 mg/dL — AB
Ketones, ur: NEGATIVE mg/dL
Nitrite: POSITIVE — AB
Protein, ur: 100 mg/dL — AB
Specific Gravity, Urine: 1.025 (ref 1.005–1.030)
pH: 5.5 (ref 5.0–8.0)

## 2020-02-17 LAB — BASIC METABOLIC PANEL
Anion gap: 10 (ref 5–15)
BUN: 11 mg/dL (ref 6–20)
CO2: 25 mmol/L (ref 22–32)
Calcium: 8.8 mg/dL — ABNORMAL LOW (ref 8.9–10.3)
Chloride: 102 mmol/L (ref 98–111)
Creatinine, Ser: 0.72 mg/dL (ref 0.44–1.00)
GFR calc Af Amer: >60 mL/min (ref >60)
GFR calc non Af Amer: >60 mL/min (ref >60)
Glucose, Bld: 88 mg/dL (ref 70–99)
Potassium: 3.2 mmol/L — ABNORMAL LOW (ref 3.5–5.1)
Sodium: 137 mmol/L (ref 135–145)

## 2020-02-17 LAB — PREGNANCY, URINE: Preg Test, Ur: NEGATIVE

## 2020-02-17 MED ORDER — CEFPODOXIME PROXETIL 200 MG PO TABS: 200.0000 mg | ORAL_TABLET | Freq: Two times a day (BID) | ORAL | 0 refills | Status: AC

## 2020-02-17 MED ORDER — SODIUM CHLORIDE 0.9 % IV SOLN: INTRAVENOUS | Status: DC | PRN

## 2020-02-17 MED ORDER — SODIUM CHLORIDE 0.9 % IV SOLN
1.0000 g | Freq: Once | INTRAVENOUS | Status: AC
  Administered 2020-02-17: 1 g via INTRAVENOUS
  Filled 2020-02-17: qty 10

## 2020-02-17 MED ORDER — CIPROFLOXACIN HCL 500 MG PO TABS: 500.0000 mg | ORAL_TABLET | Freq: Two times a day (BID) | ORAL | 0 refills | Status: DC

## 2020-02-17 NOTE — ED Notes (Signed)
Spoke with Issac in lab to add a urine culture

## 2020-02-17 NOTE — Progress Notes (Signed)
Virtual Visit via Video Note  I connected with Sandra Vang  on 02/17/20 at  3:20 PM EDT by a video enabled telemedicine application and verified that I am speaking with the correct person using two identifiers.  Location patient: home, [place] Location provider:work or home office Persons participating in the virtual visit: patient, provider  I discussed the limitations of evaluation and management by telemedicine and the availability of in person appointments. The patient expressed understanding and agreed to proceed.   HPI:  Acute visit for dysuria: -symptoms started 4 days ago -symptoms urinary urgency, frequency, burning, then developed a pink tinge to the urine -  on her menstrual cycle the last few days, also having a little abd/flank discomfort bilat - but gets cramps and nausea with her cycle sometimes, she was sexually active the day before symptoms started and had not been in a long time -denies fevers, malaise, vomiting, diarrhea,  -she has a history of UTIs and kidney infection remotely -she does not want to go anywhere for in-person care due to covid concerns with the delta surge but she also is worried about pyelonephritis   ROS: See pertinent positives and negatives per HPI.  Past Medical History:  Diagnosis Date  . ADD (attention deficit disorder)   . B12 deficiency   . Chest pain   . Chronic headaches   . Depression   . Fibromyalgia   . Gallbladder disease   . Hypertension   . Insomnia   . Joint pain   . Lactose intolerance   . MRSA (methicillin resistant Staphylococcus aureus) infection   . OSA (obstructive sleep apnea) 03/24/2015  . Psoriatic arthritis (HCC)   . Systemic lupus erythematosus (HCC) 10/01/2016   -seeing dermatology and rheumatologist (Dr. Kathi Ludwig)  . Vitamin D deficiency     Past Surgical History:  Procedure Laterality Date  . APPENDECTOMY  2005  . CESAREAN SECTION    . TUBAL LIGATION  1998    Family History  Problem Relation Age of Onset  .  Alcohol abuse Mother   . Hypertension Mother   . High Cholesterol Mother   . Kidney disease Mother   . Anxiety disorder Mother   . Drug abuse Father   . Hypertension Father   . Emphysema Father   . High Cholesterol Father   . Anxiety disorder Father   . Depression Paternal Aunt   . Depression Paternal Uncle   . Alcohol abuse Maternal Grandmother   . Arthritis Maternal Grandmother   . Cancer Maternal Grandmother   . Hypertension Maternal Grandmother   . Alcohol abuse Maternal Grandfather   . Hypertension Maternal Grandfather   . Arthritis Paternal Grandmother   . Cancer Paternal Grandmother   . Hypertension Paternal Grandmother   . Asthma Paternal Grandmother   . Hypertension Paternal Grandfather   . Asthma Brother   . Asthma Daughter   . Asthma Daughter     SOCIAL HX: see hpi   Current Outpatient Medications:  .  hydrochlorothiazide (HYDRODIURIL) 25 MG tablet, Take 1 tablet (25 mg total) by mouth daily., Disp: 90 tablet, Rfl: 1 .  OVER THE COUNTER MEDICATION, [place], Disp: , Rfl:  .  OVER THE COUNTER MEDICATION, Hibiscus, Disp: , Rfl:  .  OVER THE COUNTER MEDICATION, Turmeric 1500mg , Disp: , Rfl:  .  OVER THE COUNTER MEDICATION, Beet root as needed, Disp: , Rfl:  .  OVER THE COUNTER MEDICATION, Elderberry syrup with other herbs as needed, Disp: , Rfl:   EXAM:  VITALS per  patient if applicable:  GENERAL: alert, oriented, appears well and in no acute distress  HEENT: atraumatic, conjunttiva clear, no obvious abnormalities on inspection of external nose and ears  NECK: normal movements of the head and neck  LUNGS: on inspection no signs of respiratory distress, breathing rate appears normal, no obvious gross SOB, gasping or wheezing  CV: no obvious cyanosis  MS: moves all visible extremities without noticeable abnormality  ABD: points to RUQ and LUQ wrapping around to back as area of abd discomfort  PSYCH/NEURO: pleasant and cooperative, no obvious depression  or anxiety, speech and thought processing grossly intact  ASSESSMENT AND PLAN:  Discussed the following assessment and plan:  No diagnosis found.  -we discussed possible serious and likely etiologies, options for evaluation and workup, limitations of telemedicine visit vs in person visit, treatment, treatment risks and precautions. Pt prefers to treat via telemedicine empirically rather then risking or undertaking an in person visit at this moment.  Query UTI with menstrual cycle, pyelo vs other. Did advise inperson evaluation given concern for complicated UTI. She does not have a fever or and appear well. She prefers to avoid in person care if possible. Opted after a lengthy discussion for cipro 500mg  bid x 5-7 days. Discussed risks at length. Advised to seek prompt in person care if worsening, new symptoms arise, or if is not improving with treatment.   Work/School slipped offered: declined  I discussed the assessment and treatment plan with the patient. The patient was provided an opportunity to ask questions and all were answered. The patient agreed with the plan and demonstrated an understanding of the instructions.   The patient was advised to call back or seek an in-person evaluation if the symptoms worsen or if the condition fails to improve as anticipated.   , DO

## 2020-02-17 NOTE — Patient Instructions (Signed)
-  I sent the medication(s) we discussed to your pharmacy: Meds ordered this encounter  Medications  . ciprofloxacin (CIPRO) 500 MG tablet    Sig: Take 1 tablet (500 mg total) by mouth 2 (two) times daily.    Dispense:  14 tablet    Refill:  0    I hope you are feeling better soon! Seek in person care promptly if your symptoms worsen, new concerns arise or you are not improving with treatment over the next 1-2 days.

## 2020-02-17 NOTE — ED Provider Notes (Signed)
Emergency Department Provider Note   I have reviewed the triage vital signs and the nursing notes.   HISTORY  Chief Complaint Dysuria   HPI Sandra Vang is a 44 y.o. female with past medical history reviewed below presents to the emergency department for evaluation of dysuria with some mild left-sided back pain.  Symptoms have been ongoing for  the past several days.  No particular fevers, chills but patient has been feeling some muscle aches.  No respiratory symptoms.  Denies any vaginal bleeding or discharge.  She is having some pain with urination as well as frequency and urgency. Pain in the left flank is mild to moderate. No radiation of symptoms or modifying factors.   Past Medical History:  Diagnosis Date  . ADD (attention deficit disorder)   . B12 deficiency   . Chest pain   . Chronic headaches   . Depression   . Fibromyalgia   . Gallbladder disease   . Hypertension   . Insomnia   . Joint pain   . Lactose intolerance   . MRSA (methicillin resistant Staphylococcus aureus) infection   . OSA (obstructive sleep apnea) 03/24/2015  . Psoriatic arthritis (HCC)   . Systemic lupus erythematosus (HCC) 10/01/2016   -seeing dermatology and rheumatologist (Dr. Kathi Ludwig)  . Vitamin D deficiency     Patient Active Problem List   Diagnosis Date Noted  . Vitamin D deficiency 07/12/2019  . Systemic lupus erythematosus (HCC) 10/01/2016  . Polyarthralgia 09/15/2015  . OSA (obstructive sleep apnea) 03/24/2015  . Fibromyalgia 09/10/2012  . Essential hypertension 08/21/2012  . Class 1 obesity with serious comorbidity and body mass index (BMI) of 34.0 to 34.9 in adult 08/21/2012    Past Surgical History:  Procedure Laterality Date  . APPENDECTOMY  2005  . CESAREAN SECTION    . TUBAL LIGATION  1998    Allergies Iodinated diagnostic agents, Dexamethasone, Iohexol, Sulfa antibiotics, and Sulfasalazine  Family History  Problem Relation Age of Onset  . Alcohol abuse Mother   .  Hypertension Mother   . High Cholesterol Mother   . Kidney disease Mother   . Anxiety disorder Mother   . Drug abuse Father   . Hypertension Father   . Emphysema Father   . High Cholesterol Father   . Anxiety disorder Father   . Depression Paternal Aunt   . Depression Paternal Uncle   . Alcohol abuse Maternal Grandmother   . Arthritis Maternal Grandmother   . Cancer Maternal Grandmother   . Hypertension Maternal Grandmother   . Alcohol abuse Maternal Grandfather   . Hypertension Maternal Grandfather   . Arthritis Paternal Grandmother   . Cancer Paternal Grandmother   . Hypertension Paternal Grandmother   . Asthma Paternal Grandmother   . Hypertension Paternal Grandfather   . Asthma Brother   . Asthma Daughter   . Asthma Daughter     Social History Social History   Tobacco Use  . Smoking status: Never Smoker  . Smokeless tobacco: Never Used  Vaping Use  . Vaping Use: Never used  Substance Use Topics  . Alcohol use: No    Alcohol/week: 0.0 standard drinks  . Drug use: No    Review of Systems  Constitutional: No fever/chills. Positive mild body aches.  Eyes: No visual changes. ENT: No sore throat. Cardiovascular: Denies chest pain. Respiratory: Denies shortness of breath. Gastrointestinal: No abdominal pain. Mild left flank pain. No nausea, no vomiting.  No diarrhea.  No constipation. Genitourinary: Positive for dysuria, hesitancy,  and urgency.  Musculoskeletal: Negative for back pain. Skin: Negative for rash. Neurological: Negative for headaches, focal weakness or numbness.  10-point ROS otherwise negative.  ____________________________________________   PHYSICAL EXAM:  VITAL SIGNS: ED Triage Vitals  Enc Vitals Group     BP 02/17/20 1836 (!) 163/85     Pulse Rate 02/17/20 1836 91     Resp 02/17/20 1836 18     Temp 02/17/20 1836 98.3 F (36.8 C)     Temp Source 02/17/20 1836 Oral     SpO2 02/17/20 1836 100 %     Weight 02/17/20 1837 182 lb 9.6 oz  (82.8 kg)     Height 02/17/20 1837 5\' 1"  (1.549 m)    Constitutional: Alert and oriented. Well appearing and in no acute distress. Eyes: Conjunctivae are normal. Head: Atraumatic. Nose: No congestion/rhinnorhea. Mouth/Throat: Mucous membranes are moist.  Neck: No stridor.   Cardiovascular: Normal rate, regular rhythm. Good peripheral circulation. Grossly normal heart sounds.   Respiratory: Normal respiratory effort.  No retractions. Lungs CTAB. Gastrointestinal: Soft and nontender. No distention. No CVA tenderness.  Musculoskeletal: No lower extremity tenderness nor edema. No gross deformities of extremities. Neurologic:  Normal speech and language. No gross focal neurologic deficits are appreciated.  Skin:  Skin is warm, dry and intact. No rash noted.  ____________________________________________   LABS (all labs ordered are listed, but only abnormal results are displayed)  Labs Reviewed  URINE CULTURE - Abnormal; Notable for the following components:      Result Value   Culture >=100,000 COLONIES/mL ESCHERICHIA COLI (*)    Organism ID, Bacteria ESCHERICHIA COLI (*)    All other components within normal limits  URINALYSIS, ROUTINE W REFLEX MICROSCOPIC - Abnormal; Notable for the following components:   Color, Urine BROWN (*)    APPearance TURBID (*)    Glucose, UA 100 (*)    Hgb urine dipstick LARGE (*)    Bilirubin Urine SMALL (*)    Protein, ur 100 (*)    Nitrite POSITIVE (*)    Leukocytes,Ua SMALL (*)    All other components within normal limits  URINALYSIS, MICROSCOPIC (REFLEX) - Abnormal; Notable for the following components:   Bacteria, UA FEW (*)    All other components within normal limits  BASIC METABOLIC PANEL - Abnormal; Notable for the following components:   Potassium 3.2 (*)    Calcium 8.8 (*)    All other components within normal limits  CBC WITH DIFFERENTIAL/PLATELET - Abnormal; Notable for the following components:   WBC 12.5 (*)    Hemoglobin 11.7 (*)     Neutro Abs 8.7 (*)    All other components within normal limits  RPR - Abnormal; Notable for the following components:   RPR Ser Ql Non Reactive (*)    All other components within normal limits  PREGNANCY, URINE  HIV ANTIBODY (ROUTINE TESTING W REFLEX)  GC/CHLAMYDIA PROBE AMP ([place]) NOT AT [facility]   ____________________________________________   PROCEDURES  Procedure(s) performed:   Procedures  None  ____________________________________________   INITIAL IMPRESSION / ASSESSMENT AND PLAN / ED COURSE  Pertinent labs & imaging results that were available during my care of the patient were reviewed by me and considered in my medical decision making (see chart for details).   Patient presents to the emergency department with dysuria, hesitancy, urgency.  Her vital signs are within normal limits and do not meet SIRS criteria.  She is having some pain in the left flank but no CVA tenderness.  I do plan to treat for urinary tract infection with IV Rocephin here followed by cephalosporin at home for 2-week course to treat possible early pyelonephritis.  Will send the urine for culture and follow on sensitivities.   Patient also mentions a new sexual partner and is interested in obtaining testing for sexually transmitted infection.  She is not having vaginal discharge.  She is having some vaginal bleeding with her menstrual cycle starting yesterday.  Will move forward with blood testing as well as urine GC and chlamydia but patient to follow with her OB/GYN once her vaginal bleeding has stopped and can obtain further swab/testing as needed.    ____________________________________________  FINAL CLINICAL IMPRESSION(S) / ED DIAGNOSES  Final diagnoses:  Urinary tract infection with hematuria, site unspecified     MEDICATIONS GIVEN DURING THIS VISIT:  Medications  cefTRIAXone (ROCEPHIN) 1 g in sodium chloride 0.9 % 100 mL IVPB ( Intravenous Stopped 02/17/20 2140)     NEW  OUTPATIENT MEDICATIONS STARTED DURING THIS VISIT:  Discharge Medication List as of 02/17/2020 11:00 PM    START taking these medications   Details  cefpodoxime (VANTIN) 200 MG tablet Take 1 tablet (200 mg total) by mouth 2 (two) times daily for 14 days., Starting Thu 02/17/2020, Until Thu 03/02/2020, Normal        Note:  This document was prepared using Dragon voice recognition software and may include unintentional dictation errors.  Alona Bene, MD, [facility]    Kipper Buch, Arlyss Repress, MD 02/21/20 [id number]

## 2020-02-17 NOTE — ED Triage Notes (Signed)
Dysuria, urgency and frequency.

## 2020-02-17 NOTE — Discharge Instructions (Signed)
You were seen in the emergency room today with urinary tract infection. Because you are experiencing some pain along your back I am treating you for possible kidney infection. Please take the antibiotic as prescribed for the entire 2 weeks. Return to the emergency department if you develop fever, shaking chills, severe or worsening pain. Please follow closely with your primary care doctor.  We did send off additional lab tests as discussed which will come back either later tonight or in the next 1 to 2 days in the MyChart app.

## 2020-02-18 ENCOUNTER — Encounter: Payer: Self-pay | Admitting: Family Medicine

## 2020-02-18 ENCOUNTER — Telehealth: Payer: Self-pay | Admitting: Family Medicine

## 2020-02-18 ENCOUNTER — Telehealth (HOSPITAL_COMMUNITY): Payer: Self-pay

## 2020-02-18 LAB — HIV ANTIBODY (ROUTINE TESTING W REFLEX): HIV Screen 4th Generation wRfx: NONREACTIVE

## 2020-02-18 NOTE — Telephone Encounter (Signed)
I spoke with patient. Questions answered & pt aware test results are still pending on some tests.

## 2020-02-18 NOTE — Telephone Encounter (Signed)
Pt is calling in wanting to have her lab results from the ER on last night and would like to see if someone can call her with the results.  Pt wanted to know why she was able to see her lab results in mychart and the provider is not able to give her the results.

## 2020-02-19 ENCOUNTER — Emergency Department (HOSPITAL_BASED_OUTPATIENT_CLINIC_OR_DEPARTMENT_OTHER): Payer: No Typology Code available for payment source

## 2020-02-19 ENCOUNTER — Other Ambulatory Visit: Payer: Self-pay

## 2020-02-19 ENCOUNTER — Encounter (HOSPITAL_BASED_OUTPATIENT_CLINIC_OR_DEPARTMENT_OTHER): Payer: Self-pay | Admitting: Emergency Medicine

## 2020-02-19 DIAGNOSIS — Z5321 Procedure and treatment not carried out due to patient leaving prior to being seen by health care provider: Secondary | ICD-10-CM | POA: Diagnosis not present

## 2020-02-19 DIAGNOSIS — M25512 Pain in left shoulder: Secondary | ICD-10-CM | POA: Insufficient documentation

## 2020-02-19 LAB — BASIC METABOLIC PANEL
Anion gap: 11 (ref 5–15)
BUN: 10 mg/dL (ref 6–20)
CO2: 26 mmol/L (ref 22–32)
Calcium: 9.1 mg/dL (ref 8.9–10.3)
Chloride: 99 mmol/L (ref 98–111)
Creatinine, Ser: 0.61 mg/dL (ref 0.44–1.00)
GFR calc Af Amer: >60 mL/min (ref >60)
GFR calc non Af Amer: >60 mL/min (ref >60)
Glucose, Bld: 107 mg/dL — ABNORMAL HIGH (ref 70–99)
Potassium: 3.2 mmol/L — ABNORMAL LOW (ref 3.5–5.1)
Sodium: 136 mmol/L (ref 135–145)

## 2020-02-19 LAB — CBC
HCT: 39.7 % (ref 36.0–46.0)
Hemoglobin: 12.4 g/dL (ref 12.0–15.0)
MCH: 27.3 pg (ref 26.0–34.0)
MCHC: 31.2 g/dL (ref 30.0–36.0)
MCV: 87.4 fL (ref 80.0–100.0)
Platelets: 438 10*3/uL — ABNORMAL HIGH (ref 150–400)
RBC: 4.54 MIL/uL (ref 3.87–5.11)
RDW: 13.9 % (ref 11.5–15.5)
WBC: 9.2 10*3/uL (ref 4.0–10.5)
nRBC: 0 % (ref 0.0–0.2)

## 2020-02-19 LAB — TROPONIN I (HIGH SENSITIVITY): Troponin I (High Sensitivity): 4 ng/L (ref <18)

## 2020-02-19 LAB — PREGNANCY, URINE: Preg Test, Ur: NEGATIVE

## 2020-02-19 NOTE — ED Triage Notes (Addendum)
Woke up with L shoulder pain Friday. Started on abx Friday for UTI. Denies injury

## 2020-02-20 ENCOUNTER — Emergency Department (HOSPITAL_BASED_OUTPATIENT_CLINIC_OR_DEPARTMENT_OTHER)
Admission: EM | Admit: 2020-02-20 | Discharge: 2020-02-20 | Disposition: A | Discharge status: Home / Self Care | Payer: No Typology Code available for payment source | Attending: Emergency Medicine | Admitting: Emergency Medicine

## 2020-02-20 LAB — URINE CULTURE: Culture: >=100000 — AB

## 2020-02-20 LAB — RPR: RPR Ser Ql: NONREACTIVE — AB

## 2020-02-21 ENCOUNTER — Telehealth: Payer: Self-pay | Admitting: *Deleted

## 2020-02-21 LAB — GC/CHLAMYDIA PROBE AMP (~~LOC~~) NOT AT ARMC
Chlamydia: NEGATIVE
Comment: NEGATIVE
Comment: NORMAL
Neisseria Gonorrhea: NEGATIVE

## 2020-02-21 NOTE — Telephone Encounter (Signed)
Post ED Visit - Positive Culture Follow-up  Culture report reviewed by antimicrobial stewardship pharmacist: [facility] []  , Pharm.D. []  Enzo Bi, Pharm.D., BCPS AQ-ID []  , Pharm.D., BCPS []  Celedonio Miyamoto, .D., BCPS []  [place], .D., BCPS, AAHIVP []  Georgina Pillion, Pharm.D., BCPS, AAHIVP []  [address], PharmD, BCPS []  , PharmD, BCPS []  [place], PharmD, BCPS []  [place], PharmD []  , PharmD, BCPS []  Estella Husk, PharmD  Pharmacy Team []  Lysle Pearl, PharmD []  , PharmD []  Phillips Climes, PharmD []  , Rph []  Agapito Games) , PharmD []  Verlan Friends, PharmD []  , PharmD []  Mervyn Gay, PharmD []  , PharmD []  Vinnie Level, PharmD []  [facility], PharmD []  , PharmD []  Len Childs, PharmD   Positive urine culture Treated with Cefpodoxime Proxetil, organism sensitive to the same and no further patient follow-up is required at this time.  [facility] 02/21/2020, 9:00 AM

## 2020-02-23 ENCOUNTER — Encounter: Payer: Self-pay | Admitting: Family Medicine

## 2020-02-23 ENCOUNTER — Telehealth (INDEPENDENT_AMBULATORY_CARE_PROVIDER_SITE_OTHER): Payer: No Typology Code available for payment source | Admitting: Family Medicine

## 2020-02-23 VITALS — BP 147/89 | HR 93 | Ht 61.0 in

## 2020-02-23 DIAGNOSIS — M545 Low back pain, unspecified: Secondary | ICD-10-CM

## 2020-02-23 DIAGNOSIS — E876 Hypokalemia: Secondary | ICD-10-CM

## 2020-02-23 DIAGNOSIS — I10 Essential (primary) hypertension: Secondary | ICD-10-CM | POA: Diagnosis not present

## 2020-02-23 DIAGNOSIS — N390 Urinary tract infection, site not specified: Secondary | ICD-10-CM

## 2020-02-23 DIAGNOSIS — M25512 Pain in left shoulder: Secondary | ICD-10-CM

## 2020-02-23 MED ORDER — AMLODIPINE BESYLATE 5 MG PO TABS: 2.5000 mg | ORAL_TABLET | Freq: Every day | ORAL | 1 refills | Status: DC

## 2020-02-23 NOTE — Progress Notes (Signed)
Virtual Visit via Video Note I connected with Sandra Vang on 02/23/20 by a video enabled telemedicine application and verified that I am speaking with the correct person using two identifiers.  Location patient: home Location provider:work office Persons participating in the virtual visit: patient, provider  I discussed the limitations of evaluation and management by telemedicine and the availability of in person appointments. The patient expressed understanding and agreed to proceed.  HPI: Sandra Vang is a 44 yo female with hx of polyarthralgia, lupus,fibromyalgia,and HT following on recent ER visit. She was last seen on 07/14/19, virtual visit.  Presented to the ER on 02/17/20 c/o left low back and dysuria. Not sure about back pain exacerbating or alleviating factors. No Hx of nephrolithiasis.  She had be seen previously and started on Cipro. Negative for dysuria,gross hematuria,decreased urine output, vaginal discharge or bleeding.  She received Rocephin IV in the hospital and discharged on Cefpodoxine Proxetil 200 mg bid.. She has 7 more days of abx treatment, she is not longer having dysuria. Ucx grew E. Coli > 100,000 UFC.  Still having left low back pain.  Dysuria is not longer present. Denies fever,,chills,vomiting,or skin rash.  Constipation that improved after discontinuing pain medication 2 days ago, Hydrocodone-Acetaminophen. Last bowel moment today, no blood. Nausea, she is not sure if it was caused by opioid. LMP 02/16/20.  Also c/o  shoulder pain. She was in the ER on 02/20/20 c/o "excruciating" left shoulder pain. Triage was done and labs+imaging and EKG done. She left before been seen by provider.  Left shoulder X ray: 1. 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 2. No acute bony abnormality. She already saw with rheumatologist. Received shoulder steroid injection and started on Prednisone taper.  She is  concerned about troponin level, wonders if this needs to be re-checked. Troponin was 4 (normal range < 18). Negative for CP,SOB,palpitations, or diaphoresis.  02/17/20 BP was elevated, 163/85, she thinks it is related to pain. She was started on HCTZ 25 mg daily. BP in 120's/80's but checked during visit with different BP monitors: 180/91,147/89 Negative for unusual/severe headache or visual changes.  HypoK+: She is on K+ rich diet.  Lab Results  Component Value Date   CREATININE 0.61 02/19/2020   BUN 10 02/19/2020   NA 136 02/19/2020   K 3.2 (L) 02/19/2020   CL 99 02/19/2020   CO2 26 02/19/2020   Lab Results  Component Value Date   WBC 9.2 02/19/2020   HGB 12.4 02/19/2020   HCT 39.7 02/19/2020   MCV 87.4 02/19/2020   PLT 438 (H) 02/19/2020   ROS: See pertinent positives and negatives per HPI.  Past Medical History:  Diagnosis Date  . ADD (attention deficit disorder)   . B12 deficiency   . Chest pain   . Chronic headaches   . Depression   . Fibromyalgia   . Gallbladder disease   . Hypertension   . Insomnia   . Joint pain   . Lactose intolerance   . MRSA (methicillin resistant Staphylococcus aureus) infection   . OSA (obstructive sleep apnea) 03/24/2015  . Psoriatic arthritis (HCC)   . Systemic lupus erythematosus (HCC) 10/01/2016   -seeing dermatology and rheumatologist (Dr. Kathi Ludwig)  . Vitamin D deficiency     Past Surgical History:  Procedure Laterality Date  . APPENDECTOMY  2005  . CESAREAN SECTION    . TUBAL LIGATION  1998    Family History  Problem Relation Age of Onset  .  Alcohol abuse Mother   . Hypertension Mother   . High Cholesterol Mother   . Kidney disease Mother   . Anxiety disorder Mother   . Drug abuse Father   . Hypertension Father   . Emphysema Father   . High Cholesterol Father   . Anxiety disorder Father   . Depression Paternal Aunt   . Depression Paternal Uncle   . Alcohol abuse Maternal Grandmother   . Arthritis Maternal  Grandmother   . Cancer Maternal Grandmother   . Hypertension Maternal Grandmother   . Alcohol abuse Maternal Grandfather   . Hypertension Maternal Grandfather   . Arthritis Paternal Grandmother   . Cancer Paternal Grandmother   . Hypertension Paternal Grandmother   . Asthma Paternal Grandmother   . Hypertension Paternal Grandfather   . Asthma Brother   . Asthma Daughter   . Asthma Daughter     Social History   Socioeconomic History  . Marital status: Divorced    Spouse name: Not on file  . Number of children: Not on file  . Years of education: Not on file  . Highest education level: Not on file  Occupational History  . Occupation: Charity fundraiser  Tobacco Use  . Smoking status: Never Smoker  . Smokeless tobacco: Never Used  Vaping Use  . Vaping Use: Never used  Substance and Sexual Activity  . Alcohol use: No    Alcohol/week: 0.0 standard drinks  . Drug use: No  . Sexual activity: Not on file  Other Topics Concern  . 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 Difficulty of Paying Living Expenses: Not on file  Food Insecurity:   . Worried About Programme researcher, broadcasting/film/video in the Last Year: Not on file  . Ran Out of Food in the Last Year: Not on file  Transportation Needs:   . Lack of Transportation (Medical): Not on file  . Lack of Transportation (Non-Medical): Not on file  Physical Activity:   . Days of Exercise per Week: Not on file  . Minutes of Exercise per Session: Not on file  Stress:   . Feeling of Stress : Not on file  Social Connections:   . Frequency of Communication with Friends and Family: Not on file  . Frequency of Social Gatherings with Friends and Family: Not on file  . Attends Religious  Services: Not on file  . Active Member of Clubs or Organizations: Not on file  . Attends Banker Meetings: Not on file  . Marital Status: Not on file  Intimate Partner Violence:   . Fear of Current or Ex-Partner: Not on file  . Emotionally Abused: Not on file  . Physically Abused: Not on file  . Sexually Abused: Not on file    Current Outpatient Medications:  .  cefpodoxime (VANTIN) 200 MG tablet, Take 1 tablet (200 mg total) by mouth 2 (two) times daily for 14 days., Disp: 28 tablet, Rfl: 0 .  hydrochlorothiazide (HYDRODIURIL) 25 MG tablet, Take 1 tablet (25 mg total) by mouth daily., Disp: 90 tablet, Rfl: 1 .  OVER THE COUNTER MEDICATION, [place], Disp: , Rfl:  .  OVER THE COUNTER MEDICATION, Hibiscus, Disp: , Rfl:  .  OVER THE COUNTER MEDICATION, Turmeric 1500mg , Disp: , Rfl:  .  OVER THE COUNTER MEDICATION, Beet root as needed, Disp: , Rfl:  .  OVER THE COUNTER MEDICATION, Elderberry syrup with other herbs as needed, Disp: , Rfl:  .  amLODipine (NORVASC) 5 MG tablet, Take 0.5 tablets (2.5 mg total) by mouth daily., Disp: 30 tablet, Rfl: 1  EXAM:  VITALS per patient if applicable:BP (!) 147/89   Pulse 93   Ht 5\' 1"  (1.549 m)   LMP 02/15/2020 (Exact Date)   BMI 34.50 kg/m   GENERAL: alert, oriented, appears well and in no acute distress  HEENT: atraumatic, conjunctiva clear, no obvious abnormalities on inspection.  NECK: normal movements of the head and neck  LUNGS: on inspection no signs of respiratory distress, breathing rate appears normal, no obvious gross SOB, gasping or wheezing  CV: no obvious cyanosis  Sandra: moves all visible extremities without noticeable abnormality  PSYCH/NEURO: pleasant and cooperative, no obvious depression, + anxious.  ASSESSMENT AND PLAN:  Discussed the following assessment and plan:  Acute pain of left shoulder Following now with rheumatologist. Caution with prednisone taper, could affect BP.  Left-sided low back pain  without sciatica, unspecified chronicity We discussed other possible causes. ? Musculoskeletal pain. Monitor for new symptoms.  Urinary tract infection without hematuria, site unspecified Ucx E. Coli sensitive to all abx. Dysuria has improved. Last CBC with normalization of WBC's. Complete abx treatment. Adequate hydration.  Hypokalemia Mild. Continue K+ rich diet. F/U in 2 weeks.  Essential hypertension Problem is not well controlled. We discussed possible complications of elevated BP. She agrees with adding amlodipine 5 mg, she will start with 1/2 tablet at bedtime. Continue HCTZ 25 mg daily. Continue monitoring BP regularly and following low-salt diet. She was clearly instructed about warning signs. Follow-up in 2 weeks, before if needed.   In regard to troponin level, explained that it was in normal range.I do not think she needs to go back to the ER to have it repeated.  If any CP,SOB,palpitations she needs to seek immediate medication attention.  I discussed the assessment and treatment plan with the patient. Sandra [place] was provided an opportunity to ask questions and all were answered. She agreed with the plan and demonstrated an understanding of the instructions.   Return in about 3 weeks (around 03/15/2020) for 2-3 weeks HTN and labs..   Kamela Blansett Mayford Knife, MD

## 2020-02-23 NOTE — Assessment & Plan Note (Signed)
Problem is not well controlled. We discussed possible complications of elevated BP. She agrees with adding amlodipine 5 mg, she will start with 1/2 tablet at bedtime. Continue HCTZ 25 mg daily. Continue monitoring BP regularly and following low-salt diet. She was clearly instructed about warning signs. Follow-up in 2 weeks, before if needed.

## 2020-02-24 NOTE — Telephone Encounter (Signed)
Spoke with the pt and informed her of the message below.  I advised the pt go to an urgent care or ER as we do not have any openings here.  Patient stated she "is not going to an urgent care and thanks for calling".  Message sent to Dr Selena Batten.

## 2020-02-24 NOTE — Telephone Encounter (Signed)
Please call her.Sandra KitchenMarland KitchenI JUST got this message..please apologize for the delay and let her know I just got the message. Please advise inperson evaluation! UCC or ER if can not get in  at [place]. Thanks!

## 2020-02-25 ENCOUNTER — Inpatient Hospital Stay: Payer: No Typology Code available for payment source | Admitting: Family Medicine

## 2020-02-29 ENCOUNTER — Other Ambulatory Visit: Payer: Self-pay

## 2020-03-01 ENCOUNTER — Encounter: Payer: Self-pay | Admitting: Family Medicine

## 2020-03-01 ENCOUNTER — Ambulatory Visit (INDEPENDENT_AMBULATORY_CARE_PROVIDER_SITE_OTHER): Payer: No Typology Code available for payment source | Admitting: Family Medicine

## 2020-03-01 VITALS — BP 110/70 | HR 76 | Temp 98.1°F | Ht 61.0 in | Wt 181.0 lb

## 2020-03-01 DIAGNOSIS — E876 Hypokalemia: Secondary | ICD-10-CM

## 2020-03-01 DIAGNOSIS — R109 Unspecified abdominal pain: Secondary | ICD-10-CM

## 2020-03-01 NOTE — Patient Instructions (Signed)
Let me know in two weeks if flank pain not clearing.

## 2020-03-01 NOTE — Progress Notes (Signed)
Established Patient Office Visit  Subjective:  Patient ID: Sandra Vang, female    DOB: July 14, 1975  Age: 44 y.o. MRN: [id number]  CC:  Chief Complaint  Patient presents with  . Flank Pain    Pt present to follow up with Kidney function. Pt stated she is still having flank pain     HPI Sandra Vang presents for request to recheck "kidney functions ".  She had recent ER visit back in middle of August and was treated for pyelonephritis.  Her culture grew out E. coli.  She was given Rocephin and followed by the [facility] which she remains on.  She was treated for full 14-day course.  She has no burning with urination now.  She does have some mild left flank pain.  Recent renal function was stable with creatinine 0.6 August 21.  She did have mild hypokalemia with potassium 3.2.  She is eating lots of fruits.  She does have history of SLE and has concerns for her kidneys.  No increased edema.  No fever.  No nausea or vomiting.  Past Medical History:  Diagnosis Date  . ADD (attention deficit disorder)   . B12 deficiency   . Chest pain   . Chronic headaches   . Depression   . Fibromyalgia   . Gallbladder disease   . Hypertension   . Insomnia   . Joint pain   . Lactose intolerance   . MRSA (methicillin resistant Staphylococcus aureus) infection   . OSA (obstructive sleep apnea) 03/24/2015  . Psoriatic arthritis (HCC)   . Systemic lupus erythematosus (HCC) 10/01/2016   -seeing dermatology and rheumatologist (Dr. Kathi Ludwig)  . Vitamin D deficiency     Past Surgical History:  Procedure Laterality Date  . APPENDECTOMY  2005  . CESAREAN SECTION    . TUBAL LIGATION  1998    Family History  Problem Relation Age of Onset  . Alcohol abuse Mother   . Hypertension Mother   . High Cholesterol Mother   . Kidney disease Mother   . Anxiety disorder Mother   . Drug abuse Father   . Hypertension Father   . Emphysema Father   . High Cholesterol Father   . Anxiety disorder Father   .  Depression Paternal Aunt   . Depression Paternal Uncle   . Alcohol abuse Maternal Grandmother   . Arthritis Maternal Grandmother   . Cancer Maternal Grandmother   . Hypertension Maternal Grandmother   . Alcohol abuse Maternal Grandfather   . Hypertension Maternal Grandfather   . Arthritis Paternal Grandmother   . Cancer Paternal Grandmother   . Hypertension Paternal Grandmother   . Asthma Paternal Grandmother   . Hypertension Paternal Grandfather   . Asthma Brother   . Asthma Daughter   . Asthma Daughter     Social History   Socioeconomic History  . Marital status: Divorced    Spouse name: Not on file  . Number of children: Not on file  . Years of education: Not on file  . Highest education level: Not on file  Occupational History  . Occupation: Charity fundraiser  Tobacco Use  . Smoking status: Never Smoker  . Smokeless tobacco: Never Used  Vaping Use  . Vaping Use: Never used  Substance and Sexual Activity  . Alcohol use: No    Alcohol/week: 0.0 standard drinks  . Drug use: No  . Sexual activity: Not on file  Other Topics Concern  . 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 Difficulty of Paying Living Expenses: Not on file  Food Insecurity:   . Worried About Programme researcher, broadcasting/film/video in the Last Year: Not on file  . Ran Out of Food in the Last Year: Not on file  Transportation Needs:   . Lack of Transportation (Medical): Not on file  . Lack of Transportation (Non-Medical): Not on file  Physical Activity:   . Days of Exercise per Week: Not on file  . Minutes of Exercise per Session: Not on file  Stress:   . Feeling of Stress : Not on file  Social Connections:   . Frequency of Communication with Friends and  Family: Not on file  . Frequency of Social Gatherings with Friends and Family: Not on file  . Attends Religious Services: Not on file  . Active Member of Clubs or Organizations: Not on file  . Attends Banker Meetings: Not on file  . Marital Status: Not on file  Intimate Partner Violence:   . Fear of Current or Ex-Partner: Not on file  . Emotionally Abused: Not on file  . Physically Abused: Not on file  . Sexually Abused: Not on file    Outpatient Medications Prior to Visit  Medication Sig Dispense Refill  . amLODipine (NORVASC) 5 MG tablet Take 0.5 tablets (2.5 mg total) by mouth daily. 30 tablet 1  . cefpodoxime (VANTIN) 200 MG tablet Take 1 tablet (200 mg total) by mouth 2 (two) times daily for 14 days. 28 tablet 0  . hydrochlorothiazide (HYDRODIURIL) 25 MG tablet Take 1 tablet (25 mg total) by mouth daily. 90 tablet 1  . OVER THE COUNTER MEDICATION Hawthorne    . OVER THE COUNTER MEDICATION Hibiscus    . OVER THE COUNTER MEDICATION Turmeric 1500mg     . OVER THE COUNTER MEDICATION Beet root as needed    . OVER THE COUNTER MEDICATION Elderberry syrup with other herbs as needed     No facility-administered medications prior to visit.    Allergies  Allergen Reactions  . Iodinated Diagnostic Agents Anaphylaxis  . Dexamethasone     Severe vaginal and rectal burning  . Iohexol      Desc: HIVES   . Sulfa Antibiotics Hives  . Sulfasalazine Hives    ROS Review of Systems  Constitutional: Negative for chills and fever.  Respiratory: Negative for shortness of breath.   Cardiovascular: Negative for chest pain.  Gastrointestinal: Negative for nausea and vomiting.  Genitourinary: Positive for flank pain. Negative for dysuria and hematuria.      Objective:    Physical Exam Vitals reviewed.  Constitutional:      Appearance: Normal appearance.  Cardiovascular:     Rate and Rhythm: Normal rate and regular rhythm.  Pulmonary:     Effort: Pulmonary effort is  normal.     Breath sounds: Normal breath sounds.  Neurological:     Mental Status: She is alert.     BP 110/70   Pulse 76   Temp 98.1 F (36.7 C) (Oral)   Ht 5\' 1"  (1.549 m)   Wt 181 lb (82.1 kg)   LMP 02/15/2020 (Exact Date)  SpO2 98%   BMI 34.20 kg/m  Wt Readings from Last 3 Encounters:  03/01/20 181 lb (82.1 kg)  02/17/20 182 lb 9.6 oz (82.8 kg)  02/17/20 177 lb (80.3 kg)     Health Maintenance Due  Topic Date Due  . Hepatitis C Screening  Never done  . PAP SMEAR-Modifier  07/01/2018  . INFLUENZA VACCINE  01/30/2020    There are no preventive care reminders to display for this patient.  Lab Results  Component Value Date   TSH 1.970 07/07/2019   Lab Results  Component Value Date   WBC 9.2 02/19/2020   HGB 12.4 02/19/2020   HCT 39.7 02/19/2020   MCV 87.4 02/19/2020   PLT 438 (H) 02/19/2020   Lab Results  Component Value Date   NA 136 02/19/2020   K 3.2 (L) 02/19/2020   CO2 26 02/19/2020   GLUCOSE 107 (H) 02/19/2020   BUN 10 02/19/2020   CREATININE 0.61 02/19/2020   BILITOT <0.2 07/07/2019   ALKPHOS 98 07/07/2019   AST 18 07/07/2019   ALT 9 07/07/2019   PROT 7.6 07/07/2019   ALBUMIN 4.8 07/07/2019   CALCIUM 9.1 02/19/2020   ANIONGAP 11 02/19/2020   GFR 106.94 10/22/2018   Lab Results  Component Value Date   CHOL 223 (H) 07/07/2019   Lab Results  Component Value Date   HDL 81 07/07/2019   Lab Results  Component Value Date   LDLCALC 128 (H) 07/07/2019   Lab Results  Component Value Date   TRIG 81 07/07/2019   Lab Results  Component Value Date   CHOLHDL 3 10/01/2016   Lab Results  Component Value Date   HGBA1C 5.5 07/07/2019      Assessment & Plan:   #1 recent UTI with E. coli.  She was treated for pyelonephritis and is currently finishing 14-day course of Vantin  -Recheck basic metabolic panel per patient request. -Touch base if flank pain not resolving next couple weeks and follow-up immediately she has any fever after  stopping antibiotic  #2 recent mild hypokalemia with potassium 3.2 -Recheck basic metabolic panel  No orders of the defined types were placed in this encounter.   Follow-up: No follow-ups on file.    Evelena Peat, MD

## 2020-03-02 LAB — BASIC METABOLIC PANEL
BUN: 11 mg/dL (ref 7–25)
CO2: 27 mmol/L (ref 20–32)
Calcium: 9 mg/dL (ref 8.6–10.2)
Chloride: 103 mmol/L (ref 98–110)
Creat: 0.69 mg/dL (ref 0.50–1.10)
Glucose, Bld: 96 mg/dL (ref 65–99)
Potassium: 4.6 mmol/L (ref 3.5–5.3)
Sodium: 137 mmol/L (ref 135–146)

## 2020-03-08 ENCOUNTER — Ambulatory Visit (INDEPENDENT_AMBULATORY_CARE_PROVIDER_SITE_OTHER): Payer: No Typology Code available for payment source | Admitting: Professional

## 2020-03-08 DIAGNOSIS — F4321 Adjustment disorder with depressed mood: Secondary | ICD-10-CM

## 2020-03-13 ENCOUNTER — Ambulatory Visit (INDEPENDENT_AMBULATORY_CARE_PROVIDER_SITE_OTHER): Payer: No Typology Code available for payment source | Admitting: Professional

## 2020-03-13 DIAGNOSIS — F4321 Adjustment disorder with depressed mood: Secondary | ICD-10-CM

## 2020-03-22 ENCOUNTER — Ambulatory Visit (INDEPENDENT_AMBULATORY_CARE_PROVIDER_SITE_OTHER): Payer: No Typology Code available for payment source | Admitting: Professional

## 2020-03-22 DIAGNOSIS — F4321 Adjustment disorder with depressed mood: Secondary | ICD-10-CM

## 2020-03-28 ENCOUNTER — Ambulatory Visit: Payer: No Typology Code available for payment source | Admitting: Professional

## 2020-04-12 DIAGNOSIS — L930 Discoid lupus erythematosus: Secondary | ICD-10-CM | POA: Insufficient documentation

## 2020-04-25 ENCOUNTER — Encounter: Payer: Self-pay | Admitting: Family Medicine

## 2020-05-16 ENCOUNTER — Other Ambulatory Visit: Payer: Self-pay | Admitting: Family Medicine

## 2020-07-12 ENCOUNTER — Other Ambulatory Visit: Payer: No Typology Code available for payment source

## 2020-07-12 DIAGNOSIS — Z20822 Contact with and (suspected) exposure to covid-19: Secondary | ICD-10-CM

## 2020-07-13 LAB — NOVEL CORONAVIRUS, NAA: SARS-CoV-2, NAA: NOT DETECTED

## 2020-07-13 LAB — SARS-COV-2, NAA 2 DAY TAT

## 2020-07-18 ENCOUNTER — Telehealth: Payer: No Typology Code available for payment source | Admitting: Nurse Practitioner

## 2020-07-18 DIAGNOSIS — J00 Acute nasopharyngitis [common cold]: Secondary | ICD-10-CM

## 2020-07-18 MED ORDER — BENZONATATE 100 MG PO CAPS: 100.0000 mg | ORAL_CAPSULE | Freq: Three times a day (TID) | ORAL | 0 refills | Status: DC | PRN

## 2020-07-18 MED ORDER — AZELASTINE HCL 0.1 % NA SOLN: 2.0000 | Freq: Two times a day (BID) | NASAL | 12 refills | Status: DC

## 2020-07-18 NOTE — Progress Notes (Signed)
We are sorry you are not feeling well.  Here is how we plan to help!  Based on what you have shared with me, it looks like you may have a viral upper respiratory infection.  Upper respiratory infections are caused by a large number of viruses; however, rhinovirus is the most common cause.   Symptoms vary from person to person, with common symptoms including sore throat, cough, fatigue or lack of energy and feeling of general discomfort.  A low-grade fever of up to 100.4 may present, but is often uncommon.  Symptoms vary however, and are closely related to a person's age or underlying illnesses.  The most common symptoms associated with an upper respiratory infection are nasal discharge or congestion, cough, sneezing, headache and pressure in the ears and face.  These symptoms usually persist for about 3 to 10 days, but can last up to 2 weeks.  It is important to know that upper respiratory infections do not cause serious illness or complications in most cases.    Upper respiratory infections can be transmitted from person to person, with the most common method of transmission being a person's hands.  The virus is able to live on the skin and can infect other persons for up to 2 hours after direct contact.  Also, these can be transmitted when someone coughs or sneezes; thus, it is important to cover the mouth to reduce this risk.  To keep the spread of the illness at [place], good hand hygiene is very important.  This is an infection that is most likely caused by a virus. There are no specific treatments other than to help you with the symptoms until the infection runs its course.  We are sorry you are not feeling well.  Here is how we plan to help!   For nasal congestion, you may use an oral decongestants such as Mucinex D or if you have glaucoma or high blood pressure use plain Mucinex.  Saline nasal spray or nasal drops can help and can safely be used as often as needed for congestion.  For your congestion,  I have prescribed Azelastine nasal spray two sprays in each nostril twice a day  If you do not have a history of heart disease, hypertension, diabetes or thyroid disease, prostate/bladder issues or glaucoma, you may also use Sudafed to treat nasal congestion.  It is highly recommended that you consult with a pharmacist or your primary care physician to ensure this medication is safe for you to take.     If you have a cough, you may use cough suppressants such as Delsym and Robitussin.  If you have glaucoma or high blood pressure, you can also use Coricidin HBP.   For cough I have prescribed for you A prescription cough medication called Tessalon Perles 100 mg. You may take 1-2 capsules every 8 hours as needed for cough  If you have a sore or scratchy throat, use a saltwater gargle-  to  teaspoon of salt dissolved in a 4-ounce to 8-ounce glass of warm water.  Gargle the solution for approximately 15-30 seconds and then spit.  It is important not to swallow the solution.  You can also use throat lozenges/cough drops and Chloraseptic spray to help with throat pain or discomfort.  Warm or cold liquids can also be helpful in relieving throat pain.  For headache, pain or general discomfort, you can use Ibuprofen or Tylenol as directed.   Some authorities believe that zinc sprays or the use of  Echinacea may shorten the course of your symptoms.   HOME CARE . Only take medications as instructed by your medical team. . Be sure to drink plenty of fluids. Water is fine as well as fruit juices, sodas and electrolyte beverages. You may want to stay away from caffeine or alcohol. If you are nauseated, try taking small sips of liquids. How do you know if you are getting enough fluid? Your urine should be a pale yellow or almost colorless. . Get rest. . Taking a steamy shower or using a humidifier may help nasal congestion and ease sore throat pain. You can place a towel over your head and breathe in the steam  from hot water coming from a faucet. . Using a saline nasal spray works much the same way. . Cough drops, hard candies and sore throat lozenges may ease your cough. . Avoid close contacts especially the very young and the elderly . Cover your mouth if you cough or sneeze . Always remember to wash your hands.   GET HELP RIGHT AWAY IF: . You develop worsening fever. . If your symptoms do not improve within 10 days . You develop yellow or green discharge from your nose over 3 days. . You have coughing fits . You develop a severe head ache or visual changes. . You develop shortness of breath, difficulty breathing or start having chest pain . Your symptoms persist after you have completed your treatment plan  MAKE SURE YOU   Understand these instructions.  Will watch your condition.  Will get help right away if you are not doing well or get worse.  Your e-visit answers were reviewed by a board certified advanced clinical practitioner to complete your personal care plan. Depending upon the condition, your plan could have included both over the counter or prescription medications. Please review your pharmacy choice. If there is a problem, you may call our nursing hot line at and have the prescription routed to another pharmacy. Your safety is important to us. If you have drug allergies check your prescription carefully.   You can use MyChart to ask questions about today's visit, request a non-urgent call back, or ask for a work or school excuse for 24 hours related to this e-Visit. If it has been greater than 24 hours you will need to follow up with your provider, or enter a new e-Visit to address those concerns. You will get an e-mail in the next two days asking about your experience.  I hope that your e-visit has been valuable and will speed your recovery. Thank you for using e-visits.   5-10 minutes spent reviewing and documenting in chart.    

## 2020-09-19 ENCOUNTER — Other Ambulatory Visit: Payer: Self-pay

## 2020-09-19 ENCOUNTER — Ambulatory Visit (INDEPENDENT_AMBULATORY_CARE_PROVIDER_SITE_OTHER): Payer: No Typology Code available for payment source | Admitting: Family Medicine

## 2020-09-19 ENCOUNTER — Encounter: Payer: Self-pay | Admitting: Family Medicine

## 2020-09-19 VITALS — BP 140/90 | HR 84 | Temp 98.7°F | Wt 184.0 lb

## 2020-09-19 DIAGNOSIS — M329 Systemic lupus erythematosus, unspecified: Secondary | ICD-10-CM | POA: Diagnosis not present

## 2020-09-19 DIAGNOSIS — M2550 Pain in unspecified joint: Secondary | ICD-10-CM | POA: Diagnosis not present

## 2020-09-19 DIAGNOSIS — I10 Essential (primary) hypertension: Secondary | ICD-10-CM | POA: Diagnosis not present

## 2020-09-19 MED ORDER — LISINOPRIL 10 MG PO TABS: 10.0000 mg | ORAL_TABLET | Freq: Every day | ORAL | 1 refills | Status: DC

## 2020-09-19 NOTE — Progress Notes (Signed)
° °  Subjective:    Patient ID: Sandra Vang, female    DOB: [birth year], 45 y.o.   MRN: [id number]  HPI Here for several weeks of not feeling well , including lightheadedness, fatigue, and blurred vision. Her BP has been elevated at home, often in the range of 150/100. She has a hx of HTN and she has been very reluctant to take prescription medications for it. She has tried to manage it with herbal OTC products like Hawthorne, hibiscus, and garlic, but these have not worked very well. She was recently put on Amlodipine by Dr. [place], her PCP, and she has increased this from 2.5 to 5 mg daily. She also complains of diffuse joint pains, and she has been diagnosed with inflammatory arthritis. She sees Dr. Haydee Salter for rheumatology care, and she has also been diagnosed with SLE and borderline Sjogren's syndrome. She is reluctant to take any medication for the joint pains.    Review of Systems  Constitutional: Positive for fatigue. Negative for chills, diaphoresis and fever.  Eyes: Positive for visual disturbance.  Respiratory: Negative.   Cardiovascular: Negative.   Gastrointestinal: Negative.   Endocrine: Negative.   Musculoskeletal: Positive for arthralgias.  Neurological: Positive for light-headedness.       Objective:   Physical Exam Constitutional:      Appearance: Normal appearance. She is not ill-appearing.  Cardiovascular:     Rate and Rhythm: Normal rate and regular rhythm.     Pulses: Normal pulses.     Heart sounds: Normal heart sounds.  Pulmonary:     Effort: Pulmonary effort is normal.     Breath sounds: Normal breath sounds.  Musculoskeletal:     Right lower leg: No edema.     Left lower leg: No edema.  Neurological:     Mental Status: She is alert.           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 She can take Tylenol for the joint pains. I advised her to see Dr. Kathi Ludwig again soon. I think her  fatigue and lightheadedness is the result of poorly controlled HTN. We will add Lisinopril 10 mg daily to the Amlodipine. Recheck with Dr. Sw[place]and in 3-4 weeks. Check a BMET today. Gershon Crane, MD

## 2020-09-20 LAB — BASIC METABOLIC PANEL
BUN: 11 mg/dL (ref 6–23)
CO2: 27 mEq/L (ref 19–32)
Calcium: 9.5 mg/dL (ref 8.4–10.5)
Chloride: 102 mEq/L (ref 96–112)
Creatinine, Ser: 0.82 mg/dL (ref 0.40–1.20)
GFR: 86.66 mL/min (ref >60.00)
Glucose, Bld: 88 mg/dL (ref 70–99)
Potassium: 4 mEq/L (ref 3.5–5.1)
Sodium: 139 mEq/L (ref 135–145)

## 2020-09-26 ENCOUNTER — Encounter: Payer: Self-pay | Admitting: Family Medicine

## 2020-09-27 ENCOUNTER — Encounter: Payer: Self-pay | Admitting: Family Medicine

## 2020-09-27 MED ORDER — AMLODIPINE BESYLATE 10 MG PO TABS: 10.0000 mg | ORAL_TABLET | Freq: Every day | ORAL | 0 refills | Status: DC

## 2020-09-27 NOTE — Telephone Encounter (Signed)
I have taken Lisinopril off her list. I also increased the Amlodipine to 10 mg daily. She should follow up with Dr. Swaziland in several weeks

## 2020-09-27 NOTE — Telephone Encounter (Signed)
Let's see if the higher dose of Amlodipine helps with these. If not, see Dr. Sw[place]iland about them

## 2020-10-10 ENCOUNTER — Encounter: Payer: Self-pay | Admitting: Family Medicine

## 2020-10-11 ENCOUNTER — Other Ambulatory Visit: Payer: Self-pay | Admitting: Family Medicine

## 2020-12-18 ENCOUNTER — Ambulatory Visit: Payer: No Typology Code available for payment source | Admitting: Family Medicine

## 2020-12-18 DIAGNOSIS — Z0289 Encounter for other administrative examinations: Secondary | ICD-10-CM

## 2020-12-19 ENCOUNTER — Other Ambulatory Visit: Payer: Self-pay

## 2020-12-19 ENCOUNTER — Telehealth: Payer: No Typology Code available for payment source | Admitting: Family Medicine

## 2020-12-29 ENCOUNTER — Other Ambulatory Visit: Payer: Self-pay | Admitting: Family Medicine

## 2021-01-05 NOTE — Progress Notes (Signed)
Advanced Hypertension Clinic Initial Assessment:    Date:  01/08/2021   ID:  Sandra Vang, DOB [birth year], MRN [id number]  PCP:  [place], Betty G, MD  Cardiologist:  None  Nephrologist:  Referring MD: Maxie Better, MD   CC: Hypertension  History of Present Illness:    Sandra Vang is a 45 y.o. female with a hx of Hypertension, ADD, depression, Psoriatic arthritis, OSA, Systemic lupus erythematosus,  and fibromyalgia here to establish care in the Advanced Hypertension Clinic. She was last seen at [facility] 12/19/2020 for viral illness. At the time, she was on lisinopril and amlodipine. Her blood pressure was 175/48.  She saw Dr. Cherly Hensen on 09/2020 and her blood pressure was 164/100 so she was referred to advanced hypertension clinic.  Today, overall she is feeling okay. She believes has been diagnosed with hypertension in  2016.  She reports having a headache this morning. Her BP ranges at home are 110s-130s systolic/60s-80s diastolic.  She notes that her blood pressure is higher when she is in pain.  She recently has has an injection in her shoulders for her chronic pain around a month ago. She is currently on amlodipine. She is unsure why she stopped taking lisinopril.  She does not get much exercise.  She works from home and sits for more than 12 hours. She wants to regulate exercising but tends to be tired from working. Before COVID,she use to attend boot camp, but since then her motivation has change, so she stopped. She orders food more than cooking. She adds a lot of spices in her meals, which helps her limit salt.  Lately, she has been eating more salads. She drinks coffee in the morning. S She does know she has obstructive sleep apnea. Pt knows she needs another sleep study. She has not  been on a CPAP machine for 8 to 10 years.  Patient denies chest pain, shortness of breath, abdominal pain, diarrhea, palpations, nausea, lightheadedness syncope,orthopnea or  PND.  Previous antihypertensives: HCTZ- hypokalemia   Past Medical History:  Diagnosis Date   ADD (attention deficit disorder)    B12 deficiency    Chest pain    Chronic headaches    Depression    Fibromyalgia    Gallbladder disease    Hypertension    Insomnia    Joint pain    Lactose intolerance    MRSA (methicillin resistant Staphylococcus aureus) infection    OSA (obstructive sleep apnea) 03/24/2015   Psoriatic arthritis (HCC)    Systemic lupus erythematosus (HCC) 10/01/2016   -seeing dermatology and rheumatologist (Dr. Kathi Ludwig)   Vitamin D deficiency     Past Surgical History:  Procedure Laterality Date   APPENDECTOMY  2005   CESAREAN SECTION     TUBAL LIGATION  1998    Current Medications: Current Meds  Medication Sig   amLODipine (NORVASC) 10 MG tablet TAKE 1 TABLET BY MOUTH EVERY DAY   OVER THE COUNTER MEDICATION [place]   OVER THE COUNTER MEDICATION Hibiscus   OVER THE COUNTER MEDICATION Turmeric 1500mg    OVER THE COUNTER MEDICATION Elderberry syrup with other herbs as needed   [DISCONTINUED] azelastine (ASTELIN) 0.1 % nasal spray Place 2 sprays into both nostrils 2 (two) times daily. Use in each nostril as directed   [DISCONTINUED] benzonatate (TESSALON PERLES) 100 MG capsule Take 1 capsule (100 mg total) by mouth 3 (three) times daily as needed.   [DISCONTINUED] OVER THE COUNTER MEDICATION Beet root as needed     Allergies:  Iodinated diagnostic agents, Dexamethasone, Iohexol, Sulfa antibiotics, and Sulfasalazine   Social History   Socioeconomic History   Marital status: Divorced    Spouse name: Not on file   Number of children: Not on file   Years of education: Not on file   Highest education level: Not on file  Occupational History   Occupation: RN  Tobacco Use   Smoking status: Never   Smokeless tobacco: Never  Vaping Use   Vaping Use: Never used  Substance and Sexual Activity   Alcohol use: No    Alcohol/week: 0.0 standard drinks   Drug  use: No   Sexual activity: Not on file  Other Topics Concern   Not on file  Social History Narrative   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, Community education officer      Home Situation: lives with her son and daughter      Spiritual Beliefs: Christian      Lifestyle: Curves, some exercise and trying to work on diet      Social Determinants of Health   Financial Resource Strain: Low Risk    Difficulty of Paying Living Expenses: Not hard at all  Food Insecurity: No Food Insecurity   Worried About Programme researcher, broadcasting/film/video in the Last Year: Never true   Barista in the Last Year: Never true  Transportation Needs: No Transportation Needs   Lack of Transportation (Medical): No   Lack of Transportation (Non-Medical): No  Physical Activity: Inactive   Days of Exercise per Week: 0 days   Minutes of Exercise per Session: 0 min  Stress: No Stress Concern Present   Feeling of Stress : Only a little  Social Connections: Moderately Integrated   Frequency of Communication with Friends and Family: More than three times a week   Frequency of Social Gatherings with Friends and Family: More than three times a week   Attends Religious Services: More than 4 times per year   Active Member of [employer] or Organizations: Not on file   Attends Banker Meetings: 1 to 4 times per year   Marital Status: Divorced     Family History: The patient's family history includes Alcohol abuse in her maternal grandfather, maternal grandmother, and mother; Anxiety disorder in her father and mother; Arthritis in her maternal grandmother and paternal grandmother; Asthma in her brother, daughter, daughter, and paternal grandmother; Cancer in her maternal grandmother and paternal grandmother; Depression in her paternal aunt and paternal uncle; Drug abuse in her father; Emphysema in her father; Heart failure in her maternal grandmother and paternal grandmother; High Cholesterol in her father and mother; Hypertension in her  father, maternal grandfather, maternal grandmother, mother, paternal grandfather, and paternal grandmother; Kidney disease in her mother; Multiple sclerosis in her brother.  ROS:   Please see the history of present illness.    (+) headache (+)fatigue (+)stress (+) myalgia (+)snores (+) bilateral Le, pitting edema All other systems reviewed and are negative.  EKGs/Labs/Other Studies Reviewed:    No prior CV studies available.  EKG:   01/08/2021: sinus rhythm, rate, 76 bpm  Recent Labs: 02/19/2020: Hemoglobin 12.4; Platelets 438 09/19/2020: BUN 11; Creatinine, Ser 0.82; Potassium 4.0; Sodium 139   Recent Lipid Panel    Component Value Date/Time   CHOL 223 (H) 07/07/2019 1256   TRIG 81 07/07/2019 1256   HDL 81 07/07/2019 1256   CHOLHDL 3 10/01/2016 1214   VLDL 9.0 10/01/2016 1214   LDLCALC 128 (H) 07/07/2019 1256  Physical Exam:   VS:  BP 122/68 (BP Location: Left Arm, Patient Position: Sitting)   Pulse 76   Ht 5\' 1"  (1.549 m)   Wt 180 lb (81.6 kg)   BMI 34.01 kg/m  , BMI Body mass index is 34.01 kg/m. GENERAL:  Well appearing HEENT: Pupils equal round and reactive, fundi not visualized, oral mucosa unremarkable NECK:  No jugular venous distention, waveform within normal limits, carotid upstroke brisk and symmetric, no bruits LUNGS:  Clear to auscultation bilaterally HEART:  RRR.  PMI not displaced or sustained,S1 and S2 within normal limits, no S3, no S4, no clicks, no rubs, no murmurs ABD:  Flat, positive bowel sounds normal in frequency in pitch, no bruits, no rebound, no guarding, no midline pulsatile mass, no hepatomegaly, no splenomegaly EXT:  2 plus pulses throughout, no edema, no cyanosis no clubbing SKIN:  No rashes no nodules NEURO:  Cranial nerves II through XII grossly intact, motor grossly intact throughout PSYCH:  Cognitively intact, oriented to person place and time   ASSESSMENT/PLAN:    Essential hypertension Blood pressure was initially slightly  above goal but better on repeat.  Therefore we will not make any changes.  It has generally been well have been controlled at home.  Continue amlodipine.  We did discuss lifestyle modification increase including increasing her exercise to at least 150 minutes weekly and continue to limit sodium intake.  She may be interested in the PREP exercise program [facility].  We will have them contact her.  She also has known OSA and has not been on a CPAP for several years.  We will refer her for a sleep study.  We will also refer her for stress management with our care guide.  OSA (obstructive sleep apnea) Patient snores and has known OSA.  Refer for sleep study.  Class 1 obesity with serious comorbidity and body mass index (BMI) of 34.0 to 34.9 in adult Encouraged her to increase her exercise to at least 150 minutes weekly.   Screening for Secondary Hypertension:  Causes 01/08/2021  Drugs/Herbals (No Data)     - Comments 1 coffee in AM, salt in eating out  Sleep Apnea Screened  Thyroid Disease Screened  Hyperaldosteronism Not Screened  Pheochromocytoma Not Screened  Cushing's Syndrome Not Screened    Relevant Labs/Studies: Basic Labs Latest Ref Rng & Units 09/19/2020 03/01/2020 02/19/2020  Sodium 135 - 145 mEq/L 139 137 136  Potassium 3.5 - 5.1 mEq/L 4.0 4.6 3.2(L)  Creatinine 0.40 - 1.20 mg/dL 4.090.82 8.110.69 9.140.61    Thyroid  Latest Ref Rng & Units 07/07/2019 06/16/2015  TSH 0.450 - 4.500 uIU/mL 1.970 1.833                  Disposition:    FU with MD/PharmD in 4 months    Medication Adjustments/Labs and Tests Ordered: Current medicines are reviewed at length with the patient today.  Concerns regarding medicines are outlined above.  Orders Placed This Encounter  Procedures   EKG 12-Lead   Split night study   No orders of the defined types were placed in this encounter.   I,Jada Bradford,acting as a Neurosurgeonscribe for Chilton Siiffany Newville, MD.,have documented all relevant documentation on the  behalf of Chilton Siiffany Cache, MD,as directed by  Chilton Siiffany Animas, MD while in the presence of Chilton Siiffany Weir, MD.  I, Jdyn Parkerson C. Duke Salviaandolph, MD have reviewed all documentation for this visit.  The documentation of the exam, diagnosis, procedures, and orders on 01/08/2021 are all  accurate and complete.   Signed, Chilton Si, MD  01/08/2021 1:03 PM    [facility]

## 2021-01-08 ENCOUNTER — Ambulatory Visit (INDEPENDENT_AMBULATORY_CARE_PROVIDER_SITE_OTHER): Payer: No Typology Code available for payment source | Admitting: Cardiovascular Disease

## 2021-01-08 ENCOUNTER — Other Ambulatory Visit: Payer: Self-pay | Admitting: *Deleted

## 2021-01-08 ENCOUNTER — Other Ambulatory Visit: Payer: Self-pay

## 2021-01-08 ENCOUNTER — Encounter (HOSPITAL_BASED_OUTPATIENT_CLINIC_OR_DEPARTMENT_OTHER): Payer: Self-pay | Admitting: Cardiovascular Disease

## 2021-01-08 VITALS — BP 122/68 | HR 76 | Ht 61.0 in | Wt 180.0 lb

## 2021-01-08 DIAGNOSIS — I10 Essential (primary) hypertension: Secondary | ICD-10-CM | POA: Diagnosis not present

## 2021-01-08 DIAGNOSIS — Z6834 Body mass index (BMI) 34.0-34.9, adult: Secondary | ICD-10-CM | POA: Diagnosis not present

## 2021-01-08 DIAGNOSIS — G4730 Sleep apnea, unspecified: Secondary | ICD-10-CM

## 2021-01-08 DIAGNOSIS — E669 Obesity, unspecified: Secondary | ICD-10-CM | POA: Diagnosis not present

## 2021-01-08 NOTE — Assessment & Plan Note (Signed)
Encouraged her to increase her exercise to at least 150 minutes weekly.

## 2021-01-08 NOTE — Progress Notes (Signed)
Amb ref

## 2021-01-08 NOTE — Patient Instructions (Addendum)
Medication Instructions:  Your physician recommends that you continue on your current medications as directed. Please refer to the Current Medication list given to you today.    Labwork: NONE    Testing/Procedures: Your physician has recommended that you have a sleep study. This test records several body functions during sleep, including: brain activity, eye movement, oxygen and carbon dioxide blood levels, heart rate and rhythm, breathing rate and rhythm, the flow of air through your mouth and nose, snoring, body muscle movements, and chest and belly movement. ONCE INSURANCE HAS BEEN REVIEWED THE OFFICE WILL CALL YOU WITH APPOINTMENT. IF YOU DO NOT HEAR IN 2 WEEKS CALL OFFICE TO FOLLOW UP    Follow-Up: 05/15/2021 at 9:30 am with Dr Fara Chute will receive a phone call from the PREP exercise and nutrition program to schedule an initial assessment.   Special Instructions:    DASH Eating Plan DASH stands for "Dietary Approaches to Stop Hypertension." The DASH eating plan is a healthy eating plan that has been shown to reduce high blood pressure (hypertension). It may also reduce your risk for type 2 diabetes, heart disease, and stroke. The DASH eating plan may also help with weight loss. What are tips for following this plan?  General guidelines Avoid eating more than 2,300 mg (milligrams) of salt (sodium) a day. If you have hypertension, you may need to reduce your sodium intake to 1,500 mg a day. Limit alcohol intake to no more than 1 drink a day for nonpregnant women and 2 drinks a day for men. One drink equals 12 oz of beer, 5 oz of wine, or 1 oz of hard liquor. Work with your health care provider to maintain a healthy body weight or to lose weight. Ask what an ideal weight is for you. Get at least 30 minutes of exercise that causes your heart to beat faster (aerobic exercise) most days of the week. Activities may include walking, swimming, or biking. Work with your health care  provider or diet and nutrition specialist (dietitian) to adjust your eating plan to your individual calorie needs. Reading food labels  Check food labels for the amount of sodium per serving. Choose foods with less than 5 percent of the Daily Value of sodium. Generally, foods with less than 300 mg of sodium per serving fit into this eating plan. To find whole grains, look for the word "whole" as the first word in the ingredient list. Shopping Buy products labeled as "low-sodium" or "no salt added." Buy fresh foods. Avoid canned foods and premade or frozen meals. Cooking Avoid adding salt when cooking. Use salt-free seasonings or herbs instead of table salt or sea salt. Check with your health care provider or pharmacist before using salt substitutes. Do not fry foods. Cook foods using healthy methods such as baking, boiling, grilling, and broiling instead. Cook with heart-healthy oils, such as olive, canola, soybean, or sunflower oil. Meal planning Eat a balanced diet that includes: 5 or more servings of fruits and vegetables each day. At each meal, try to fill half of your plate with fruits and vegetables. Up to 6-8 servings of whole grains each day. Less than 6 oz of lean meat, poultry, or fish each day. A 3-oz serving of meat is about the same size as a deck of cards. One egg equals 1 oz. 2 servings of low-fat dairy each day. A serving of nuts, seeds, or beans 5 times each week. Heart-healthy fats. Healthy fats called Omega-3 fatty acids are  found in foods such as flaxseeds and coldwater fish, like sardines, salmon, and mackerel. Limit how much you eat of the following: Canned or prepackaged foods. Food that is high in trans fat, such as fried foods. Food that is high in saturated fat, such as fatty meat. Sweets, desserts, sugary drinks, and other foods with added sugar. Full-fat dairy products. Do not salt foods before eating. Try to eat at least 2 vegetarian meals each week. Eat more  home-cooked food and less restaurant, buffet, and fast food. When eating at a restaurant, ask that your food be prepared with less salt or no salt, if possible. What foods are recommended? The items listed may not be a complete list. Talk with your dietitian about what dietary choices are best for you. Grains Whole-grain or whole-wheat bread. Whole-grain or whole-wheat pasta. Brown rice. Orpah Cobb. Bulgur. Whole-grain and low-sodium cereals. Pita bread. Low-fat, low-sodium crackers. Whole-wheat flour tortillas. Vegetables Fresh or frozen vegetables (raw, steamed, roasted, or grilled). Low-sodium or reduced-sodium tomato and vegetable juice. Low-sodium or reduced-sodium tomato sauce and tomato paste. Low-sodium or reduced-sodium canned vegetables. Fruits All fresh, dried, or frozen fruit. Canned fruit in natural juice (without added sugar). Meat and other protein foods Skinless chicken or [place]. Ground chicken or [place]. Pork with fat trimmed off. Fish and seafood. Egg whites. Dried beans, peas, or lentils. Unsalted nuts, nut butters, and seeds. Unsalted canned beans. Lean cuts of beef with fat trimmed off. Low-sodium, lean deli meat. Dairy Low-fat (1%) or fat-free (skim) milk. Fat-free, low-fat, or reduced-fat cheeses. Nonfat, low-sodium ricotta or cottage cheese. Low-fat or nonfat yogurt. Low-fat, low-sodium cheese. Fats and oils Soft margarine without trans fats. Vegetable oil. Low-fat, reduced-fat, or light mayonnaise and salad dressings (reduced-sodium). Canola, safflower, olive, soybean, and sunflower oils. Avocado. Seasoning and other foods Herbs. Spices. Seasoning mixes without salt. Unsalted popcorn and pretzels. Fat-free sweets. What foods are not recommended? The items listed may not be a complete list. Talk with your dietitian about what dietary choices are best for you. Grains Baked goods made with fat, such as croissants, muffins, or some breads. Dry pasta or rice meal  packs. Vegetables Creamed or fried vegetables. Vegetables in a cheese sauce. Regular canned vegetables (not low-sodium or reduced-sodium). Regular canned tomato sauce and paste (not low-sodium or reduced-sodium). Regular tomato and vegetable juice (not low-sodium or reduced-sodium). Rosita Fire. Olives. Fruits Canned fruit in a light or heavy syrup. Fried fruit. Fruit in cream or butter sauce. Meat and other protein foods Fatty cuts of meat. Ribs. Fried meat. Tomasa Blase. Sausage. Bologna and other processed lunch meats. Salami. Fatback. Hotdogs. Bratwurst. Salted nuts and seeds. Canned beans with added salt. Canned or smoked fish. Whole eggs or egg yolks. Chicken or [place] with skin. Dairy Whole or 2% milk, cream, and half-and-half. Whole or full-fat cream cheese. Whole-fat or sweetened yogurt. Full-fat cheese. Nondairy creamers. Whipped toppings. Processed cheese and cheese spreads. Fats and oils Butter. Stick margarine. Lard. Shortening. Ghee. Bacon fat. Tropical oils, such as coconut, palm kernel, or palm oil. Seasoning and other foods Salted popcorn and pretzels. Onion salt, garlic salt, seasoned salt, table salt, and sea salt. Worcestershire sauce. Tartar sauce. Barbecue sauce. Teriyaki sauce. Soy sauce, including reduced-sodium. Steak sauce. Canned and packaged gravies. Fish sauce. Oyster sauce. Cocktail sauce. Horseradish that you find on the shelf. Ketchup. Mustard. Meat flavorings and tenderizers. Bouillon cubes. Hot sauce and Tabasco sauce. Premade or packaged marinades. Premade or packaged taco seasonings. Relishes. Regular salad dressings. Where to find more information: [employer]  Heart, Lung, and Blood Institute: [employer]is American Heart Association: www.heart.org Summary The DASH eating plan is a healthy eating plan that has been shown to reduce high blood pressure (hypertension). It may also reduce your risk for type 2 diabetes, heart disease, and stroke. With the DASH eating plan, you  should limit salt (sodium) intake to 2,300 mg a day. If you have hypertension, you may need to reduce your sodium intake to 1,500 mg a day. When on the DASH eating plan, aim to eat more fresh fruits and vegetables, whole grains, lean proteins, low-fat dairy, and heart-healthy fats. Work with your health care provider or diet and nutrition specialist (dietitian) to adjust your eating plan to your individual calorie needs. This information is not intended to replace advice given to you by your health care provider. Make sure you discuss any questions you have with your health care provider. Document Released: 06/06/2011 Document Revised: 05/30/2017 Document Reviewed: 06/10/2016 Elsevier Patient Education  2020 [employer]. 11/

## 2021-01-08 NOTE — Progress Notes (Signed)
Discussed with patient health coaching regarding stress management. Patient is interested in health coaching and will call to schedule initial session today once she reviews her work schedule.

## 2021-01-08 NOTE — Assessment & Plan Note (Signed)
Patient snores and has known OSA.  Refer for sleep study.

## 2021-01-08 NOTE — Assessment & Plan Note (Addendum)
Blood pressure was initially slightly above goal but better on repeat.  Therefore we will not make any changes.  It has generally been well have been controlled at home.  Continue amlodipine.  We did discuss lifestyle modification increase including increasing her exercise to at least 150 minutes weekly and continue to limit sodium intake.  She may be interested in the PREP exercise program through the [facility].  We will have them contact her.  She also has known OSA and has not been on a CPAP for several years.  We will refer her for a sleep study.  We will also refer her for stress management with our care guide.

## 2021-01-12 ENCOUNTER — Telehealth: Payer: Self-pay

## 2021-01-12 ENCOUNTER — Telehealth: Payer: Self-pay | Admitting: *Deleted

## 2021-01-12 NOTE — Telephone Encounter (Signed)
Prior Authorization for split night sleep study sent to [facility] via web portal. Tracking Number [id number].

## 2021-01-12 NOTE — Telephone Encounter (Signed)
-----   Message from Burnell Blanks, LPN sent at 01/31/2121  1:49 PM EDT ----- Hello all Patient needs sleep study Thanks Juliette Alcide

## 2021-01-12 NOTE — Telephone Encounter (Signed)
VMT pt requesting call back reference PREP referral and new classes starting

## 2021-01-23 NOTE — Telephone Encounter (Signed)
Insurance denied  in lab sleep study.Dr Duke Salvia is it okay to order HST? No PA is required.

## 2021-02-26 ENCOUNTER — Other Ambulatory Visit: Payer: Self-pay | Admitting: Cardiovascular Disease

## 2021-02-26 DIAGNOSIS — I10 Essential (primary) hypertension: Secondary | ICD-10-CM

## 2021-02-26 DIAGNOSIS — G4733 Obstructive sleep apnea (adult) (pediatric): Secondary | ICD-10-CM

## 2021-03-01 NOTE — Telephone Encounter (Signed)
Patient notified of HST appointment details. °

## 2021-03-26 ENCOUNTER — Other Ambulatory Visit: Payer: Self-pay | Admitting: Family Medicine

## 2021-03-26 NOTE — Telephone Encounter (Signed)
Last office visit with Dr. Clent Ridges- 09/19/20 Last refill- 12/29/20   See Patient message from 09/27/20, and 10/10/20.   Can this patient receive a refill on this medication?

## 2021-03-30 ENCOUNTER — Encounter (HOSPITAL_BASED_OUTPATIENT_CLINIC_OR_DEPARTMENT_OTHER): Payer: No Typology Code available for payment source | Admitting: Cardiovascular Disease

## 2021-04-27 ENCOUNTER — Ambulatory Visit (HOSPITAL_BASED_OUTPATIENT_CLINIC_OR_DEPARTMENT_OTHER)
Payer: No Typology Code available for payment source | Attending: Cardiovascular Disease | Admitting: Cardiovascular Disease

## 2021-05-13 NOTE — Progress Notes (Deleted)
HPI: [name] is a 45 y.o. female, who is here today for her routine physical.  Last CPE: ***  Regular exercise 3 or more time per week: *** Following a healthy diet: *** She lives with ***  Chronic medical problems: ***  Immunization History  Administered Date(s) Administered   Influenza,inj,Quad PF,6+ Mos 03/31/2014, 05/03/2015   Tdap 08/18/2014   Health Maintenance  Topic Date Due   COVID-19 Vaccine (1) Never done   Pneumococcal Vaccine 55-39 Years old (1 - PCV) Never done   Hepatitis C Screening  Never done   PAP SMEAR-Modifier  07/01/2018   COLONOSCOPY (Pts 45-62yrs Insurance coverage will need to be confirmed)  Never done   INFLUENZA VACCINE  01/29/2021   TETANUS/TDAP  08/18/2024   HIV Screening  Completed   HPV VACCINES  Aged Out   She has *** concerns today.  Review of Systems  Current Outpatient Medications on File Prior to Visit  Medication Sig Dispense Refill   amLODipine (NORVASC) 10 MG tablet TAKE 1 TABLET BY MOUTH EVERY DAY 90 tablet 0   OVER THE COUNTER MEDICATION [place]     OVER THE COUNTER MEDICATION Hibiscus     OVER THE COUNTER MEDICATION Turmeric 1500mg      OVER THE COUNTER MEDICATION Elderberry syrup with other herbs as needed     No current facility-administered medications on file prior to visit.   Past Medical History:  Diagnosis Date   ADD (attention deficit disorder)    B12 deficiency    Chest pain    Chronic headaches    Depression    Fibromyalgia    Gallbladder disease    Hypertension    Insomnia    Joint pain    Lactose intolerance    MRSA (methicillin resistant Staphylococcus aureus) infection    OSA (obstructive sleep apnea) 03/24/2015   Psoriatic arthritis (HCC)    Systemic lupus erythematosus (HCC) 10/01/2016   -seeing dermatology and rheumatologist (Dr. 12/01/2016)   Vitamin D deficiency     Past Surgical History:  Procedure Laterality Date   APPENDECTOMY  2005   CESAREAN SECTION     TUBAL LIGATION  1998     Allergies  Allergen Reactions   Iodinated Diagnostic Agents Anaphylaxis   Dexamethasone     Severe vaginal and rectal burning   Iohexol      Desc: HIVES    Sulfa Antibiotics Hives   Sulfasalazine Hives    Family History  Problem Relation Age of Onset   Alcohol abuse Mother    Hypertension Mother    High Cholesterol Mother    Kidney disease Mother    Anxiety disorder Mother    Drug abuse Father    Hypertension Father    Emphysema Father    High Cholesterol Father    Anxiety disorder Father    Asthma Brother    Multiple sclerosis Brother    Depression Paternal Aunt    Depression Paternal Uncle    Heart failure Maternal Grandmother    Alcohol abuse Maternal Grandmother    Arthritis Maternal Grandmother    Cancer Maternal Grandmother    Hypertension Maternal Grandmother    Alcohol abuse Maternal Grandfather    Hypertension Maternal Grandfather    Heart failure Paternal Grandmother    Arthritis Paternal Grandmother    Cancer Paternal Grandmother    Hypertension Paternal Grandmother    Asthma Paternal Grandmother    Hypertension Paternal Grandfather    Asthma Daughter    Asthma Daughter  Social History   Socioeconomic History   Marital status: Divorced    Spouse name: Not on file   Number of children: Not on file   Years of education: Not on file   Highest education level: Not on file  Occupational History   Occupation: RN  Tobacco Use   Smoking status: Never   Smokeless tobacco: Never  Vaping Use   Vaping Use: Never used  Substance and Sexual Activity   Alcohol use: No    Alcohol/week: 0.0 standard drinks   Drug use: No   Sexual activity: Not on file  Other Topics Concern   Not on file  Social History Narrative   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, Community education officer      Home Situation: lives with her son and daughter      Spiritual Beliefs: Christian      Lifestyle: Curves, some exercise and trying to work on diet      Social Determinants  of Health   Financial Resource Strain: Low Risk    Difficulty of Paying Living Expenses: Not hard at all  Food Insecurity: No Food Insecurity   Worried About Programme researcher, broadcasting/film/video in the Last Year: Never true   Barista in the Last Year: Never true  Transportation Needs: No Transportation Needs   Lack of Transportation (Medical): No   Lack of Transportation (Non-Medical): No  Physical Activity: Inactive   Days of Exercise per Week: 0 days   Minutes of Exercise per Session: 0 min  Stress: No Stress Concern Present   Feeling of Stress : Only a little  Social Connections: Moderately Integrated   Frequency of Communication with Friends and Family: More than three times a week   Frequency of Social Gatherings with Friends and Family: More than three times a week   Attends Religious Services: More than 4 times per year   Active Member of [employer] or Organizations: Not on file   Attends Banker Meetings: 1 to 4 times per year   Marital Status: Divorced   There were no vitals filed for this visit. There is no height or weight on file to calculate BMI.  Wt Readings from Last 3 Encounters:  01/08/21 180 lb (81.6 kg)  09/19/20 184 lb (83.5 kg)  03/01/20 181 lb (82.1 kg)   Physical Exam  ASSESSMENT AND PLAN:  [name] was here today annual physical examination.  No orders of the defined types were placed in this encounter.   There are no diagnoses linked to this encounter.  No problem-specific Assessment & Plan notes found for this encounter.   No follow-ups on file.  Chrisette Man G. [place], MD  [facility]. [place] office.

## 2021-05-14 ENCOUNTER — Encounter: Payer: No Typology Code available for payment source | Admitting: Family Medicine

## 2021-05-14 NOTE — Progress Notes (Incomplete)
Advanced Hypertension Clinic Initial Assessment:    Date:  05/15/2021   ID:  Sandra Vang, DOB [birth year], MRN [id number]  PCP:  [place], Betty G, MD  Cardiologist:  None  Nephrologist:  Referring MD: [place], Betty G, MD   CC: Hypertension  History of Present Illness:    Sandra Vang is a 45 y.o. female with a hx of Hypertension, ADD, depression, Psoriatic arthritis, OSA, Systemic lupus erythematosus,  and fibromyalgia here to follow-up in the Advanced Hypertension Clinic. She was last seen at [facility] 12/19/2020 for viral illness. At the time, she was on lisinopril and amlodipine. Her blood pressure was 175/48.  She saw Dr. Cherly Vang on 09/2020 and her blood pressure was 164/100 so she was referred to advanced hypertension clinic.  She reportedly was diagnosed with hypertension in 2016. Her BP ranges at home were 110s-130s systolic/60s-80s diastolic. She noted that her blood pressure is higher when she is in pain. She had an injection in her shoulders for her chronic pain around a month prior. She was on amlodipine and was unsure why she stopped taking lisinopril. In the office, her blood pressure was well-controlled. She was referred to the PREP program and to sleep medicine. She has not yet had her sleep study.  Today, she  She denies any palpitations, chest pain, or shortness of breath, lightheadedness, headaches, syncope, orthopnea, PND, lower extremity edema or exertional symptoms.  Previous antihypertensives: HCTZ- hypokalemia   Past Medical History:  Diagnosis Date   ADD (attention deficit disorder)    B12 deficiency    Chest pain    Chronic headaches    Depression    Fibromyalgia    Gallbladder disease    Hypertension    Insomnia    Joint pain    Lactose intolerance    MRSA (methicillin resistant Staphylococcus aureus) infection    OSA (obstructive sleep apnea) 03/24/2015   Psoriatic arthritis (HCC)    Systemic lupus erythematosus (HCC) 10/01/2016    -seeing dermatology and rheumatologist (Dr. Kathi Vang)   Vitamin D deficiency     Past Surgical History:  Procedure Laterality Date   APPENDECTOMY  2005   CESAREAN SECTION     TUBAL LIGATION  1998    Current Medications: No outpatient medications have been marked as taking for the 05/15/21 encounter (Appointment) with Chilton Si, MD.     Allergies:   Iodinated diagnostic agents, Dexamethasone, Iohexol, Sulfa antibiotics, and Sulfasalazine   Social History   Socioeconomic History   Marital status: Divorced    Spouse name: Not on file   Number of children: Not on file   Years of education: Not on file   Highest education level: Not on file  Occupational History   Occupation: RN  Tobacco Use   Smoking status: Never   Smokeless tobacco: Never  Vaping Use   Vaping Use: Never used  Substance and Sexual Activity   Alcohol use: No    Alcohol/week: 0.0 standard drinks   Drug use: No   Sexual activity: Not on file  Other Topics Concern   Not on file  Social History Narrative   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, Community education officer      Home Situation: lives with her son and daughter      Spiritual Beliefs: Christian      Lifestyle: Curves, some exercise and trying to work on diet      Social Determinants of Health   Financial Resource Strain: Low Risk    Difficulty of  Paying Living Expenses: Not hard at all  Food Insecurity: No Food Insecurity   Worried About Running Out of Food in the Last Year: Never true   Ran Out of Food in the Last Year: Never true  Transportation Needs: No Transportation Needs   Lack of Transportation (Medical): No   Lack of Transportation (Non-Medical): No  Physical Activity: Inactive   Days of Exercise per Week: 0 days   Minutes of Exercise per Session: 0 min  Stress: No Stress Concern Present   Feeling of Stress : Only a little  Social Connections: Moderately Integrated   Frequency of Communication with Friends and Family: More than  three times a week   Frequency of Social Gatherings with Friends and Family: More than three times a week   Attends Religious Services: More than 4 times per year   Active Member of [employer] or Organizations: Not on file   Attends Banker Meetings: 1 to 4 times per year   Marital Status: Divorced     Family History: The patient's family history includes Alcohol abuse in her maternal grandfather, maternal grandmother, and mother; Anxiety disorder in her father and mother; Arthritis in her maternal grandmother and paternal grandmother; Asthma in her brother, daughter, daughter, and paternal grandmother; Cancer in her maternal grandmother and paternal grandmother; Depression in her paternal aunt and paternal uncle; Drug abuse in her father; Emphysema in her father; Heart failure in her maternal grandmother and paternal grandmother; High Cholesterol in her father and mother; Hypertension in her father, maternal grandfather, maternal grandmother, mother, paternal grandfather, and paternal grandmother; Kidney disease in her mother; Multiple sclerosis in her brother.  ROS:   Please see the history of present illness.    All other systems reviewed and are negative.  EKGs/Labs/Other Studies Reviewed:    No prior CV studies available.  EKG:   05/15/21: Sinus ***, rate *** bpm 01/08/2021: sinus rhythm, rate, 76 bpm  Recent Labs: 09/19/2020: BUN 11; Creatinine, Ser 0.82; Potassium 4.0; Sodium 139   Recent Lipid Panel    Component Value Date/Time   CHOL 223 (H) 07/07/2019 1256   TRIG 81 07/07/2019 1256   HDL 81 07/07/2019 1256   CHOLHDL 3 10/01/2016 1214   VLDL 9.0 10/01/2016 1214   LDLCALC 128 (H) 07/07/2019 1256    Physical Exam:   VS:  There were no vitals taken for this visit. , BMI There is no height or weight on file to calculate BMI. GENERAL:  Well appearing HEENT: Pupils equal round and reactive, fundi not visualized, oral mucosa unremarkable NECK:  No jugular venous  distention, waveform within normal limits, carotid upstroke brisk and symmetric, no bruits, no thyromegaly LYMPHATICS:  No cervical adenopathy LUNGS:  Clear to auscultation bilaterally HEART:  RRR.  PMI not displaced or sustained,S1 and S2 within normal limits, no S3, no S4, no clicks, no rubs, no murmurs ABD:  Flat, positive bowel sounds normal in frequency in pitch, no bruits, no rebound, no guarding, no midline pulsatile mass, no hepatomegaly, no splenomegaly EXT:  2 plus pulses throughout, no edema, no cyanosis no clubbing SKIN:  No rashes no nodules NEURO:  Cranial nerves II through XII grossly intact, motor grossly intact throughout PSYCH:  Cognitively intact, oriented to person place and time   ASSESSMENT/PLAN:    No problem-specific Assessment & Plan notes found for this encounter.   Screening for Secondary Hypertension:  Causes 01/08/2021  Drugs/Herbals (No Data)     - Comments 1 coffee in AM,  salt in eating out  Sleep Apnea Screened  Thyroid Disease Screened  Hyperaldosteronism Not Screened  Pheochromocytoma Not Screened  Cushing's Syndrome Not Screened    Relevant Labs/Studies: Basic Labs Latest Ref Rng & Units 09/19/2020 03/01/2020 02/19/2020  Sodium 135 - 145 mEq/L 139 137 136  Potassium 3.5 - 5.1 mEq/L 4.0 4.6 3.2(L)  Creatinine 0.40 - 1.20 mg/dL 0.56 9.79 4.80    Thyroid  Latest Ref Rng & Units 07/07/2019 06/16/2015  TSH 0.450 - 4.500 uIU/mL 1.970 1.833                  Disposition:    FU with MD/PharmD in *** months    Medication Adjustments/Labs and Tests Ordered: Current medicines are reviewed at length with the patient today.  Concerns regarding medicines are outlined above.  No orders of the defined types were placed in this encounter.  No orders of the defined types were placed in this encounter.  I,Mykaella Javier,acting as a scribe for Chilton Si, MD.,have documented all relevant documentation on the behalf of Chilton Si, MD,as directed  by  Chilton Si, MD while in the presence of Chilton Si, MD.  ***  Signed, Pieter Partridge  05/15/2021 8:43 AM    [facility]

## 2021-05-15 ENCOUNTER — Ambulatory Visit (HOSPITAL_BASED_OUTPATIENT_CLINIC_OR_DEPARTMENT_OTHER): Payer: No Typology Code available for payment source | Admitting: Cardiovascular Disease

## 2021-06-06 ENCOUNTER — Telehealth: Payer: Self-pay

## 2021-06-06 NOTE — Telephone Encounter (Signed)
Letter has been sent to patient instructing them to call us if they are still interested in completing their sleep study. If we have not received a response from the patient within 30 days of this notice, the order will be cancelled and they will need to discuss the need for a sleep study at their next office visit.  ° °

## 2021-08-09 ENCOUNTER — Ambulatory Visit (HOSPITAL_BASED_OUTPATIENT_CLINIC_OR_DEPARTMENT_OTHER): Payer: No Typology Code available for payment source | Admitting: Cardiovascular Disease

## 2021-08-10 ENCOUNTER — Encounter: Payer: Self-pay | Admitting: Family Medicine

## 2021-08-10 ENCOUNTER — Ambulatory Visit (INDEPENDENT_AMBULATORY_CARE_PROVIDER_SITE_OTHER): Payer: No Typology Code available for payment source | Admitting: Family Medicine

## 2021-08-10 VITALS — BP 140/80 | HR 74 | Temp 98.9°F | Resp 16 | Ht 61.0 in | Wt 187.0 lb

## 2021-08-10 DIAGNOSIS — Z131 Encounter for screening for diabetes mellitus: Secondary | ICD-10-CM

## 2021-08-10 DIAGNOSIS — I10 Essential (primary) hypertension: Secondary | ICD-10-CM | POA: Diagnosis not present

## 2021-08-10 DIAGNOSIS — R42 Dizziness and giddiness: Secondary | ICD-10-CM

## 2021-08-10 DIAGNOSIS — Z6835 Body mass index (BMI) 35.0-35.9, adult: Secondary | ICD-10-CM

## 2021-08-10 DIAGNOSIS — R002 Palpitations: Secondary | ICD-10-CM | POA: Diagnosis not present

## 2021-08-10 DIAGNOSIS — E876 Hypokalemia: Secondary | ICD-10-CM

## 2021-08-10 DIAGNOSIS — Z13220 Encounter for screening for lipoid disorders: Secondary | ICD-10-CM

## 2021-08-10 MED ORDER — HYDROCHLOROTHIAZIDE 25 MG PO TABS: 25.0000 mg | ORAL_TABLET | Freq: Every day | ORAL | 0 refills | Status: DC

## 2021-08-10 NOTE — Progress Notes (Signed)
ACUTE VISIT Chief Complaint  Patient presents with   Palpitations   HPI: [name] is a 46 y.o. female with hx of polyarthralgia,vit D def,fibromyalgia,and HTN here today complaining of a week of palpitations, which she is reporting as a new problem.  Palpitations  This is a new problem. The current episode started in the past 7 days. Nothing aggravates the symptoms. Associated symptoms include anxiety. Pertinent negatives include no chest fullness, coughing, diaphoresis, fever, irregular heartbeat, malaise/fatigue, nausea, near-syncope, shortness of breath, syncope, vomiting or weakness. She has tried nothing for the symptoms. Risk factors include obesity and sedentary lifestyle.  She is requesting a Holter monitor.  No new stressors, her daughter and granddaughter moved with her but she is enjoying their company. No dietary changes or more caffeine intake. No new supplements or wt loss medications.  Having daily episodes and when lying down in bed, she can feel her heart beating, not skipping beats. She does not feel it during the day. "Flushing" sensation sometimes, states that she is having hot flashes. Episodes last about 30 min.  No associated GI symptoms.  She was last seen on 02/23/20, virtual visit. Since her last visit she has seen cardiologist, Dr Oval Linsey. OSA , sleep study has been ordered but office has not been able to contact pt, so letter was sent 05/2021. Increase water intake.  149/93 , checked BP a few days ago and mildly elevated at 149/93, she was having headache.She took HCTZ and aspirin. She had an episode of tingling sensation, started in tongue then head while she was at work and lasted about 4 hours. She left work, took a shower, and did lie down; felt better a few hours later. Hx of migraines. She had her eye exam recently and it was "fine."  HTN since 2016. She is not on pharmacologic treatment. SBP 120-140 and 152 today. DBP 80-103. She  was on HCTZ but caused hypoK+. Amlodipine cause edema. Lisinopril caused cough.  Lab Results  Component Value Date   CREATININE 0.82 09/19/2020   BUN 11 09/19/2020   NA 139 09/19/2020   K 4.0 09/19/2020   CL 102 09/19/2020   CO2 27 09/19/2020   States that she needs help with wt loss. She is exercising some, "every now and then" She has not been consistent with following a healthful diet. During the day she has had episodes of lightheadedness for the past 3-4 days. She has not identified exacerbating or alleviating factors. She skips meals during the day and sometimes eats once daily.  She is fasting today, she would like to have fasting labs done today.  Review of Systems  Constitutional:  Negative for diaphoresis, fever and malaise/fatigue.  HENT:  Negative for nosebleeds and sore throat.   Respiratory:  Negative for cough and shortness of breath.   Cardiovascular:  Positive for palpitations. Negative for syncope and near-syncope.  Gastrointestinal:  Negative for abdominal pain, nausea and vomiting.  Endocrine: Negative for cold intolerance, heat intolerance, polydipsia and polyphagia.  Genitourinary:  Negative for decreased urine volume, dysuria and hematuria.  Musculoskeletal:  Negative for gait problem and myalgias.  Neurological:  Negative for syncope, facial asymmetry and weakness.  Psychiatric/Behavioral:  Negative for confusion. The patient is nervous/anxious.   Rest see pertinent positives and negatives per HPI.  Current Outpatient Medications on File Prior to Visit  Medication Sig Dispense Refill   OVER THE COUNTER MEDICATION [place]     OVER THE COUNTER MEDICATION Hibiscus     OVER THE  COUNTER MEDICATION Turmeric 1500mg      OVER THE COUNTER MEDICATION Elderberry syrup with other herbs as needed     amLODipine (NORVASC) 10 MG tablet TAKE 1 TABLET BY MOUTH EVERY DAY (Patient not taking: Reported on 08/10/2021) 90 tablet 0   No current facility-administered  medications on file prior to visit.   Past Medical History:  Diagnosis Date   ADD (attention deficit disorder)    B12 deficiency    Chest pain    Chronic headaches    Depression    Fibromyalgia    Gallbladder disease    Hypertension    Insomnia    Joint pain    Lactose intolerance    MRSA (methicillin resistant Staphylococcus aureus) infection    OSA (obstructive sleep apnea) 03/24/2015   Psoriatic arthritis ([place])    Systemic lupus erythematosus ([place]) 10/01/2016   -seeing dermatology and rheumatologist (Dr. Dossie Der)   Vitamin D deficiency    Allergies  Allergen Reactions   Iodinated Contrast Media Anaphylaxis   Dexamethasone     Severe vaginal and rectal burning   Iohexol      Desc: HIVES    Sulfa Antibiotics Hives   Sulfasalazine Hives    Social History   Socioeconomic History   Marital status: Divorced    Spouse name: Not on file   Number of children: Not on file   Years of education: Not on file   Highest education level: Not on file  Occupational History   Occupation: RN  Tobacco Use   Smoking status: Never   Smokeless tobacco: Never  Vaping Use   Vaping Use: Never used  Substance and Sexual Activity   Alcohol use: No    Alcohol/week: 0.0 standard drinks   Drug use: No   Sexual activity: Not on file  Other Topics Concern   Not on file  Social History Narrative   Work or School: Therapist, sports - Engineer, manufacturing systems, Airline pilot      Home Situation: lives with her son and daughter      Spiritual Beliefs: Christian      Lifestyle: Curves, some exercise and trying to work on diet      Social Determinants of Health   Financial Resource Strain: Low Risk    Difficulty of Paying Living Expenses: Not hard at all  Food Insecurity: No Food Insecurity   Worried About Charity fundraiser in the Last Year: Never true   Arboriculturist in the Last Year: Never true  Transportation Needs: No Transportation Needs   Lack of Transportation (Medical): No   Lack of Transportation  (Non-Medical): No  Physical Activity: Inactive   Days of Exercise per Week: 0 days   Minutes of Exercise per Session: 0 min  Stress: No Stress Concern Present   Feeling of Stress : Only a little  Social Connections: Moderately Integrated   Frequency of Communication with Friends and Family: More than three times a week   Frequency of Social Gatherings with Friends and Family: More than three times a week   Attends Religious Services: More than 4 times per year   Active Member of Clubs or Organizations: Not on file   Attends Archivist Meetings: 1 to 4 times per year   Marital Status: Divorced   Vitals:   08/10/21 1514  BP: 140/80  Pulse: 74  Resp: 16  Temp: 98.9 F (37.2 C)  SpO2: 98%   Body mass index is 35.33 kg/m.  Physical Exam Vitals and nursing  note reviewed.  Constitutional:      General: She is not in acute distress.    Appearance: She is well-developed.  HENT:     Head: Normocephalic and atraumatic.     Mouth/Throat:     Mouth: Mucous membranes are moist.     Pharynx: Oropharynx is clear.  Eyes:     Conjunctiva/sclera: Conjunctivae normal.  Neck:     Thyroid: No thyroid mass.  Cardiovascular:     Rate and Rhythm: Normal rate and regular rhythm.     Pulses:          Dorsalis pedis pulses are 2+ on the right side and 2+ on the left side.     Heart sounds: No murmur heard. Pulmonary:     Effort: Pulmonary effort is normal. No respiratory distress.     Breath sounds: Normal breath sounds.  Abdominal:     Palpations: Abdomen is soft. There is no hepatomegaly or mass.     Tenderness: There is no abdominal tenderness.  Lymphadenopathy:     Cervical: No cervical adenopathy.  Skin:    General: Skin is warm.     Findings: No erythema or rash.  Neurological:     General: No focal deficit present.     Mental Status: She is alert and oriented to person, place, and time.     Cranial Nerves: No cranial nerve deficit.     Gait: Gait normal.   Psychiatric:        Mood and Affect: Mood is anxious.     Comments: Well groomed, good eye contact.    ASSESSMENT AND PLAN:  Annalyce was seen today for palpitations.  Diagnoses and all orders for this visit: Orders Placed This Encounter  Procedures   Lipid panel   TSH   Comprehensive metabolic panel   Hemoglobin A1c   CBC (no diff)   EKG 12-Lead   Lab Results  Component Value Date   TSH 1.66 08/10/2021   Lab Results  Component Value Date   CREATININE 0.81 08/10/2021   BUN 13 08/10/2021   NA 138 08/10/2021   K 3.8 08/10/2021   CL 100 08/10/2021   CO2 24 08/10/2021   Palpitations We discussed possible etiologies. Hx and examination due to suggest a serious process. Sent message in 08/2020 reporting palpitations.  She tried to arrange appt with her cardiologist but there was not one available at this time. She would like for me to let Dr Oval Linsey she was seen today for palpitations. It seems like cardio appt was cancelled by pt on 08/09/21, no show 05/15/21. She is to call to reschedule appt.  EKG today SR, normal axis,early repolarization changes, ? LAE. No significant changes when compared with prior EKG's. Clearly instructed about warning signs. Further recommendations according to lab results.  Essential hypertension BP mildly elevated today. We discussed pharmacologic treatment options and some side effects. She would like to try Thiazide, HCTZ side effects discussed.  Low salt diet. Continue monitoring BP regularly.  -     hydrochlorothiazide (HYDRODIURIL) 25 MG tablet; Take 1 tablet (25 mg total) by mouth daily.  Class 2 severe obesity with serious comorbidity and body mass index (BMI) of 35.0 to 35.9 in adult, unspecified obesity type [facility]) She understands benefits of wt loss as well as adverse effects of obesity. Consistency with healthy diet and physical activity recommended. We could arrange appt with wt loss clinic, she did not make a decision  today. Plant-based diet recommended, avoid skipping meals,plenty  of vegetables, fruits,low meat and process foods intake.  Hypokalemia Caused by diuretics in the past. K+ supplementation will be added if K+ < 4.0.  Episodic lightheadedness No associated with palpitations. Adequate hydration and avoiding skipping meals recommended. Further recommendations according to lab results.  Screening for lipid disorders -     Lipid panel  Diabetes mellitus screening -     Hemoglobin A1c  I spent a total of 41 minutes in both face to face and non face to face activities for this visit on the date of this encounter. During this time history was obtained and documented, examination was performed, prior labs/imaging reviewed, and assessment/plan discussed.  Return in about 3 months (around 11/07/2021), or if symptoms worsen or fail to improve, for Keep next appt.  Ashauna Bertholf G. [place], MD  [facility]. [place] office.

## 2021-08-10 NOTE — Patient Instructions (Signed)
A few things to remember from today's visit:  Palpitations - Plan: EKG 12-Lead  Essential hypertension - Plan: hydrochlorothiazide (HYDRODIURIL) 25 MG tablet  If you need refills please call your pharmacy. Do not use My Chart to request refills or for acute issues that need immediate attention.   We can have labs 2 days before your appt next week. HCTZ started today. Increase green vegetables intake to help with potassium. I will be glad to send a message to your cardiologist.  Palpitations Palpitations are feelings that your heartbeat is not normal. Your heartbeat may feel like it is: Uneven (irregular). Faster than normal. Fluttering. Skipping a beat. This is usually not a serious problem. However, a doctor will do tests and check your medical history to make sure that you do not have a serious heart problem. Follow these instructions at home: Watch for any changes in your condition. Tell your doctor about any changes. Take these actions to help manage your symptoms: Eating and drinking Follow instructions from your doctor about things to eat and drink. You may be told to avoid these things: Drinks that have caffeine in them, such as coffee, tea, soft drinks, and energy drinks. Chocolate. Alcohol. Diet pills. Lifestyle   Try to lower your stress. These things can help you relax: Yoga. Deep breathing and meditation. Guided imagery. This is using words and images to create positive thoughts. Exercise, including swimming, jogging, and walking. Tell your doctor if you have more abnormal heartbeats when you are active. If you have chest pain or feel short of breath with exercise, do not keep doing the exercise until you are seen by your doctor. Biofeedback. This is using your mind to control things in your body, such as your heartbeat. Get plenty of rest and sleep. Keep a regular bed time. Do not use drugs, such as cocaine or ecstasy. Do not use marijuana. Do not smoke or use any  products that contain nicotine or tobacco. If you need help quitting, ask your doctor. General instructions Take over-the-counter and prescription medicines only as told by your doctor. Keep all follow-up visits. You may need more tests if palpitations do not go away or get worse. Contact a doctor if: You keep having fast or uneven heartbeats for a long time. Your symptoms happen more often. Get help right away if: You have chest pain. You feel short of breath. You have a very bad headache. You feel dizzy. You faint. These symptoms may be an emergency. Get help right away. Call your local emergency services (911 in the [place] Do not wait to see if the symptoms will go away. Do not drive yourself to the hospital. Summary Palpitations are feelings that your heartbeat is uneven or faster than normal. It may feel like your heart is fluttering or skipping a beat. Avoid food and drinks that may cause this condition. These include caffeine, chocolate, and alcohol. Try to lower your stress. Do not smoke or use drugs. Get help right away if you faint, feel dizzy, feel short of breath, have chest pain, or have a very bad headache. This information is not intended to replace advice given to you by your health care provider. Make sure you discuss any questions you have with your health care provider. Document Revised: 11/08/2020 Document Reviewed: 11/08/2020 Elsevier Patient Education  2022 [employer]  Please be sure medication list is accurate. If a new problem present, please set up appointment sooner than planned today.

## 2021-08-11 LAB — COMPREHENSIVE METABOLIC PANEL
AG Ratio: 1.6 (calc) (ref 1.0–2.5)
ALT: 8 U/L (ref 6–29)
AST: 15 U/L (ref 10–35)
Albumin: 4.5 g/dL (ref 3.6–5.1)
Alkaline phosphatase (APISO): 72 U/L (ref 31–125)
BUN: 13 mg/dL (ref 7–25)
CO2: 24 mmol/L (ref 20–32)
Calcium: 9.6 mg/dL (ref 8.6–10.2)
Chloride: 100 mmol/L (ref 98–110)
Creat: 0.81 mg/dL (ref 0.50–0.99)
Globulin: 2.9 g/dL (calc) (ref 1.9–3.7)
Glucose, Bld: 77 mg/dL (ref 65–99)
Potassium: 3.8 mmol/L (ref 3.5–5.3)
Sodium: 138 mmol/L (ref 135–146)
Total Bilirubin: 0.4 mg/dL (ref 0.2–1.2)
Total Protein: 7.4 g/dL (ref 6.1–8.1)

## 2021-08-11 LAB — LIPID PANEL
Cholesterol: 227 mg/dL — ABNORMAL HIGH (ref <200)
HDL: 78 mg/dL (ref >=50)
LDL Cholesterol (Calc): 132 mg/dL (calc) — ABNORMAL HIGH
Non-HDL Cholesterol (Calc): 149 mg/dL (calc) — ABNORMAL HIGH (ref <130)
Total CHOL/HDL Ratio: 2.9 (calc) (ref <5.0)
Triglycerides: 80 mg/dL (ref <150)

## 2021-08-11 LAB — TSH: TSH: 1.66 mIU/L

## 2021-08-11 MED ORDER — POTASSIUM CHLORIDE CRYS ER 20 MEQ PO TBCR: 20.0000 meq | EXTENDED_RELEASE_TABLET | Freq: Every day | ORAL | 1 refills | Status: DC

## 2021-08-17 ENCOUNTER — Encounter: Payer: Self-pay | Admitting: Family Medicine

## 2021-08-17 ENCOUNTER — Ambulatory Visit (INDEPENDENT_AMBULATORY_CARE_PROVIDER_SITE_OTHER): Payer: No Typology Code available for payment source | Admitting: Family Medicine

## 2021-08-17 VITALS — BP 138/80 | HR 79 | Resp 16 | Ht 61.0 in | Wt 190.5 lb

## 2021-08-17 DIAGNOSIS — Z1211 Encounter for screening for malignant neoplasm of colon: Secondary | ICD-10-CM

## 2021-08-17 DIAGNOSIS — I10 Essential (primary) hypertension: Secondary | ICD-10-CM | POA: Diagnosis not present

## 2021-08-17 DIAGNOSIS — E785 Hyperlipidemia, unspecified: Secondary | ICD-10-CM | POA: Diagnosis not present

## 2021-08-17 DIAGNOSIS — Z131 Encounter for screening for diabetes mellitus: Secondary | ICD-10-CM

## 2021-08-17 DIAGNOSIS — Z0000 Encounter for general adult medical examination without abnormal findings: Secondary | ICD-10-CM | POA: Diagnosis not present

## 2021-08-17 DIAGNOSIS — R002 Palpitations: Secondary | ICD-10-CM

## 2021-08-17 DIAGNOSIS — M329 Systemic lupus erythematosus, unspecified: Secondary | ICD-10-CM

## 2021-08-17 NOTE — Progress Notes (Signed)
HPI: [name] is a 46 y.o. female, who is here today for her routine physical.  Last CPE: Over a year ago.  Regular exercise 3 or more time per week: 20 min 3 times per week of video exercise and planning on increasing it. Following a healthful diet: In general she does, most of the time she cooks, eats salads, eats 1-2 times per day..  Chronic medical problems: OSA,HTN,vit D def, lupus,and arthralgias among some.  Immunization History  Administered Date(s) Administered   Influenza,inj,Quad PF,6+ Mos 03/31/2014, 05/03/2015   Tdap 08/18/2014   Health Maintenance  Topic Date Due   INFLUENZA VACCINE  02/07/2022 (Originally 01/29/2021)   COLONOSCOPY (Pts 45-31yrs Insurance coverage will need to be confirmed)  02/07/2022 (Originally 10/01/2020)   COVID-19 Vaccine (1) 02/07/2022 (Originally [birth year]/1976)   Hepatitis C Screening  02/07/2022 (Originally 10/01/1993)   PAP SMEAR-Modifier  07/01/2022   TETANUS/TDAP  08/18/2024   HIV Screening  Completed   HPV VACCINES  Aged Out   Last visit with gyn last year. Pap smear and mammogram up to date.  HLD: She is on non pharmacologic treatment. She follows low fat diet.  Lab Results  Component Value Date   CHOL 227 (H) 08/10/2021   HDL 78 08/10/2021   LDLCALC 132 (H) 08/10/2021   TRIG 80 08/10/2021   CHOLHDL 2.9 08/10/2021   HTN: She started HCTZ on 08/10/20. Medication has been well tolerated. Last visit, 08/10/21, she was concerned about palpitations.She received a call from her cardio but appt was not convenient, so she is on the cancellation list. No new associated symptoms.  Lab Results  Component Value Date   CREATININE 0.81 08/10/2021   BUN 13 08/10/2021   NA 138 08/10/2021   K 3.8 08/10/2021   CL 100 08/10/2021   CO2 24 08/10/2021  Concerned about CVD risk, states that her mother has dementia at 67 yo because "narrow" brain arteries, related with a genetic variation.  Review of Systems  Constitutional:  Negative for  appetite change and fever.  HENT:  Negative for hearing loss, mouth sores and sore throat.   Eyes:  Negative for redness and visual disturbance.  Respiratory:  Negative for cough, shortness of breath and wheezing.   Cardiovascular:  Negative for chest pain and leg swelling.  Gastrointestinal:  Negative for abdominal pain, nausea and vomiting.       No changes in bowel habits.  Endocrine: Negative for cold intolerance, heat intolerance, polydipsia, polyphagia and polyuria.  Genitourinary:  Negative for decreased urine volume, dysuria, hematuria, vaginal bleeding and vaginal discharge.  Musculoskeletal:  Negative for gait problem and myalgias.  Skin:  Negative for color change and rash.  Allergic/Immunologic: Negative for environmental allergies.  Neurological:  Negative for syncope, weakness and headaches.  Hematological:  Negative for adenopathy. Does not bruise/bleed easily.  Psychiatric/Behavioral:  Negative for confusion and hallucinations.   All other systems reviewed and are negative.  Current Outpatient Medications on File Prior to Visit  Medication Sig Dispense Refill   amLODipine (NORVASC) 10 MG tablet TAKE 1 TABLET BY MOUTH EVERY DAY 90 tablet 0   hydrochlorothiazide (HYDRODIURIL) 25 MG tablet Take 1 tablet (25 mg total) by mouth daily. 90 tablet 0   OVER THE COUNTER MEDICATION [place]     OVER THE COUNTER MEDICATION Hibiscus     OVER THE COUNTER MEDICATION Turmeric 1500mg      OVER THE COUNTER MEDICATION Elderberry syrup with other herbs as needed     potassium chloride SA (KLOR-CON  M) 20 MEQ tablet Take 1 tablet (20 mEq total) by mouth daily. 30 tablet 1   No current facility-administered medications on file prior to visit.   Past Medical History:  Diagnosis Date   ADD (attention deficit disorder)    B12 deficiency    Chest pain    Chronic headaches    Depression    Fibromyalgia    Gallbladder disease    Hypertension    Insomnia    Joint pain    Lactose  intolerance    MRSA (methicillin resistant Staphylococcus aureus) infection    OSA (obstructive sleep apnea) 03/24/2015   Psoriatic arthritis ([place])    Systemic lupus erythematosus ([place]) 10/01/2016   -seeing dermatology and rheumatologist (Dr. Dossie Der)   Vitamin D deficiency    Past Surgical History:  Procedure Laterality Date   APPENDECTOMY  2005   [place]   Allergies  Allergen Reactions   Iodinated Contrast Media Anaphylaxis   Dexamethasone     Severe vaginal and rectal burning   Iohexol      Desc: HIVES    Sulfa Antibiotics Hives   Sulfasalazine Hives   Family History  Problem Relation Age of Onset   Alcohol abuse Mother    Hypertension Mother    High Cholesterol Mother    Kidney disease Mother    Anxiety disorder Mother    Drug abuse Father    Hypertension Father    Emphysema Father    High Cholesterol Father    Anxiety disorder Father    Asthma Brother    Multiple sclerosis Brother    Depression Paternal Aunt    Depression Paternal Uncle    Heart failure Maternal Grandmother    Alcohol abuse Maternal Grandmother    Arthritis Maternal Grandmother    Cancer Maternal Grandmother    Hypertension Maternal Grandmother    Alcohol abuse Maternal Grandfather    Hypertension Maternal Grandfather    Heart failure Paternal Grandmother    Arthritis Paternal Grandmother    Cancer Paternal Grandmother    Hypertension Paternal Grandmother    Asthma Paternal Grandmother    Hypertension Paternal Grandfather    Asthma Daughter    Asthma Daughter     Social History   Socioeconomic History   Marital status: Divorced    Spouse name: Not on file   Number of children: Not on file   Years of education: Not on file   Highest education level: Not on file  Occupational History   Occupation: RN  Tobacco Use   Smoking status: Never   Smokeless tobacco: Never  Vaping Use   Vaping Use: Never used  Substance and Sexual Activity   Alcohol use:  No    Alcohol/week: 0.0 standard drinks   Drug use: No   Sexual activity: Not on file  Other Topics Concern   Not on file  Social History Narrative   Work or School: Therapist, sports - Engineer, manufacturing systems, Airline pilot      Home Situation: lives with her son and daughter      Spiritual Beliefs: Christian      Lifestyle: Curves, some exercise and trying to work on diet      Social Determinants of Health   Financial Resource Strain: Low Risk    Difficulty of Paying Living Expenses: Not hard at all  Food Insecurity: No Food Insecurity   Worried About Charity fundraiser in the Last Year: Never true   Ran Out  of Food in the Last Year: Never true  Transportation Needs: No Transportation Needs   Lack of Transportation (Medical): No   Lack of Transportation (Non-Medical): No  Physical Activity: Inactive   Days of Exercise per Week: 0 days   Minutes of Exercise per Session: 0 min  Stress: No Stress Concern Present   Feeling of Stress : Only a little  Social Connections: Moderately Integrated   Frequency of Communication with Friends and Family: More than three times a week   Frequency of Social Gatherings with Friends and Family: More than three times a week   Attends Religious Services: More than 4 times per year   Active Member of [employer] or Organizations: Not on file   Attends Banker Meetings: 1 to 4 times per year   Marital Status: Divorced   Vitals:   08/17/21 1452  BP: 138/80  Pulse: 79  Resp: 16  SpO2: 97%   Body mass index is 35.99 kg/m.  Wt Readings from Last 3 Encounters:  08/17/21 190 lb 8 oz (86.4 kg)  08/10/21 187 lb (84.8 kg)  01/08/21 180 lb (81.6 kg)   Physical Exam Vitals and nursing note reviewed.  Constitutional:      General: She is not in acute distress.    Appearance: She is well-developed.  HENT:     Head: Normocephalic and atraumatic.     Right Ear: Hearing, tympanic membrane, ear canal and external ear normal.     Left Ear: Hearing, tympanic  membrane, ear canal and external ear normal.     Mouth/Throat:     Mouth: Mucous membranes are moist.     Pharynx: Oropharynx is clear. Uvula midline.  Eyes:     Extraocular Movements: Extraocular movements intact.     Conjunctiva/sclera: Conjunctivae normal.     Pupils: Pupils are equal, round, and reactive to light.  Neck:     Thyroid: No thyromegaly.     Trachea: No tracheal deviation.  Cardiovascular:     Rate and Rhythm: Normal rate and regular rhythm.     Pulses:          Dorsalis pedis pulses are 2+ on the right side and 2+ on the left side.     Heart sounds: No murmur heard. Pulmonary:     Effort: Pulmonary effort is normal. No respiratory distress.     Breath sounds: Normal breath sounds.  Abdominal:     Palpations: Abdomen is soft. There is no hepatomegaly or mass.     Tenderness: There is no abdominal tenderness.  Genitourinary:    Comments: Deferred to gyn. Musculoskeletal:     Comments: No major deformity or signs of synovitis appreciated.  Lymphadenopathy:     Cervical: No cervical adenopathy.     Upper Body:     Right upper body: No supraclavicular adenopathy.     Left upper body: No supraclavicular adenopathy.  Skin:    General: Skin is warm.     Findings: No erythema or rash.  Neurological:     General: No focal deficit present.     Mental Status: She is alert and oriented to person, place, and time.     Cranial Nerves: No cranial nerve deficit.     Coordination: Coordination normal.     Gait: Gait normal.     Deep Tendon Reflexes:     Reflex Scores:      Bicep reflexes are 2+ on the right side and 2+ on the left side.  Patellar reflexes are 2+ on the right side and 2+ on the left side. Psychiatric:     Comments: Well groomed, good eye contact.   ASSESSMENT AND PLAN:  [name] was here today annual physical examination.  Orders Placed This Encounter  Procedures   Basic metabolic panel   Ambulatory referral to Gastroenterology    Lab Results  Component Value Date   WBC 6.3 08/17/2021   HGB 12.1 08/17/2021   HCT 37.5 08/17/2021   MCV 85.8 08/17/2021   PLT 397 08/17/2021   Lab Results  Component Value Date   HGBA1C 5.5 08/17/2021   Routine general medical examination at a health care facility We discussed the importance of regular physical activity and healthy diet for prevention of chronic illness and/or complications. Preventive guidelines reviewed. Vaccination up to date. Continue her female care with gyn. Next CPE in a year. The 10-year ASCVD risk score (Arnett DK, et al., 2019) is: 3.8%   Values used to calculate the score:     Age: 75 years     Sex: Female     Is Non-Hispanic African American: Yes     Diabetic: No     Tobacco smoker: Yes     Systolic Blood Pressure: 0000000 mmHg     Is BP treated: Yes     HDL Cholesterol: 78 mg/dL     Total Cholesterol: 227 mg/dL  Essential hypertension Continue HCTZ 25 mg daily. Monitor BP regularly. BMP in 1-2 months. F/U in 6 months unless she sees cardiologist 2 times per year.  Hyperlipidemia, unspecified hyperlipidemia type For now I think she can continue non pharmacologic treatment. We discussed a few OTC lowering cholesterol options, in particular red rice yeast and omega 3 fatty acids.  Colon cancer screening -     Ambulatory referral to Gastroenterology  Diabetes mellitus screening -     Hemoglobin A1c  Palpitations Stable. Pending appt with cardiologist.  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 Return in 6 months (on 02/14/2022).  Dushaun Okey G. [place], MD  [facility]. [place] office.

## 2021-08-17 NOTE — Addendum Note (Signed)
Addended by: Rosalyn Gess D on: 08/17/2021 03:45 PM   Modules accepted: Orders

## 2021-08-17 NOTE — Patient Instructions (Addendum)
A few things to remember from today's visit:  Routine general medical examination at a health care facility  Essential hypertension  Hyperlipidemia, unspecified hyperlipidemia type  Colon cancer screening - Plan: Ambulatory referral to Gastroenterology  If you need refills please call your pharmacy. Do not use My Chart to request refills or for acute issues that need immediate attention.   Please be sure medication list is accurate. If a new problem present, please set up appointment sooner than planned today. Health Maintenance, Female Adopting a healthy lifestyle and getting preventive care are important in promoting health and wellness. Ask your health care provider about: The right schedule for you to have regular tests and exams. Things you can do on your own to prevent diseases and keep yourself healthy. What should I know about diet, weight, and exercise? Eat a healthy diet  Eat a diet that includes plenty of vegetables, fruits, low-fat dairy products, and lean protein. Do not eat a lot of foods that are high in solid fats, added sugars, or sodium. Maintain a healthy weight Body mass index (BMI) is used to identify weight problems. It estimates body fat based on height and weight. Your health care provider can help determine your BMI and help you achieve or maintain a healthy weight. Get regular exercise Get regular exercise. This is one of the most important things you can do for your health. Most adults should: Exercise for at least 150 minutes each week. The exercise should increase your heart rate and make you sweat (moderate-intensity exercise). Do strengthening exercises at least twice a week. This is in addition to the moderate-intensity exercise. Spend less time sitting. Even light physical activity can be beneficial. Watch cholesterol and blood lipids Have your blood tested for lipids and cholesterol at 46 years of age, then have this test every 5 years. Have your  cholesterol levels checked more often if: Your lipid or cholesterol levels are high. You are older than 46 years of age. You are at high risk for heart disease. What should I know about cancer screening? Depending on your health history and family history, you may need to have cancer screening at various ages. This may include screening for: Breast cancer. Cervical cancer. Colorectal cancer. Skin cancer. Lung cancer. What should I know about heart disease, diabetes, and high blood pressure? Blood pressure and heart disease High blood pressure causes heart disease and increases the risk of stroke. This is more likely to develop in people who have high blood pressure readings or are overweight. Have your blood pressure checked: Every 3-5 years if you are 58-58 years of age. Every year if you are 65 years old or older. Diabetes Have regular diabetes screenings. This checks your fasting blood sugar level. Have the screening done: Once every three years after age 14 if you are at a normal weight and have a low risk for diabetes. More often and at a younger age if you are overweight or have a high risk for diabetes. What should I know about preventing infection? Hepatitis B If you have a higher risk for hepatitis B, you should be screened for this virus. Talk with your health care provider to find out if you are at risk for hepatitis B infection. Hepatitis C Testing is recommended for: Everyone born from 1 through 1965. Anyone with known risk factors for hepatitis C. Sexually transmitted infections (STIs) Get screened for STIs, including gonorrhea and chlamydia, if: You are sexually active and are younger than 46 years of  age. Bonita Quin are older than 46 years of age and your health care provider tells you that you are at risk for this type of infection. Your sexual activity has changed since you were last screened, and you are at increased risk for chlamydia or gonorrhea. Ask your health care  provider if you are at risk. Ask your health care provider about whether you are at high risk for HIV. Your health care provider may recommend a prescription medicine to help prevent HIV infection. If you choose to take medicine to prevent HIV, you should first get tested for HIV. You should then be tested every 3 months for as long as you are taking the medicine. Pregnancy If you are about to stop having your period (premenopausal) and you may become pregnant, seek counseling before you get pregnant. Take 400 to 800 micrograms (mcg) of folic acid every day if you become pregnant. Ask for birth control (contraception) if you want to prevent pregnancy. Osteoporosis and menopause Osteoporosis is a disease in which the bones lose minerals and strength with aging. This can result in bone fractures. If you are 22 years old or older, or if you are at risk for osteoporosis and fractures, ask your health care provider if you should: Be screened for bone loss. Take a calcium or vitamin D supplement to lower your risk of fractures. Be given hormone replacement therapy (HRT) to treat symptoms of menopause. Follow these instructions at home: Alcohol use Do not drink alcohol if: Your health care provider tells you not to drink. You are pregnant, may be pregnant, or are planning to become pregnant. If you drink alcohol: Limit how much you have to: 0-1 drink a day. Know how much alcohol is in your drink. In the [place] one drink equals one 12 oz bottle of beer (355 mL), one 5 oz glass of wine (148 mL), or one 1 oz glass of hard liquor (44 mL). Lifestyle Do not use any products that contain nicotine or tobacco. These products include cigarettes, chewing tobacco, and vaping devices, such as e-cigarettes. If you need help quitting, ask your health care provider. Do not use street drugs. Do not share needles. Ask your health care provider for help if you need support or information about quitting drugs. General  instructions Schedule regular health, dental, and eye exams. Stay current with your vaccines. Tell your health care provider if: You often feel depressed. You have ever been abused or do not feel safe at home. Summary Adopting a healthy lifestyle and getting preventive care are important in promoting health and wellness. Follow your health care provider's instructions about healthy diet, exercising, and getting tested or screened for diseases. Follow your health care provider's instructions on monitoring your cholesterol and blood pressure. This information is not intended to replace advice given to you by your health care provider. Make sure you discuss any questions you have with your health care provider. Document Revised: 11/06/2020 Document Reviewed: 11/06/2020 Elsevier Patient Education  2022 [employer].

## 2021-08-18 LAB — HEMOGLOBIN A1C
Hgb A1c MFr Bld: 5.5 % of total Hgb (ref <5.7)
Mean Plasma Glucose: 111 mg/dL
eAG (mmol/L): 6.2 mmol/L

## 2021-08-18 LAB — CBC
HCT: 37.5 % (ref 35.0–45.0)
Hemoglobin: 12.1 g/dL (ref 11.7–15.5)
MCH: 27.7 pg (ref 27.0–33.0)
MCHC: 32.3 g/dL (ref 32.0–36.0)
MCV: 85.8 fL (ref 80.0–100.0)
MPV: 10 fL (ref 7.5–12.5)
Platelets: 397 10*3/uL (ref 140–400)
RBC: 4.37 10*6/uL (ref 3.80–5.10)
RDW: 13.4 % (ref 11.0–15.0)
WBC: 6.3 10*3/uL (ref 3.8–10.8)

## 2021-08-19 ENCOUNTER — Encounter: Payer: Self-pay | Admitting: Family Medicine

## 2021-08-22 ENCOUNTER — Ambulatory Visit (INDEPENDENT_AMBULATORY_CARE_PROVIDER_SITE_OTHER): Payer: No Typology Code available for payment source

## 2021-08-22 ENCOUNTER — Other Ambulatory Visit: Payer: Self-pay

## 2021-08-22 ENCOUNTER — Encounter (HOSPITAL_BASED_OUTPATIENT_CLINIC_OR_DEPARTMENT_OTHER): Payer: Self-pay | Admitting: Cardiovascular Disease

## 2021-08-22 ENCOUNTER — Ambulatory Visit (INDEPENDENT_AMBULATORY_CARE_PROVIDER_SITE_OTHER): Payer: No Typology Code available for payment source | Admitting: Cardiovascular Disease

## 2021-08-22 VITALS — BP 134/84 | HR 70 | Ht 61.0 in | Wt 189.0 lb

## 2021-08-22 DIAGNOSIS — R002 Palpitations: Secondary | ICD-10-CM

## 2021-08-22 DIAGNOSIS — Z5181 Encounter for therapeutic drug level monitoring: Secondary | ICD-10-CM

## 2021-08-22 DIAGNOSIS — G4733 Obstructive sleep apnea (adult) (pediatric): Secondary | ICD-10-CM

## 2021-08-22 DIAGNOSIS — Z6834 Body mass index (BMI) 34.0-34.9, adult: Secondary | ICD-10-CM

## 2021-08-22 DIAGNOSIS — I10 Essential (primary) hypertension: Secondary | ICD-10-CM

## 2021-08-22 DIAGNOSIS — E669 Obesity, unspecified: Secondary | ICD-10-CM | POA: Diagnosis not present

## 2021-08-22 MED ORDER — SPIRONOLACTONE 25 MG PO TABS: ORAL_TABLET | ORAL | 3 refills | Status: DC

## 2021-08-22 NOTE — Patient Instructions (Signed)
Medication Instructions:  START SPIRONOLACTONE 25 MG 1/2 TABLET DAILY   DO NOT START THE POTASSIUM THAT WAS PRESCRIBED   *If you need a refill on your cardiac medications before your next appointment, please call your pharmacy*  Lab Work: BMET IN 1 WEEK   If you have labs (blood work) drawn today and your tests are completely normal, you will receive your results only by: [place] (if you have MyChart) OR A paper copy in the mail If you have any lab test that is abnormal or we need to change your treatment, we will call you to review the results.  Testing/Procedures: 7 DAY ZIO MONITOR   HOME SLEEP STUDY   Follow-Up: At [facility], you and your health needs are our priority.  As part of our continuing mission to provide you with exceptional heart care, we have created designated Provider Care Teams.  These Care Teams include your primary Cardiologist (physician) and Advanced Practice Providers (APPs -  Physician Assistants and Nurse Practitioners) who all work together to provide you with the care you need, when you need it.  We recommend signing up for the patient portal called "MyChart".  Sign up information is provided on this After Visit Summary.  MyChart is used to connect with patients for Virtual Visits (Telemedicine).  Patients are able to view lab/test results, encounter notes, upcoming appointments, etc.  Non-urgent messages can be sent to your provider as well.   To learn more about what you can do with MyChart, go to [email or web address].    Your next appointment:   2 month(s)  The format for your next appointment:   In Person  Provider:   Skeet Latch, MD   Other Instructions  Bryn Gulling- Long Term Monitor Instructions  Your physician has requested you wear a ZIO patch monitor for 7 days.  This is a single patch monitor. [employer] supplies one patch monitor per enrollment. Additional stickers are not available. Please do not apply patch if you will be  having a Nuclear Stress Test,  Echocardiogram, Cardiac CT, MRI, or Chest Xray during the period you would be wearing the  monitor. The patch cannot be worn during these tests. You cannot remove and re-apply the  ZIO XT patch monitor.  Your ZIO patch monitor will be mailed 3 day USPS to your address on file. It may take 3-5 days  to receive your monitor after you have been enrolled.  Once you have received your monitor, please review the enclosed instructions. Your monitor  has already been registered assigning a specific monitor serial # to you.  Billing and Patient Assistance Program Information  We have supplied [employer] with any of your insurance information on file for billing purposes. [employer] offers a sliding scale Patient Assistance Program for patients that do not have  insurance, or whose insurance does not completely cover the cost of the ZIO monitor.  You must apply for the Patient Assistance Program to qualify for this discounted rate.  To apply, please call [employer] at [phone], select option 4, select option 2, ask to apply for  Patient Assistance Program. Theodore Demark will ask your household income, and how many people  are in your household. They will quote your out-of-pocket cost based on that information.  [employer] will also be able to set up a 24-month, interest-free payment plan if needed.  Applying the monitor   Shave hair from upper left chest.  Hold abrader disc by orange tab. Rub abrader in 40 strokes over the  upper left chest as  indicated in your monitor instructions.  Clean area with 4 enclosed alcohol pads. Let dry.  Apply patch as indicated in monitor instructions. Patch will be placed under collarbone on left  side of chest with arrow pointing upward.  Rub patch adhesive wings for 2 minutes. Remove white label marked "1". Remove the white  label marked "2". Rub patch adhesive wings for 2 additional minutes.  While looking in a mirror, press and release button  in center of patch. A small green light will  flash 3-4 times. This will be your only indicator that the monitor has been turned on.  Do not shower for the first 24 hours. You may shower after the first 24 hours.  Press the button if you feel a symptom. You will hear a small click. Record Date, Time and  Symptom in the Patient Logbook.  When you are ready to remove the patch, follow instructions on the last 2 pages of Patient  Logbook. Stick patch monitor onto the last page of Patient Logbook.  Place Patient Logbook in the blue and white box. Use locking tab on box and tape box closed  securely. The blue and white box has prepaid postage on it. Please place it in the mailbox as  soon as possible. Your physician should have your test results approximately 7 days after the  monitor has been mailed back to [facility].  Call [place] at [phone] if you have questions regarding  your ZIO XT patch monitor. Call them immediately if you see an orange light blinking on your  monitor.  If your monitor falls off in less than 4 days, contact our Monitor department at [phone].  If your monitor becomes loose or falls off after 4 days call [employer] at [phone] for  suggestions on securing your monitor

## 2021-08-22 NOTE — Progress Notes (Signed)
Cardiology Office Note:   Date:  08/22/2021   ID:  Sandra Vang, DOB 1975-12-31, MRN [id number]  PCP:  [place], Betty G, MD  Cardiologist:  None  Nephrologist:  Referring MD: [place], Betty G, MD   CC: Follow-up  History of Present Illness:    Sandra Vang is a 46 y.o. female with a hx of hypertension, ADD, depression, psoriatic arthritis, OSA, systemic lupus erythematosus,  and fibromyalgia here for follow-up. She was initially seen 12/2020 to establish care in the Advanced Hypertension Clinic. She was seen at [facility] 12/19/2020 for viral illness. At the time, she was on lisinopril and amlodipine. Her blood pressure was 175/48.  She saw Dr. Cherly Hensen on 09/2020 and her blood pressure was 164/100 so she was referred to advanced hypertension clinic.  At her initial visit, her blood pressure was elevated but decreased on repeat. She was referred to [facility] and requested a 4 month follow-up. She saw her PCP 08/2020 and complained of palpitations. She was referred to cardiology.  At her last appointment, she was doing well. She was diagnosed with hypertension in 2016. She was taking amlodipine. She was referred for a sleep study because she has known OSA but had not used a CPAP for several years. She was ordered a sleep study but it has not been performed.   Today, she is not doing well. She reports intermittent palpitations which she notices primarily at night. The episodes occur 3 to 4 times a week and are brief. The palpitations cause her to hear a pounding in her ears. She describes the palpitations as a flushing sensation but she is still able to sleep. She does not feel the palpitations during the day but she does feel lightheaded and poorly. She had a few palpitation episodes that were so severe that she almost called emergency services. 2 weeks ago, she had an episode at work that started with lightheadedness and progressed to a tingling sensation in her mouth and a metallic  taste. She reports she almost lost consciousness. Her blood pressure at the time was 149/103. She took time off from work and rested for the rest of the day. However, her blood pressure remained elevated. She gets pain in her ribs in deep inspirations. She endorses a history of chest wall pain which has been a chronic issue since 2005. She also endorses general life stress. She recently learned her family has a predisposition to fibromuscular dysplasia. She had a first cousin and maternal uncle with aneurysms and it was discovered on genetic testing. Her mother has vascular dementia. Her grandfather and uncle both had MI events in their 67s. Her mother and many of her siblings also have kidney disease. She had taken amlodipine for a few months. However, the amlodipine caused her to swell and she switched herself to HCTZ. She has been taking the HCTZ for about 3 months. For activity, she exercises for 20 minutes in 10 minute bursts by following Youtube tutorials. She has an appointment with [facility] which she has tried in the past. She reduced her caffeine intake to 1 cup of half-decaf coffee in the morning. She was not able to do her sleep study but is still interested in taking it. She denies any shortness of breath, headaches, syncope, orthopnea, PND, or exertional symptoms.  Previous antihypertensives: HCTZ- hypokalemia Amlodipine - edema  Past Medical History:  Diagnosis Date   ADD (attention deficit disorder)    B12 deficiency    Chest pain    Chronic  headaches    Depression    Fibromyalgia    Gallbladder disease    Hypertension    Insomnia    Joint pain    Lactose intolerance    MRSA (methicillin resistant Staphylococcus aureus) infection    OSA (obstructive sleep apnea) 03/24/2015   Psoriatic arthritis (HCC)    Systemic lupus erythematosus (HCC) 10/01/2016   -seeing dermatology and rheumatologist (Dr. Kathi Ludwig)   Vitamin D deficiency     Past Surgical History:  Procedure Laterality  Date   APPENDECTOMY  2005   CESAREAN SECTION     TUBAL LIGATION  1998    Current Medications: Current Meds  Medication Sig   hydrochlorothiazide (HYDRODIURIL) 25 MG tablet Take 1 tablet (25 mg total) by mouth daily.   OVER THE COUNTER MEDICATION [place]   OVER THE COUNTER MEDICATION Hibiscus   OVER THE COUNTER MEDICATION Turmeric 1500mg    OVER THE COUNTER MEDICATION Elderberry syrup with other herbs as needed   spironolactone (ALDACTONE) 25 MG tablet Take 1/2 tablet daily     Allergies:   Iodinated contrast media, Dexamethasone, Iohexol, Sulfa antibiotics, and Sulfasalazine   Social History   Socioeconomic History   Marital status: Divorced    Spouse name: Not on file   Number of children: Not on file   Years of education: Not on file   Highest education level: Not on file  Occupational History   Occupation: RN  Tobacco Use   Smoking status: Never   Smokeless tobacco: Never  Vaping Use   Vaping Use: Never used  Substance and Sexual Activity   Alcohol use: No    Alcohol/week: 0.0 standard drinks   Drug use: No   Sexual activity: Not on file  Other Topics Concern   Not on file  Social History Narrative   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, Community education officer      Home Situation: lives with her son and daughter      Spiritual Beliefs: Christian      Lifestyle: Curves, some exercise and trying to work on diet      Social Determinants of Health   Financial Resource Strain: Low Risk    Difficulty of Paying Living Expenses: Not hard at all  Food Insecurity: No Food Insecurity   Worried About Programme researcher, broadcasting/film/video in the Last Year: Never true   Barista in the Last Year: Never true  Transportation Needs: No Transportation Needs   Lack of Transportation (Medical): No   Lack of Transportation (Non-Medical): No  Physical Activity: Inactive   Days of Exercise per Week: 0 days   Minutes of Exercise per Session: 0 min  Stress: No Stress Concern Present   Feeling  of Stress : Only a little  Social Connections: Moderately Integrated   Frequency of Communication with Friends and Family: More than three times a week   Frequency of Social Gatherings with Friends and Family: More than three times a week   Attends Religious Services: More than 4 times per year   Active Member of [employer] or Organizations: Not on file   Attends Banker Meetings: 1 to 4 times per year   Marital Status: Divorced     Family History: The patient's family history includes Alcohol abuse in her maternal grandfather, maternal grandmother, and mother; Anxiety disorder in her father and mother; Arthritis in her maternal grandmother and paternal grandmother; Asthma in her brother, daughter, daughter, and paternal grandmother; Cancer in her maternal grandmother and paternal  grandmother; Depression in her paternal aunt and paternal uncle; Drug abuse in her father; Emphysema in her father; Heart failure in her maternal grandmother and paternal grandmother; High Cholesterol in her father and mother; Hypertension in her father, maternal grandfather, maternal grandmother, mother, paternal grandfather, and paternal grandmother; Kidney disease in her mother; Multiple sclerosis in her brother.  ROS:   Please see the history of present illness.    (+) Palpitations (+) Lightheadedness (+) Mouth tingling/Metallic taste (+) Rib/Chest pain (+) Stress (+) Weight gain All other systems reviewed and are negative.  EKGs/Labs/Other Studies Reviewed:    No recent CV studies available.  EKG:  EKG was not ordered today 01/08/2021: sinus rhythm, rate, 76 bpm  Recent Labs: 08/10/2021: ALT 8; BUN 13; Creat 0.81; Potassium 3.8; Sodium 138; TSH 1.66 08/17/2021: Hemoglobin 12.1; Platelets 397   Recent Lipid Panel    Component Value Date/Time   CHOL 227 (H) 08/10/2021 1626   CHOL 223 (H) 07/07/2019 1256   TRIG 80 08/10/2021 1626   HDL 78 08/10/2021 1626   HDL 81 07/07/2019 1256   CHOLHDL  2.9 08/10/2021 1626   VLDL 9.0 10/01/2016 1214   LDLCALC 132 (H) 08/10/2021 1626    Physical Exam:   VS:  BP 134/84 (BP Location: Left Arm, Patient Position: Sitting, Cuff Size: Large)    Pulse 70    Ht 5\' 1"  (1.549 m)    Wt 189 lb (85.7 kg)    LMP 08/06/2021    BMI 35.71 kg/m  , BMI Body mass index is 35.71 kg/m. GENERAL:  Well appearing HEENT: Pupils equal round and reactive, fundi not visualized, oral mucosa unremarkable NECK:  No jugular venous distention, waveform within normal limits, carotid upstroke brisk and symmetric, no bruits LUNGS:  Clear to auscultation bilaterally HEART:  RRR.  PMI not displaced or sustained,S1 and S2 within normal limits, no S3, no S4, no clicks, no rubs, no murmurs ABD:  Flat, positive bowel sounds normal in frequency in pitch, no bruits, no rebound, no guarding, no midline pulsatile mass, no hepatomegaly, no splenomegaly EXT:  2 plus pulses throughout, no edema, no cyanosis no clubbing SKIN:  No rashes no nodules NEURO:  Cranial nerves II through XII grossly intact, motor grossly intact throughout PSYCH:  Cognitively intact, oriented to person place and time  ASSESSMENT/PLAN:    Essential hypertension Blood pressure is nearly controlled.  She had to stop amlodipine due to swelling.  She has had a history of hypokalemia on HCTZ.  Given that her blood pressure is also borderline we will add spironolactone 12.5 mg daily and remove the potassium from her list.  She has not been taking the potassium yet.  She notes that she does have a family history of fibromuscular dysplasia.  She is going to ask her family members if they are willing to share this specific genetic abnormality.  If so we can have her seen by a geneticist.  I suspect she does not have FMD, at least in the renal arteries, given that her blood pressure is relatively controlled on just 1 agent.  She plans to start a weight loss program in [place] later this month.  OSA (obstructive sleep  apnea) She has known OSA.  She is going to get her sleep study as she still has snoring and daytime somnolence.  Class 1 obesity with serious comorbidity and body mass index (BMI) of 34.0 to 34.9 in adult Continue working on diet and exercise.     Screening for Secondary  Hypertension:  Causes 01/08/2021  Drugs/Herbals (No Data)     - Comments 1 coffee in AM, salt in eating out  Sleep Apnea Screened  Thyroid Disease Screened  Hyperaldosteronism Not Screened  Pheochromocytoma Not Screened  Cushing's Syndrome Not Screened    Relevant Labs/Studies: Basic Labs Latest Ref Rng & Units 08/10/2021 09/19/2020 03/01/2020  Sodium 135 - 146 mmol/L 138 139 137  Potassium 3.5 - 5.3 mmol/L 3.8 4.0 4.6  Creatinine 0.50 - 0.99 mg/dL 8.46 9.62 9.52    Thyroid  Latest Ref Rng & Units 08/10/2021 07/07/2019  TSH mIU/L 1.66 1.970     Disposition: FU with Lygia Olaes C. Duke Salvia, MD, [facility] in 3-4 months  Medication Adjustments/Labs and Tests Ordered: Current medicines are reviewed at length with the patient today.  Concerns regarding medicines are outlined above.  Orders Placed This Encounter  Procedures   Basic metabolic panel   LONG TERM MONITOR (3-14 DAYS)   Home sleep test   Meds ordered this encounter  Medications   spironolactone (ALDACTONE) 25 MG tablet    Sig: Take 1/2 tablet daily    Dispense:  45 tablet    Refill:  3    D/C POTASSIUM RX   I,Mykaella Javier,acting as a scribe for Chilton Si, MD.,have documented all relevant documentation on the behalf of Chilton Si, MD,as directed by  Chilton Si, MD while in the presence of Chilton Si, MD.  I, Mikayah Joy C. Duke Salvia, MD have reviewed all documentation for this visit.  The documentation of the exam, diagnosis, procedures, and orders on 08/22/2021 are all accurate and complete.  Time spent: 45 minutes-Greater than 50% of this time was spent in counseling, explanation of diagnosis, planning of further management, and  coordination of care.    Signed, Chilton Si, MD  08/22/2021 11:10 PM    [facility]

## 2021-08-22 NOTE — Assessment & Plan Note (Signed)
Blood pressure is nearly controlled.  She had to stop amlodipine due to swelling.  She has had a history of hypokalemia on HCTZ.  Given that her blood pressure is also borderline we will add spironolactone 12.5 mg daily and remove the potassium from her list.  She has not been taking the potassium yet.  She notes that she does have a family history of fibromuscular dysplasia.  She is going to ask her family members if they are willing to share this specific genetic abnormality.  If so we can have her seen by a geneticist.  I suspect she does not have FMD, at least in the renal arteries, given that her blood pressure is relatively controlled on just 1 agent.  She plans to start a weight loss program in [place] later this month.

## 2021-08-22 NOTE — Assessment & Plan Note (Signed)
Continue working on diet and exercise.

## 2021-08-22 NOTE — Assessment & Plan Note (Signed)
She has known OSA.  She is going to get her sleep study as she still has snoring and daytime somnolence.

## 2021-08-22 NOTE — Progress Notes (Unsigned)
Enrolled for [employer] to mail a ZIO XT long term holter monitor to the patients address on file.  

## 2021-08-28 DIAGNOSIS — R002 Palpitations: Secondary | ICD-10-CM | POA: Diagnosis not present

## 2021-09-06 ENCOUNTER — Other Ambulatory Visit: Payer: Self-pay | Admitting: Family Medicine

## 2021-09-06 DIAGNOSIS — E876 Hypokalemia: Secondary | ICD-10-CM

## 2021-10-05 ENCOUNTER — Ambulatory Visit (HOSPITAL_BASED_OUTPATIENT_CLINIC_OR_DEPARTMENT_OTHER)
Payer: No Typology Code available for payment source | Attending: Cardiovascular Disease | Admitting: Cardiovascular Disease

## 2021-10-16 ENCOUNTER — Ambulatory Visit (HOSPITAL_BASED_OUTPATIENT_CLINIC_OR_DEPARTMENT_OTHER): Payer: No Typology Code available for payment source | Admitting: Cardiovascular Disease

## 2021-11-01 ENCOUNTER — Encounter (HOSPITAL_BASED_OUTPATIENT_CLINIC_OR_DEPARTMENT_OTHER): Payer: Self-pay | Admitting: Cardiovascular Disease

## 2021-11-05 ENCOUNTER — Encounter: Payer: Self-pay | Admitting: Family Medicine

## 2021-11-05 DIAGNOSIS — F32A Depression, unspecified: Secondary | ICD-10-CM

## 2021-11-05 NOTE — Telephone Encounter (Signed)
Okay to place referral to behavioral health? Her PHQ was a 14.  ?

## 2021-11-08 ENCOUNTER — Encounter (HOSPITAL_BASED_OUTPATIENT_CLINIC_OR_DEPARTMENT_OTHER): Payer: Self-pay | Admitting: Cardiovascular Disease

## 2021-11-08 ENCOUNTER — Ambulatory Visit (INDEPENDENT_AMBULATORY_CARE_PROVIDER_SITE_OTHER): Payer: No Typology Code available for payment source | Admitting: Cardiovascular Disease

## 2021-11-08 VITALS — BP 165/96 | HR 85 | Ht 61.0 in | Wt 196.9 lb

## 2021-11-08 DIAGNOSIS — G4733 Obstructive sleep apnea (adult) (pediatric): Secondary | ICD-10-CM | POA: Diagnosis not present

## 2021-11-08 DIAGNOSIS — I10 Essential (primary) hypertension: Secondary | ICD-10-CM | POA: Diagnosis not present

## 2021-11-08 DIAGNOSIS — Z5181 Encounter for therapeutic drug level monitoring: Secondary | ICD-10-CM

## 2021-11-08 DIAGNOSIS — M329 Systemic lupus erythematosus, unspecified: Secondary | ICD-10-CM

## 2021-11-08 MED ORDER — METOPROLOL SUCCINATE ER 25 MG PO TB24
25.0000 mg | ORAL_TABLET | Freq: Every day | ORAL | 3 refills | Status: DC
Start: 2021-11-08 — End: 2022-02-04

## 2021-11-08 MED ORDER — SPIRONOLACTONE 25 MG PO TABS
25.0000 mg | ORAL_TABLET | Freq: Every day | ORAL | 3 refills | Status: DC
Start: 2021-11-08 — End: 2022-05-14

## 2021-11-08 MED ORDER — SPIRONOLACTONE 25 MG PO TABS: 25.0000 mg | ORAL_TABLET | Freq: Every day | ORAL | 3 refills | Status: DC

## 2021-11-08 MED ORDER — METOPROLOL SUCCINATE ER 25 MG PO TB24: 25.0000 mg | ORAL_TABLET | Freq: Every day | ORAL | 3 refills | Status: DC

## 2021-11-08 NOTE — Assessment & Plan Note (Signed)
She will keep working on getting a CPAP. ?

## 2021-11-08 NOTE — Progress Notes (Signed)
? ?Cardiology Office Note:  ? ?Date:  11/08/2021  ? ?ID:  Sandra Vang, DOB 1976-01-25, MRN [id number] ? ?PCP:  [place], Betty G, MD  ?Cardiologist:  None  ?Nephrologist: ? ?Referring MD: [place], Betty G, MD  ? ?CC: Follow-up ? ?History of Present Illness:   ? ?Sandra Vang is a 46 y.o. female with a hx of hypertension, ADD, depression, psoriatic arthritis, OSA, systemic lupus erythematosus,  and fibromyalgia here for follow-up. She was initially seen 12/2020 to establish care in the Advanced Hypertension Clinic. She was seen at [facility] 12/19/2020 for viral illness. At the time, she was on lisinopril and amlodipine. Her blood pressure was 175/48.  She saw Dr. Cherly Hensen on 09/2020 and her blood pressure was 164/100 so she was referred to advanced hypertension clinic. ? ?At her initial visit, her blood pressure was elevated but decreased on repeat. She was referred to [facility] and requested a 4 month follow-up. She saw her PCP 08/2020 and complained of palpitations. She was referred to cardiology.  At her last appointment, she was doing well. She was diagnosed with hypertension in 2016. She was taking amlodipine. She was referred for a sleep study because she has known OSA but had not used a CPAP for several years. She was ordered a sleep study but it has not been performed. She recently learned her family has a predisposition to fibromuscular dysplasia. She had a first cousin and maternal uncle with aneurysms and it was discovered on genetic testing. Her mother has vascular dementia. Her grandfather and uncle both had MI events in their 66s. Her mother and many of her siblings also have kidney disease.  ? ?At her last appointment we recommended that she stop HCTZ and start spironolactone.  She has not yet started it.  She has been extremely busy and under a lot of stress.  Her daughter and grandchildren moved in with her and she has not had any time for herself.  Her blood pressures have been in the running  in the 160s at home.  She has not had any time for exercise.  She also struggles with her diet.  Her daughter is a Investment banker, operational but she has not asked her to limit the sodium in their food.  She wants to work on trying to incorporate exercise into her morning routines.  She reports doing intermittent fasting up to 16 hours at a time but still struggles to lose weight.  She has mild lower extremity edema but no orthopnea or PND.  She works as a Engineer, civil (consulting).  She struggles with anxiety as well. ? ?Previous antihypertensives: ?HCTZ- hypokalemia ?Amlodipine - edema ? ?Past Medical History:  ?Diagnosis Date  ? ADD (attention deficit disorder)   ? B12 deficiency   ? Chest pain   ? Chronic headaches   ? Depression   ? Fibromyalgia   ? Gallbladder disease   ? Hypertension   ? Insomnia   ? Joint pain   ? Lactose intolerance   ? MRSA (methicillin resistant Staphylococcus aureus) infection   ? OSA (obstructive sleep apnea) 03/24/2015  ? Psoriatic arthritis (HCC)   ? Systemic lupus erythematosus (HCC) 10/01/2016  ? -seeing dermatology and rheumatologist (Dr. Kathi Ludwig)  ? Vitamin D deficiency   ? ? ?Past Surgical History:  ?Procedure Laterality Date  ? APPENDECTOMY  2005  ? CESAREAN SECTION    ? TUBAL LIGATION  1998  ? ? ?Current Medications: ?Current Meds  ?Medication Sig  ? hydrochlorothiazide (HYDRODIURIL) 25 MG tablet Take 1  tablet (25 mg total) by mouth daily.  ? OVER THE COUNTER MEDICATION Turmeric 1500mg   ? OVER THE COUNTER MEDICATION Elderberry syrup with other herbs as needed  ? [DISCONTINUED] metoprolol succinate (TOPROL XL) 25 MG 24 hr tablet Take 1 tablet (25 mg total) by mouth daily.  ?  ? ?Allergies:   Iodinated contrast media, Dexamethasone, Iohexol, Sulfa antibiotics, and Sulfasalazine  ? ?Social History  ? ?Socioeconomic History  ? Marital status: Divorced  ?  Spouse name: Not on file  ? Number of children: Not on file  ? Years of education: Not on file  ? Highest education level: Not on file  ?Occupational History  ? Occupation:   ?Tobacco Use  ? Smoking status: Never  ? Smokeless tobacco: Never  ?Vaping Use  ? Vaping Use: Never used  ?Substance and Sexual Activity  ? Alcohol use: No  ?  Alcohol/week: 0.0 standard drinks  ? Drug use: No  ? Sexual activity: Not on file  ?Other Topics Concern  ? Not on file  ?Social History Narrative  ? Work or School: Charity fundraiser - Charity fundraiser, Careers information officer  ?   ? Home Situation: lives with her son and daughter  ?   ? Spiritual Beliefs: Christian  ?   ? Lifestyle: Curves, some exercise and trying to work on diet  ?   ? ?Social Determinants of Health  ? ?Financial Resource Strain: Low Risk   ? Difficulty of Paying Living Expenses: Not hard at all  ?Food Insecurity: No Food Insecurity  ? Worried About Community education officer in the Last Year: Never true  ? Ran Out of Food in the Last Year: Never true  ?Transportation Needs: No Transportation Needs  ? Lack of Transportation (Medical): No  ? Lack of Transportation (Non-Medical): No  ?Physical Activity: Inactive  ? Days of Exercise per Week: 0 days  ? Minutes of Exercise per Session: 0 min  ?Stress: No Stress Concern Present  ? Feeling of Stress : Only a little  ?Social Connections: Moderately Integrated  ? Frequency of Communication with Friends and Family: More than three times a week  ? Frequency of Social Gatherings with Friends and Family: More than three times a week  ? Attends Religious Services: More than 4 times per year  ? Active Member of Clubs or Organizations: Not on file  ? Attends Programme researcher, broadcasting/film/video Meetings: 1 to 4 times per year  ? Marital Status: Divorced  ?  ? ?Family History: ?The patient's family history includes Alcohol abuse in her maternal grandfather, maternal grandmother, and mother; Anxiety disorder in her father and mother; Arthritis in her maternal grandmother and paternal grandmother; Asthma in her brother, daughter, daughter, and paternal grandmother; Cancer in her maternal grandmother and paternal grandmother; Depression in her paternal  aunt and paternal uncle; Drug abuse in her father; Emphysema in her father; Heart failure in her maternal grandmother and paternal grandmother; High Cholesterol in her father and mother; Hypertension in her father, maternal grandfather, maternal grandmother, mother, paternal grandfather, and paternal grandmother; Kidney disease in her mother; Multiple sclerosis in her brother. ? ?ROS:   ?Please see the history of present illness.    ?(+) Palpitations ?(+) Lightheadedness ?(+) Mouth tingling/Metallic taste ?(+) Rib/Chest pain ?(+) Stress ?(+) Weight gain ?All other systems reviewed and are negative. ? ?EKGs/Labs/Other Studies Reviewed:   ? ?No recent CV studies available. ? ?EKG:  EKG was not ordered today ?01/08/2021: sinus rhythm, rate, 76 bpm ? ?Recent Labs: ?08/10/2021: ALT  8; BUN 13; Creat 0.81; Potassium 3.8; Sodium 138; TSH 1.66 ?08/17/2021: Hemoglobin 12.1; Platelets 397  ? ?Recent Lipid Panel ?   ?Component Value Date/Time  ? CHOL 227 (H) 08/10/2021 1626  ? CHOL 223 (H) 07/07/2019 1256  ? TRIG 80 08/10/2021 1626  ? HDL 78 08/10/2021 1626  ? HDL 81 07/07/2019 1256  ? CHOLHDL 2.9 08/10/2021 1626  ? VLDL 9.0 10/01/2016 1214  ? LDLCALC 132 (H) 08/10/2021 1626  ? ? ?Physical Exam:   ?VS:  BP (!) 165/96 (BP Location: Left Arm, Patient Position: Sitting, Cuff Size: Large)   Pulse 85   Ht 5\' 1"  (1.549 m)   Wt 196 lb 14.4 oz (89.3 kg)   SpO2 100%   BMI 37.20 kg/m?  , BMI Body mass index is 37.2 kg/m?. ?GENERAL:  Well appearing ?HEENT: Pupils equal round and reactive, fundi not visualized, oral mucosa unremarkable ?NECK:  No jugular venous distention, waveform within normal limits, carotid upstroke brisk and symmetric, no bruits ?LUNGS:  Clear to auscultation bilaterally ?HEART:  RRR.  PMI not displaced or sustained,S1 and S2 within normal limits, no S3, no S4, no clicks, no rubs, no murmurs ?ABD:  Flat, positive bowel sounds normal in frequency in pitch, no bruits, no rebound, no guarding, no midline pulsatile  mass, no hepatomegaly, no splenomegaly ?EXT:  2 plus pulses throughout, 1+ LE edema to the ankles, no cyanosis no clubbing ?SKIN:  No rashes no nodules ?NEURO:  Cranial nerves II through XII grossly intact, moto

## 2021-11-08 NOTE — Assessment & Plan Note (Signed)
Sandra Vang blood pressure remains uncontrolled.  She is now under more stress and is not getting much exercise.  She also struggles with sodium and eating healthy meals.  She will work on these things.  She is also had some palpitations.  Monitoring showed some PACs and PVCs but no sustained arrhythmias.  She had less than 1% PVCs.  We will try adding metoprolol succinate 25 mg daily to help with PVCs.  We will also add spironolactone 25 mg daily.  Initially plan to switch this for hydrochlorothiazide but given that her pressures are still elevated, we will not stop hydrochlorothiazide at this time.  Continue amlodipine. ?

## 2021-11-08 NOTE — Patient Instructions (Addendum)
Medication Instructions:  ?START SPIRONOLACTONE 25 MG DAILY  ? ?START METOPROLOL SUCC 25 MG DAILY  ? ?*If you need a refill on your cardiac medications before your next appointment, please call your pharmacy* ? ?Lab Work: ?BMET IN 1 WEEK  ? ?If you have labs (blood work) drawn today and your tests are completely normal, you will receive your results only by: ?MyChart Message (if you have MyChart) OR ?A paper copy in the mail ?If you have any lab test that is abnormal or we need to change your treatment, we will call you to review the results. ? ?Testing/Procedures: ?NONE ? ?Follow-Up: ?12/11/2021 AT 3:35 PM WITH CAITLIN W NP  ?VIRTUAL VISIT  ? ? ? ? ? ? ? ? ? ?

## 2021-11-08 NOTE — Assessment & Plan Note (Signed)
Symptoms are not well controlled.  She will work on getting an appointment with her rheumatologist. ?

## 2021-11-10 ENCOUNTER — Other Ambulatory Visit: Payer: Self-pay | Admitting: Family Medicine

## 2021-11-10 DIAGNOSIS — I10 Essential (primary) hypertension: Secondary | ICD-10-CM

## 2021-11-21 ENCOUNTER — Ambulatory Visit (HOSPITAL_BASED_OUTPATIENT_CLINIC_OR_DEPARTMENT_OTHER): Payer: No Typology Code available for payment source | Admitting: Cardiovascular Disease

## 2021-11-21 DIAGNOSIS — R002 Palpitations: Secondary | ICD-10-CM

## 2021-11-21 DIAGNOSIS — E669 Obesity, unspecified: Secondary | ICD-10-CM

## 2021-11-21 DIAGNOSIS — I10 Essential (primary) hypertension: Secondary | ICD-10-CM

## 2021-11-21 DIAGNOSIS — G4733 Obstructive sleep apnea (adult) (pediatric): Secondary | ICD-10-CM

## 2021-12-11 ENCOUNTER — Encounter (HOSPITAL_BASED_OUTPATIENT_CLINIC_OR_DEPARTMENT_OTHER): Payer: Self-pay | Admitting: Family

## 2021-12-11 ENCOUNTER — Telehealth (INDEPENDENT_AMBULATORY_CARE_PROVIDER_SITE_OTHER): Payer: No Typology Code available for payment source | Admitting: Family

## 2021-12-11 ENCOUNTER — Telehealth (HOSPITAL_BASED_OUTPATIENT_CLINIC_OR_DEPARTMENT_OTHER): Payer: Self-pay

## 2021-12-11 VITALS — BP 150/91 | Ht 61.0 in

## 2021-12-11 DIAGNOSIS — I872 Venous insufficiency (chronic) (peripheral): Secondary | ICD-10-CM

## 2021-12-11 DIAGNOSIS — R0609 Other forms of dyspnea: Secondary | ICD-10-CM | POA: Diagnosis not present

## 2021-12-11 DIAGNOSIS — R600 Localized edema: Secondary | ICD-10-CM

## 2021-12-11 DIAGNOSIS — I10 Essential (primary) hypertension: Secondary | ICD-10-CM | POA: Diagnosis not present

## 2021-12-11 NOTE — Telephone Encounter (Signed)
Rn attempted to call patient to schedule echo and 2 month adv HTN clinic follow up with either Duke Salvia or Dan Humphreys.

## 2021-12-11 NOTE — Progress Notes (Unsigned)
Virtual Visit via Video Note  Advanced Hypertension Clinic   Because of Sandra Vang's co-morbid illnesses, she is at least at moderate risk for complications without adequate follow up.  This format is felt to be most appropriate for this patient at this time.  All issues noted in this document were discussed and addressed.  A limited physical exam was performed with this format.  Please refer to the patient's chart for her consent to telehealth for [facility].       Date:  12/12/2021   ID:  Sandra Vang, DOB [birth year], MRN [id number] The patient was identified using 2 identifiers.  Patient Location: Home Provider Location: Office/Clinic   PCP:  [place], Betty G, MD   [facility] Providers Cardiologist:  Chilton Si, MD     Evaluation Performed:  Follow-Up Visit  Chief Complaint:  Advanced Hypertension Clinic follow up  History of Present Illness:    Sandra Vang is a 46 y.o. female with of hypertension, ADD, depression, psoriatic arthritis, OSA, systemic lupus erythematosus, and fibromyalgia.  Noted family predisposition to fibromuscular dysplasia.  First cousin and maternal uncle with aneurysms discovered on genetic testing.  Mother with vascular dementia.  Grandfather and uncle with MIs in their 16s.  Her mother and many of her siblings also have kidney disease.  Initially diagnosed with hypertension in 2016.  Initially seen July 2022 to establish with advanced hypertension clinic.  Initial visit blood pressure elevated but decreased on repeat and she was referred to prep.  She saw PCP 08/2020 noting palpitations and referred back to cardiology.  Monitor 08/2021 with normal sinus rhythm and rare PVC/PAC.  Last seen 11/08/21. Spironolactone was added due to hypokalemia and suboptimal BP control. As her Bp remained elevated, HCTZ was continued.   Follow up today via video visit. 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 Her daughter and  granddaughter live with her. She did complete sleep study but it did not record and needs to repeat. Has not yet picked up repeat study. She has previously been diagnosed with sleep apnea but not on CPAP for many years. BP at home 135/81, 133/86, 145/87, 150/90. No hypotensive readings. She is taking HCTZ, Metoprolol, Spironolactone but has not been taking Amlodipine due to edema. Does note she prefers natural supplements over medications. Has not yet had her repeat BMp done for monitoring. Palpitations less bothersome. She is very concerned regarding LE edema which resolves by morning given family history of HF. Notes exertional dyspnea which she endorses may be related to inactivity.   Past Medical History:  Diagnosis Date   ADD (attention deficit disorder)    B12 deficiency    Chest pain    Chronic headaches    Depression    Fibromyalgia    Gallbladder disease    Hypertension    Insomnia    Joint pain    Lactose intolerance    MRSA (methicillin resistant Staphylococcus aureus) infection    OSA (obstructive sleep apnea) 03/24/2015   Psoriatic arthritis (HCC)    Systemic lupus erythematosus (HCC) 10/01/2016   -seeing dermatology and rheumatologist (Dr. Kathi Ludwig)   Vitamin D deficiency    Past Surgical History:  Procedure Laterality Date   APPENDECTOMY  2005   CESAREAN SECTION     TUBAL LIGATION  1998     Current Meds  Medication Sig   hydrochlorothiazide (HYDRODIURIL) 25 MG tablet TAKE 1 TABLET (25 MG TOTAL) BY MOUTH DAILY.   metoprolol succinate (TOPROL XL) 25 MG  24 hr tablet Take 1 tablet (25 mg total) by mouth daily.   OVER THE COUNTER MEDICATION Turmeric 1500mg    OVER THE COUNTER MEDICATION Elderberry syrup with other herbs as needed   spironolactone (ALDACTONE) 25 MG tablet Take 1 tablet (25 mg total) by mouth daily.     Allergies:   Iodinated contrast media, Dexamethasone, Iohexol, Sulfa antibiotics, and Sulfasalazine   Social History   Tobacco Use   Smoking status: Never    Smokeless tobacco: Never  Vaping Use   Vaping Use: Never used  Substance Use Topics   Alcohol use: No    Alcohol/week: 0.0 standard drinks of alcohol   Drug use: No     Family Hx: The patient's family history includes Alcohol abuse in her maternal grandfather, maternal grandmother, and mother; Anxiety disorder in her father and mother; Arthritis in her maternal grandmother and paternal grandmother; Asthma in her brother, daughter, daughter, and paternal grandmother; Cancer in her maternal grandmother and paternal grandmother; Depression in her paternal aunt and paternal uncle; Drug abuse in her father; Emphysema in her father; Heart failure in her maternal grandmother and paternal grandmother; High Cholesterol in her father and mother; Hypertension in her father, maternal grandfather, maternal grandmother, mother, paternal grandfather, and paternal grandmother; Kidney disease in her mother; Multiple sclerosis in her brother.  ROS:   Please see the history of present illness.     All other systems reviewed and are negative.   Labs/Other Tests and Data Reviewed:    EKG:  No ECG reviewed.  Recent Labs: 08/10/2021: Vang 8; BUN 13; Creat 0.81; Potassium 3.8; Sodium 138; TSH 1.66 08/17/2021: Hemoglobin 12.1; Platelets 397   Recent Lipid Panel Lab Results  Component Value Date/Time   CHOL 227 (H) 08/10/2021 04:26 PM   CHOL 223 (H) 07/07/2019 12:56 PM   TRIG 80 08/10/2021 04:26 PM   HDL 78 08/10/2021 04:26 PM   HDL 81 07/07/2019 12:56 PM   CHOLHDL 2.9 08/10/2021 04:26 PM   LDLCALC 132 (H) 08/10/2021 04:26 PM    Wt Readings from Last 3 Encounters:  11/21/21 190 lb (86.2 kg)  11/08/21 196 lb 14.4 oz (89.3 kg)  08/22/21 189 lb (85.7 kg)    Objective:    Vital Signs:  BP (!) 150/91   Ht 5\' 1"  (1.549 m)   BMI 35.90 kg/m    VITAL SIGNS:  reviewed GEN:  no acute distress RESPIRATORY:  normal respiratory effort, symmetric expansion CARDIOVASCULAR:  no peripheral  edema  ASSESSMENT & PLAN:    HTN - BP not at goal of less than 130/80. Did not tolerate Amlodipine due to edema. She politely declines to transition Metoprolol to Carvedilol as she is concerned regarding possible side effect of fatigue. Unable to titrate Spironolactone/HCTZ as she has not yet had repeat labs done. BMP ordered for [facility] per her request to have collected this week. Discussed importance of 150 minutes of exercise per week and low sodium, heart healthy diet. She does have benefit through her workplace and provided letter requesting funds be allotted for walking pad for working from home. Free 2 week referral to [facility] ordered.   Dyspnea / LE edema - Dyspnea likely due to deconditioning. Le edema likely venous insufficiency as resolves by morning. Encouraged elevation, compression stockings. Will order echo to rule out HF due to family hx and hx of HTN.  ASCVD  - 10-year ASCVD risk score 3.2%. Primary prevention encouraged with diet and exercise.   OSA - Known prior history. Not  on CPAP. Unfortunately home sleep study did not record. She has not yet picked up repeat study and we discussed the importance of doing so in relation to her hypertension.     Time:   Today, I have spent 30 minutes with the patient with telehealth technology discussing the above problems.     Medication Adjustments/Labs and Tests Ordered: Current medicines are reviewed at length with the patient today.  Concerns regarding medicines are outlined above.   Tests Ordered: Orders Placed This Encounter  Procedures   Basic metabolic panel   Amb[facility]   ECHOCARDIOGRAM COMPLETE    Medication Changes: No orders of the defined types were placed in this encounter.   Follow Up:  In Person in 2 month(s) with Dr. Duke Salviaandolph or Alver Sorrowaitlin S Chaia Ikard, NP in Advanced Hypertension Clinic  Signed, Alver Sorrowaitlin S Shaunita Seney, NP  12/12/2021 10:02 AM    [facility]

## 2021-12-11 NOTE — Patient Instructions (Signed)
Medication Instructions:  Continue your current medications.   We will consider adjusting your medications based on your lab results.    *If you need a refill on your cardiac medications before your next appointment, please call your pharmacy*   Lab Work: Your physician recommends that you return for lab work this week or next at [facility] for BMP. You do not need to be fasting.   [facility] lab: [address], [place], [place] [address] Hours 7 AM - 5 PM Phone: [phone]  Testing/Procedures: Your physician has requested that you have an echocardiogram. Echocardiography is a painless test that uses sound waves to create images of your heart. It provides your doctor with information about the size and shape of your heart and how well your heart's chambers and valves are working. This procedure takes approximately one hour. There are no restrictions for this procedure.    Follow-Up: At [facility], you and your health needs are our priority.  As part of our continuing mission to provide you with exceptional heart care, we have created designated Provider Care Teams.  These Care Teams include your primary Cardiologist (physician) and Advanced Practice Providers (APPs -  Physician Assistants and Nurse Practitioners) who all work together to provide you with the care you need, when you need it.  We recommend signing up for the patient portal called "MyChart".  Sign up information is provided on this After Visit Summary.  MyChart is used to connect with patients for Virtual Visits (Telemedicine).  Patients are able to view lab/test results, encounter notes, upcoming appointments, etc.  Non-urgent messages can be sent to your provider as well.   To learn more about what you can do with MyChart, go to [email or web address].    Your next appointment:   2 month(s)  The format for your next appointment:   In Person  Provider:   Chilton Si, MD or Alver Sorrow, NP   In Advanced  Hypertension Clinic  Other Instructions  Call Behavioral Health to schedule your appointment. They did receive your referral. Their phone number is [phone].  Heart Healthy Diet Recommendations: A low-salt diet is recommended. Meats should be grilled, baked, or boiled. Avoid fried foods. Focus on lean protein sources like fish or chicken with vegetables and fruits. The American Heart Association is a Chief Technology Officer!  American Heart Association Diet and Lifeystyle Recommendations   Exercise recommendations: The American Heart Association recommends 150 minutes of moderate intensity exercise weekly. Try 30 minutes of moderate intensity exercise 4-5 times per week. This could include walking, jogging, or swimming. We have sent a referral for you to have a 2 week free trial at [facility].

## 2021-12-12 ENCOUNTER — Encounter (HOSPITAL_BASED_OUTPATIENT_CLINIC_OR_DEPARTMENT_OTHER): Payer: Self-pay | Admitting: Family

## 2021-12-22 ENCOUNTER — Telehealth (HOSPITAL_COMMUNITY): Payer: No Typology Code available for payment source | Admitting: Psychiatry

## 2021-12-25 ENCOUNTER — Ambulatory Visit (HOSPITAL_COMMUNITY): Payer: No Typology Code available for payment source | Attending: Internal Medicine

## 2021-12-25 DIAGNOSIS — R600 Localized edema: Secondary | ICD-10-CM

## 2021-12-25 DIAGNOSIS — R0609 Other forms of dyspnea: Secondary | ICD-10-CM | POA: Insufficient documentation

## 2021-12-25 DIAGNOSIS — I10 Essential (primary) hypertension: Secondary | ICD-10-CM | POA: Diagnosis present

## 2021-12-25 LAB — ECHOCARDIOGRAM COMPLETE
Area-P 1/2: 5.27 cm2
S' Lateral: 2.5 cm

## 2021-12-26 ENCOUNTER — Telehealth (HOSPITAL_BASED_OUTPATIENT_CLINIC_OR_DEPARTMENT_OTHER): Payer: Self-pay

## 2021-12-26 DIAGNOSIS — I10 Essential (primary) hypertension: Secondary | ICD-10-CM

## 2021-12-26 NOTE — Telephone Encounter (Addendum)
Results called to patient who verbalizes understanding, labs ordered.      ----- Message from Alver Sorrow, NP sent at 12/26/2021  8:04 AM EDT ----- Echo with normal heart muscle function. No significant valvular abnormalities. No evidence of heart failure.   Also - please remind her to have BMP collected as discussed at her last 2 visits. Please add on CBC, thyroid panel due to some increased flow velocities.

## 2022-01-16 ENCOUNTER — Ambulatory Visit (HOSPITAL_BASED_OUTPATIENT_CLINIC_OR_DEPARTMENT_OTHER)
Payer: No Typology Code available for payment source | Attending: Cardiovascular Disease | Admitting: Cardiovascular Disease

## 2022-01-23 ENCOUNTER — Encounter: Payer: Self-pay | Admitting: Family Medicine

## 2022-01-23 ENCOUNTER — Encounter (HOSPITAL_BASED_OUTPATIENT_CLINIC_OR_DEPARTMENT_OTHER): Payer: Self-pay | Admitting: Cardiovascular Disease

## 2022-01-23 DIAGNOSIS — Z1211 Encounter for screening for malignant neoplasm of colon: Secondary | ICD-10-CM

## 2022-01-28 ENCOUNTER — Ambulatory Visit (HOSPITAL_BASED_OUTPATIENT_CLINIC_OR_DEPARTMENT_OTHER)
Payer: No Typology Code available for payment source | Attending: Cardiovascular Disease | Admitting: Cardiovascular Disease

## 2022-01-28 DIAGNOSIS — Z6834 Body mass index (BMI) 34.0-34.9, adult: Secondary | ICD-10-CM | POA: Insufficient documentation

## 2022-01-28 DIAGNOSIS — G4733 Obstructive sleep apnea (adult) (pediatric): Secondary | ICD-10-CM | POA: Diagnosis present

## 2022-01-28 DIAGNOSIS — I10 Essential (primary) hypertension: Secondary | ICD-10-CM | POA: Insufficient documentation

## 2022-01-28 DIAGNOSIS — R002 Palpitations: Secondary | ICD-10-CM | POA: Insufficient documentation

## 2022-02-01 ENCOUNTER — Ambulatory Visit (HOSPITAL_BASED_OUTPATIENT_CLINIC_OR_DEPARTMENT_OTHER)
Payer: No Typology Code available for payment source | Attending: Cardiovascular Disease | Admitting: Cardiovascular Disease

## 2022-02-01 DIAGNOSIS — G4733 Obstructive sleep apnea (adult) (pediatric): Secondary | ICD-10-CM

## 2022-02-01 DIAGNOSIS — R002 Palpitations: Secondary | ICD-10-CM | POA: Diagnosis not present

## 2022-02-01 DIAGNOSIS — I10 Essential (primary) hypertension: Secondary | ICD-10-CM | POA: Diagnosis not present

## 2022-02-04 ENCOUNTER — Other Ambulatory Visit (HOSPITAL_BASED_OUTPATIENT_CLINIC_OR_DEPARTMENT_OTHER): Payer: Self-pay | Admitting: Cardiovascular Disease

## 2022-02-04 NOTE — Telephone Encounter (Signed)
Rx(s) sent to pharmacy electronically.  

## 2022-02-06 ENCOUNTER — Encounter (INDEPENDENT_AMBULATORY_CARE_PROVIDER_SITE_OTHER): Payer: Self-pay

## 2022-02-10 ENCOUNTER — Encounter (HOSPITAL_BASED_OUTPATIENT_CLINIC_OR_DEPARTMENT_OTHER): Payer: Self-pay | Admitting: Cardiovascular Disease

## 2022-02-10 NOTE — Procedures (Signed)
Patient Name: Sandra Vang, Harts Date: 02/03/2022 Gender: Female D.O.B: April 30, 1976 Age (years): 46 Referring Provider: Chilton Si Height (inches): 61 Interpreting Physician: Nicki Guadalajara MD, ABSM Weight (lbs): 189 RPSGT: [place] BMI: 36 MRN: [id number] Neck Size: 14.00 <br> <br> CLINICAL INFORMATION Sleep Study Type: HST    Indication for sleep study: Snoring, HTN, obesity, OSA with previous CPAP initiated in 2016 (HST: AHI 11.4/h; O2 nadir 84%)    Epworth Sleepiness Score: 5  SLEEP STUDY TECHNIQUE A multi-channel overnight portable sleep study was performed. The channels recorded were: nasal airflow, thoracic respiratory movement, and oxygen saturation with a pulse oximetry. Snoring was also monitored.  MEDICATIONS Patient self administered medications include: N/A.  SLEEP ARCHITECTURE Patient was studied for 383.5 minutes. The sleep efficiency was 100.0 % and the patient was supine for 0%. The arousal index was 0.0 per hour.  RESPIRATORY PARAMETERS The overall AHI was 13.5 per hour, with a central apnea index of 0 per hour. Supine sleep was minimal at only 1.8 minutes.   The oxygen nadir was 89% during sleep.    CARDIAC DATA Mean heart rate during sleep was 71.2 bpm. Heart rate range 65 - 92 bpm.  IMPRESSIONS - Mild obstructive sleep apnea occurred during this study (AHI 13.5/h); however, supine sleep was very minimal.  - Mild oxygen desaturation to a nadir of 89%. - Patient snored for 82.3 minutes (21.5%) during the sleep. DIAGNOSIS - Obstructive Sleep Apnea (G47.33) RECOMMENDATIONS - Therapeutic CPAP titration to determine optimal pressure required to alleviate sleep disordered breathing. Recommend re-initiation of Auto-PAP with EPR of 3 at 7 - 16 cm of water. - Effort should be made to optimize nasal and oropharyngeal patency. - A customized oral appliance may be considered as an alternative to CPAP. - Avoid alcohol,  sedatives and other CNS depressants that may worsen sleep apnea and disrupt normal sleep architecture. - Sleep hygiene should be reviewed to assess factors that may improve sleep quality. - Weight management (BMI 36) and regular exercise should be initiated or continued. - Recommend a download and sleep clinic evaluation after 4 weeks of therapy. -  [Electronically signed] 02/10/2022 02:54 PM  Nicki Guadalajara MD, ABSM Diplomate, American Board of Sleep Medicine NPI: [id number] Patient Name: Sandra Vang, Sandra Vang Date: 02/03/2022 Gender: Female D.O.B: 06/28/1976 Age (years): 46 Referring Provider: Chilton Si Height (inches): 61 Interpreting Physician: Nicki Guadalajara MD, ABSM Weight (lbs): 189 RPSGT: [place] BMI: 36 MRN: [id number] Neck Size: 14.00  CLINICAL INFORMATION Sleep Study Type: HST  Indication for sleep study: Snoring, HTN, obesity, OSA with previous CPAP initiated in 2016 (HST: AHI 11.4/h; O2 nadir 84%; previous 95th percentile pressure 9 cm of water)  Epworth Sleepiness Score: 5  SLEEP STUDY TECHNIQUE A multi-channel overnight portable sleep study was performed. The channels recorded were: nasal airflow, thoracic respiratory movement, and oxygen saturation with a pulse oximetry. Snoring was also monitored.  MEDICATIONS hydrochlorothiazide (HYDRODIURIL) 25 MG tablet metoprolol succinate (TOPROL-XL) 25 MG 24 hr tablet OVER THE COUNTER MEDICATION OVER THE COUNTER MEDICATION spironolactone (ALDACTONE) 25 MG tablet Patient self administered medications include: N/A.  SLEEP ARCHITECTURE Patient was studied for 383.5 minutes. The sleep efficiency was 100.0 % and the patient was supine for 0%. The arousal index was 0.0 per hour.  RESPIRATORY PARAMETERS The overall AHI was 13.5 per hour, with a central apnea index of 0 per hour. Supine sleep was minimal at only 1.8 minutes.   The oxygen nadir was 89% during sleep.  CARDIAC DATA Mean  heart rate during  sleep was 71.2 bpm. Heart rate range 65 - 92 bpm.  IMPRESSIONS - Mild obstructive sleep apnea occurred during this study (AHI 13.5/h); however, supine sleep was very minimal, and the overall severity may be underestimated if more supine sleep was present. - Mild oxygen desaturation to a nadir of 89%. - Patient snored for 82.3 minutes (21.5%) during the sleep.  DIAGNOSIS - Obstructive Sleep Apnea (G47.33)  RECOMMENDATIONS - Therapeutic CPAP titration to determine optimal pressure required to alleviate sleep disordered breathing. Recommend re-initiation of Auto-PAP with EPR of 3 at 7 - 16 cm of water. - Effort should be made to optimize nasal and oropharyngeal patency. - A customized oral appliance may be considered as an alternative to CPAP. - Avoid alcohol, sedatives and other CNS depressants that may worsen sleep apnea and disrupt normal sleep architecture. - Sleep hygiene should be reviewed to assess factors that may improve sleep quality. - Weight management (BMI 36) and regular exercise should be initiated or continued. - Recommend a download and sleep clinic evaluation after 4 weeks of therapy.   [Electronically signed] 02/10/2022 02:54 PM  Nicki Guadalajara MD, [facility], ABSM Diplomate, American Board of Sleep Medicine  NPI: [id number]  [facility] PH: [phone]   FX: [phone] ACCREDITED BY THE AMERICAN ACADEMY OF SLEEP MEDICINE

## 2022-02-14 ENCOUNTER — Telehealth: Payer: Self-pay | Admitting: *Deleted

## 2022-02-14 NOTE — Telephone Encounter (Signed)
Patient notified of sleep study results and recommendations. She  agrees to proceed with MD recommendations.

## 2022-02-14 NOTE — Telephone Encounter (Signed)
-----   Message from Lennette Bihari, MD sent at 02/10/2022  3:00 PM EDT ----- Burna Mortimer, please notiify pt and initiate Auto-PAP with DME as recommended.

## 2022-02-14 NOTE — Telephone Encounter (Signed)
CPAP order sent to [facility] via parachute portal.

## 2022-02-19 ENCOUNTER — Ambulatory Visit (HOSPITAL_BASED_OUTPATIENT_CLINIC_OR_DEPARTMENT_OTHER): Payer: No Typology Code available for payment source | Admitting: Family

## 2022-03-06 ENCOUNTER — Encounter: Payer: Self-pay | Admitting: Family Medicine

## 2022-03-21 ENCOUNTER — Telehealth (HOSPITAL_BASED_OUTPATIENT_CLINIC_OR_DEPARTMENT_OTHER): Payer: Self-pay | Admitting: Family

## 2022-03-21 NOTE — Telephone Encounter (Signed)
Called to confirm patients appointment for 03/25/2022 with Laurann Montana NP. She is asking if she has to use the cpap for a certain amount of time before she see's Caitlin.

## 2022-03-25 ENCOUNTER — Ambulatory Visit (HOSPITAL_BASED_OUTPATIENT_CLINIC_OR_DEPARTMENT_OTHER): Payer: No Typology Code available for payment source | Admitting: Family

## 2022-04-02 IMAGING — CR DG CHEST 2V
1 series · 1 of 1 positions shown · non-contrast
Comparison: July 14, 2017

CLINICAL DATA: Chest pain.

EXAM:
CHEST - 2 VIEW

[w chest pa]
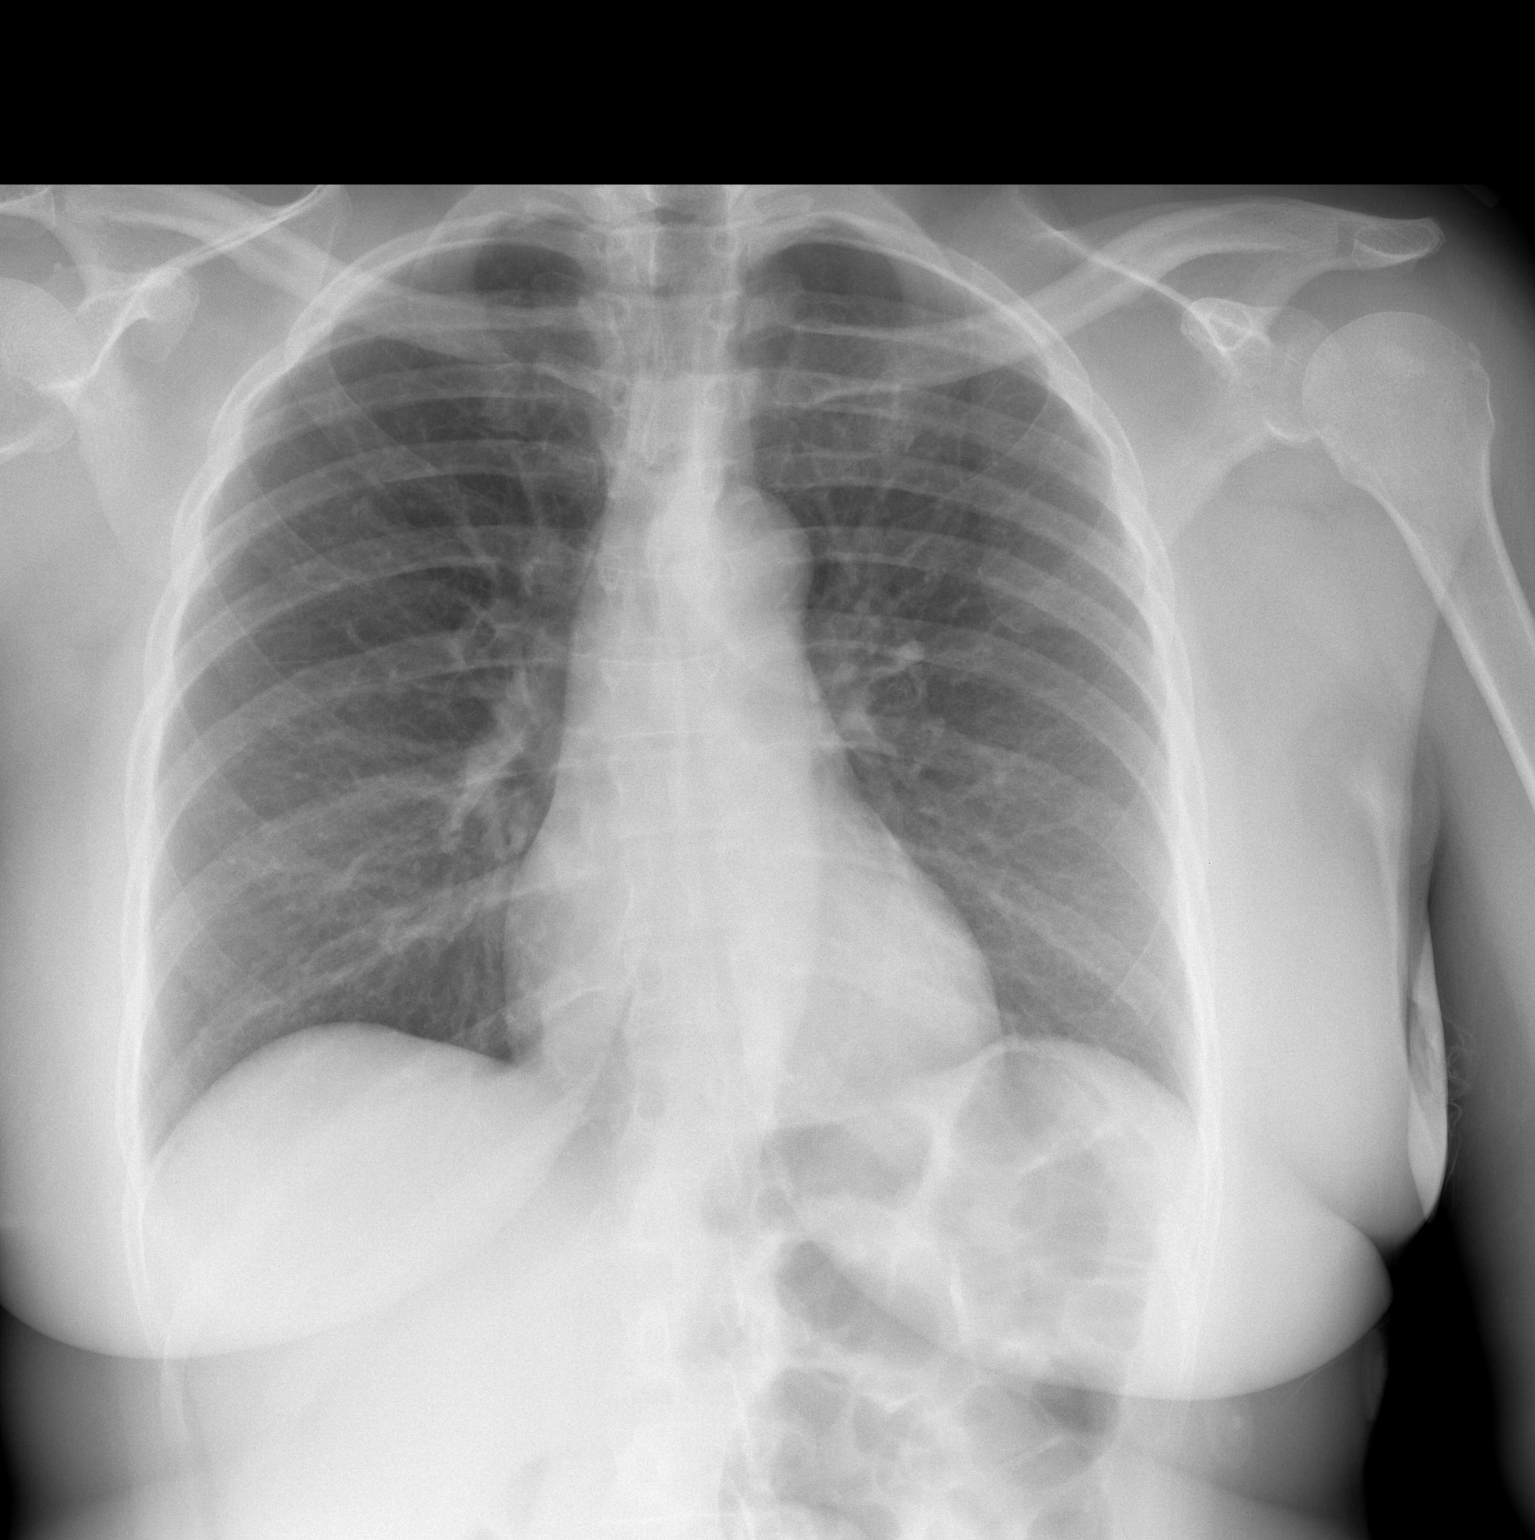

[1 of 1 positions shown; findings below may reference images not displayed]

FINDINGS: There is no evidence of acute infiltrate, pleural effusion or
pneumothorax. The heart size and mediastinal contours are within
normal limits. The visualized skeletal structures are unremarkable.
IMPRESSION: No active cardiopulmonary disease.

## 2022-04-02 IMAGING — CR DG SHOULDER 2+V*L*
3 series · 3 of 3 positions shown · non-contrast
Comparison: None.

CLINICAL DATA: Left shoulder pain for 2 days

EXAM:
LEFT SHOULDER - 2+ VIEW

[w shoulder grashey left]
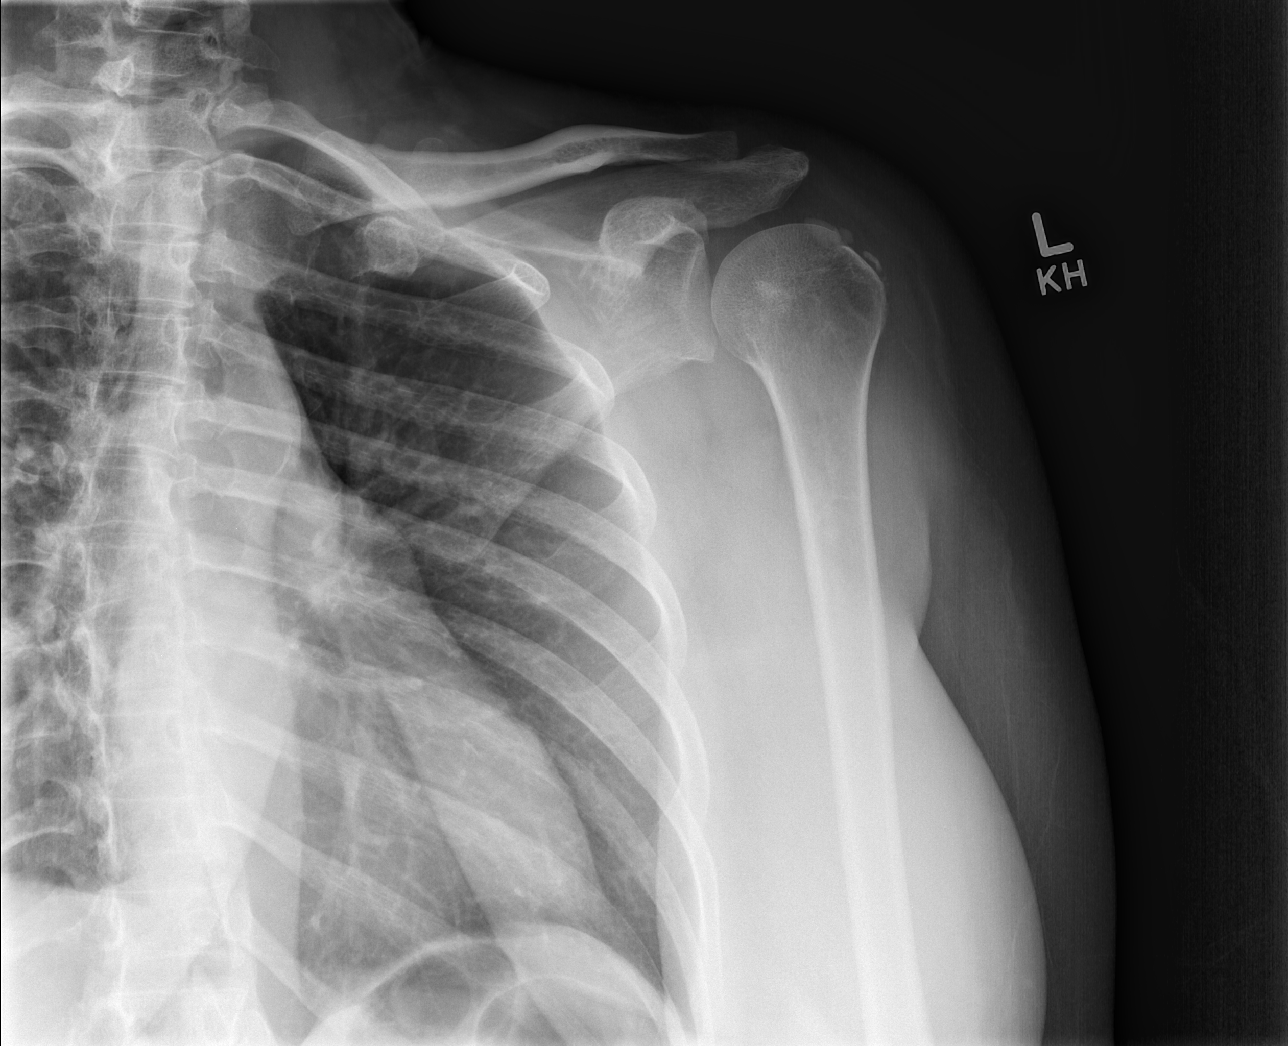

[w shoulder y view left]
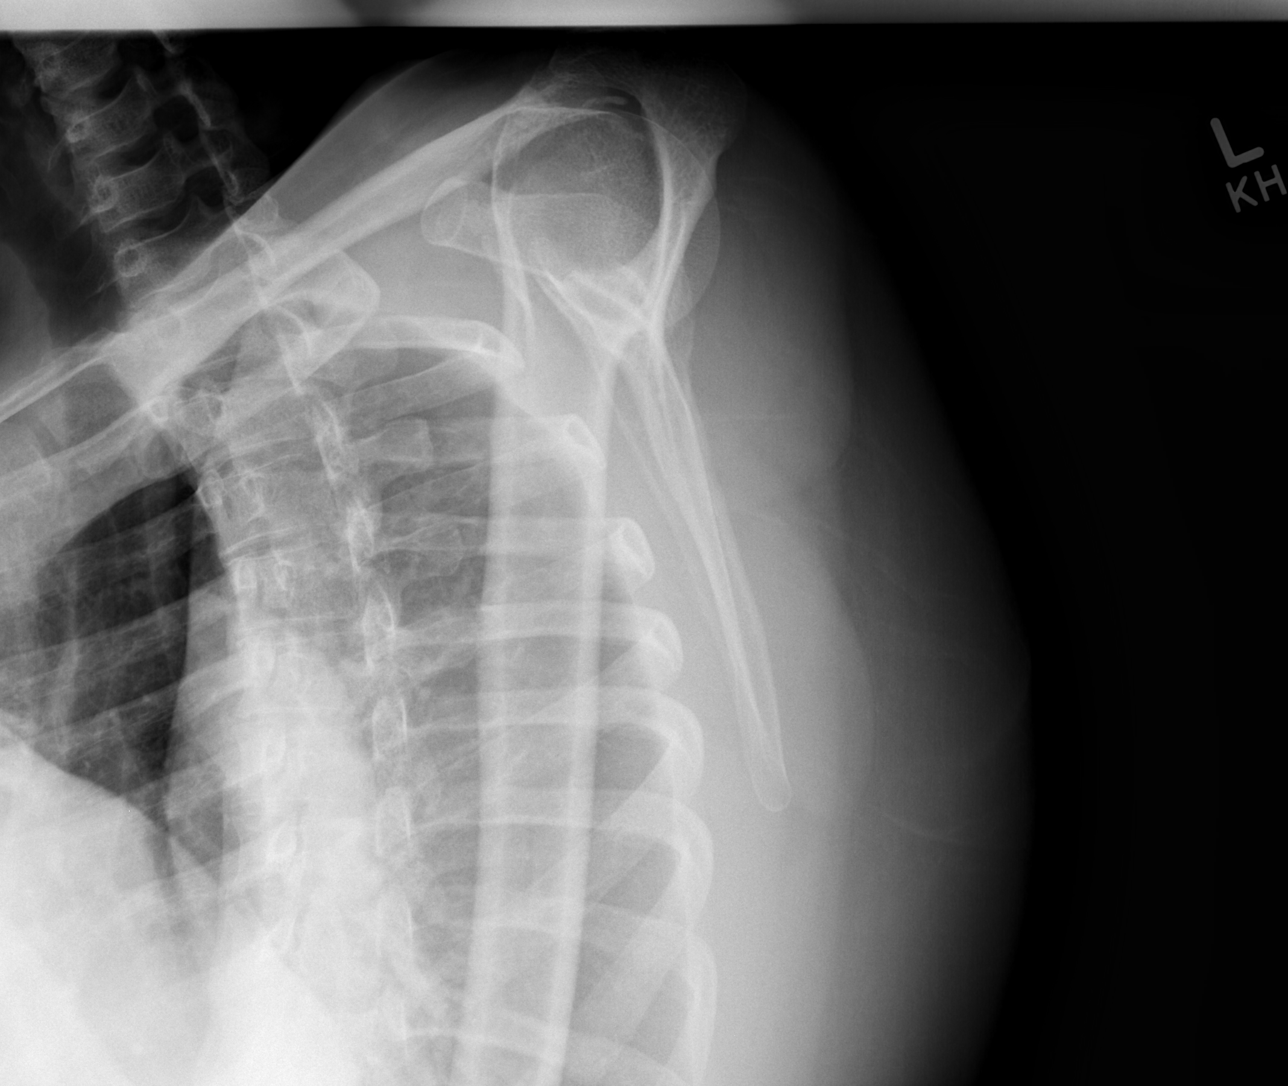

[x shoulder axillary left]
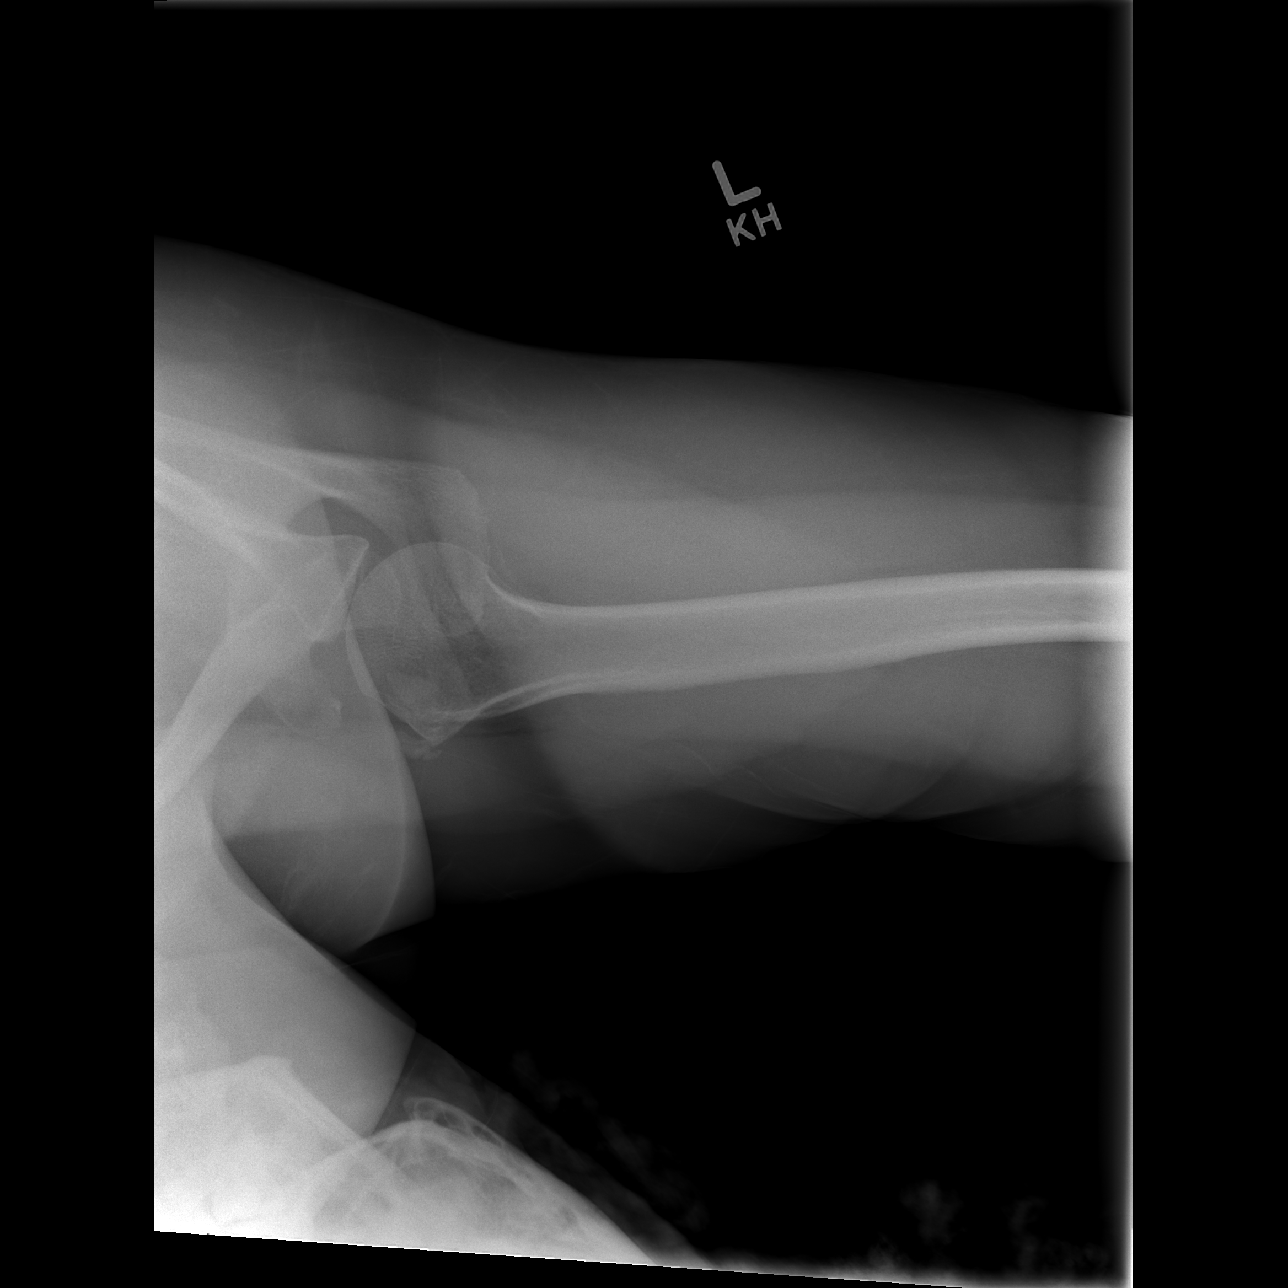

[3 of 3 positions shown; findings below may reference images not displayed]

FINDINGS: Frontal, transscapular, and axillary views of the left shoulder are
obtained. No fracture, subluxation, or dislocation. There is
calcific tendinopathy in the region of the supraspinatus tendon.
Joint spaces are well preserved. Left chest is clear.
IMPRESSION: 1. Calcific tendinopathy of the superior aspect of the rotator cuff,
likely within the supraspinatus.
2. No acute bony abnormality.

## 2022-05-03 ENCOUNTER — Encounter (HOSPITAL_BASED_OUTPATIENT_CLINIC_OR_DEPARTMENT_OTHER): Payer: Self-pay | Admitting: Family

## 2022-05-03 ENCOUNTER — Ambulatory Visit (INDEPENDENT_AMBULATORY_CARE_PROVIDER_SITE_OTHER): Payer: No Typology Code available for payment source | Admitting: Family

## 2022-05-03 VITALS — BP 139/93 | HR 72 | Ht 61.0 in | Wt 197.0 lb

## 2022-05-03 DIAGNOSIS — I10 Essential (primary) hypertension: Secondary | ICD-10-CM

## 2022-05-03 DIAGNOSIS — R600 Localized edema: Secondary | ICD-10-CM

## 2022-05-03 DIAGNOSIS — G4733 Obstructive sleep apnea (adult) (pediatric): Secondary | ICD-10-CM | POA: Diagnosis not present

## 2022-05-03 DIAGNOSIS — I872 Venous insufficiency (chronic) (peripheral): Secondary | ICD-10-CM | POA: Diagnosis not present

## 2022-05-03 NOTE — Patient Instructions (Signed)
Medication Instructions:  Your Physician recommend you continue on your current medication as directed.    *If you need a refill on your cardiac medications before your next appointment, please call your pharmacy*   Lab Work: Please have labs done, we have reordered them for the [place] labs.   If you have labs (blood work) drawn today and your tests are completely normal, you will receive your results only by: [place] (if you have MyChart) OR A paper copy in the mail If you have any lab test that is abnormal or we need to change your treatment, we will call you to review the results.  Follow-Up: At [facility], you and your health needs are our priority.  As part of our continuing mission to provide you with exceptional heart care, we have created designated Provider Care Teams.  These Care Teams include your primary Cardiologist (physician) and Advanced Practice Providers (APPs -  Physician Assistants and Nurse Practitioners) who all work together to provide you with the care you need, when you need it.  We recommend signing up for the patient portal called "MyChart".  Sign up information is provided on this After Visit Summary.  MyChart is used to connect with patients for Virtual Visits (Telemedicine).  Patients are able to view lab/test results, encounter notes, upcoming appointments, etc.  Non-urgent messages can be sent to your provider as well.   To learn more about what you can do with MyChart, go to [email or web address].    Your next appointment:   Follow up as scheduled with Dr. Oval Linsey   Other Instructions Please start wearing CPAP.   We have referred you to Vascular for Venous Insufficiency.   Nebivolol Tablets What is this medication? NEBIVOLOL (ne BIV oh lole) treats high blood pressure. It works by lowering your blood pressure and heart rate, making it easier for your heart to pump blood to the rest of your body. It belongs to a group of medications  called beta blockers. This medicine may be used for other purposes; ask your health care provider or pharmacist if you have questions. COMMON BRAND NAME(S): Bystolic What should I tell my care team before I take this medication? They need to know if you have any of these conditions: Diabetes Heart or blood vessel disease like slow heart rate, worsening heart failure, heart block, sick sinus syndrome, or Raynaud's disease Kidney disease Liver disease Lung or breathing disease like asthma or emphysema Pheochromocytoma Thyroid disease An unusual or allergic reaction to nebivolol, other beta blockers, medications, foods, dyes, or preservatives Pregnant or trying to get pregnant Breast-feeding How should I use this medication? Take this medication by mouth. Take it as directed on the prescription label at the same time every day. Keep taking it unless your care team tells you to stop. Talk to your care team about the use of this medication in children. Special care may be needed. Overdosage: If you think you have taken too much of this medicine contact a poison control center or emergency room at once. NOTE: This medicine is only for you. Do not share this medicine with others. What if I miss a dose? If you miss a dose, take it as soon as you can. If it is almost time for your next dose, take only that dose. Do not take double or extra doses. What may interact with this medication? This medication may interact with the following: Certain medications for blood pressure, heart disease, irregular heartbeat Certain medications for depression,  like fluoxetine or paroxetine Cimetidine Clonidine Reserpine Sildenafil This list may not describe all possible interactions. Give your health care provider a list of all the medicines, herbs, non-prescription drugs, or dietary supplements you use. Also tell them if you smoke, drink alcohol, or use illegal drugs. Some items may interact with your  medicine. What should I watch for while using this medication? Visit your care team for regular checks on your progress. Check your blood pressure as directed. Ask your care team what your blood pressure should be. Also, find out when you should contact them. Do not treat yourself for coughs, colds, or pain while you are using this medication without asking your care team for advice. Some medications may increase your blood pressure. You may get drowsy or dizzy. Do not drive, use machinery, or do anything that needs mental alertness until you know how this medication affects you. Do not stand up or sit up quickly, especially if you are an older patient. This reduces the risk of dizzy or fainting spells. Alcohol may interfere with the effect of this medication. Avoid alcoholic drinks. This medication may increase blood sugar. Ask your care team if changes in diet or medications are needed if you have diabetes. What side effects may I notice from receiving this medication? Side effects that you should report to your care team as soon as possible: Allergic reactions--skin rash, itching, hives, swelling of the face, lips, tongue, or throat Heart failure--shortness of breath, swelling of the ankles, feet, or hands, sudden weight gain, unusual weakness or fatigue Low blood pressure--dizziness, feeling faint or lightheaded, blurry vision Raynaud's--cool, numb, or painful fingers or toes that may change color from pale, to blue, to red Slow heartbeat--dizziness, feeling faint or lightheaded, confusion, trouble breathing, unusual weakness or fatigue Worsening mood, feelings of depression Side effects that usually do not require medical attention (report to your care team if they continue or are bothersome): Change in sex drive or performance Diarrhea Dizziness Fatigue Headache This list may not describe all possible side effects. Call your doctor for medical advice about side effects. You may report side  effects to FDA at 1-800-FDA-1088. Where should I keep my medication? Keep out of the reach of children and pets. Store at room temperature between 20 and 25 degrees C (68 and 77 degrees F). Throw away any unused medication after the expiration date. NOTE: This sheet is a summary. It may not cover all possible information. If you have questions about this medicine, talk to your doctor, pharmacist, or health care provider.  2023 Elsevier/Gold Standard (2020-08-11 00:00:00)

## 2022-05-03 NOTE — Progress Notes (Unsigned)
Virtual Visit via Telephone Note   Because of Azadeh Krage's co-morbid illnesses, she is at least at moderate risk for complications without adequate follow up.  This format is felt to be most appropriate for this patient at this time.  The patient did not have access to video technology/had technical difficulties with video requiring transitioning to audio format only (telephone).  All issues noted in this document were discussed and addressed.  No physical exam could be performed with this format.  Please refer to the patient's chart for her consent to telehealth for [facility].    Date:  05/04/2022   ID:  Sandra Vang, DOB 06/17/1976, MRN [id number] The patient was identified using 2 identifiers.  Patient Location: Home Provider Location: Office/Clinic   PCP:  [place], Betty G, MD   [facility] Providers Cardiologist:  Chilton Si, MD     Evaluation Performed:  Follow-Up Visit  Chief Complaint:  HTN  History of Present Illness:    Sandra Vang is a 46 y.o. female with hypertension, ADD, depression, psoriatic arthritis, OSA, systemic lupus erythematosus, and fibromyalgia.  Noted family predisposition to fibromuscular dysplasia.  First cousin and maternal uncle with aneurysms discovered on genetic testing.  Mother with vascular dementia.  Grandfather and uncle with MIs in their 15s.  Her mother and many of her siblings also have kidney disease.   Initially diagnosed with hypertension in 2016.  Initially seen July 2022 to establish with advanced hypertension clinic.  Initial visit blood pressure elevated but decreased on repeat and she was referred to prep.  She saw PCP 08/2020 noting palpitations and referred back to cardiology.  Monitor 08/2021 with normal sinus rhythm and rare PVC/PAC.   Seen 11/08/21. Spironolactone was added due to hypokalemia and suboptimal BP control. As her Bp remained elevated, HCTZ was continued.   Last phone visit 12/11/21  did not tolerate AMlodipine due to edema. She declined to transition Metoprolol to Carvedilol due to concern for fatigue. Unable to titrate Spironolactone/HCTZ as she had not had labs done. Recommended to follow up in 2 months. Echo 11/2021 normal LVEF 65-60%, mild LVH, no significant valvular abnormalities.    Follow up today via phone visit. She has not yet had her labs (BMP) as ordered 10/2021 and 11/2021.  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 Her daughter and granddaughter live with her. Does note she prefers natural supplements over medications. LE edema worse at end of day though does sit in dependent position and not wearing compression stockings. BP at home not at goal <130/80. She has obtained CPAP but not yet started using.   Past Medical History:  Diagnosis Date   ADD (attention deficit disorder)    B12 deficiency    Chest pain    Chronic headaches    Depression    Fibromyalgia    Gallbladder disease    Hypertension    Insomnia    Joint pain    Lactose intolerance    MRSA (methicillin resistant Staphylococcus aureus) infection    OSA (obstructive sleep apnea) 03/24/2015   Psoriatic arthritis (HCC)    Systemic lupus erythematosus (HCC) 10/01/2016   -seeing dermatology and rheumatologist (Dr. Kathi Ludwig)   Vitamin D deficiency    Past Surgical History:  Procedure Laterality Date   APPENDECTOMY  2005   CESAREAN SECTION     TUBAL LIGATION  1998     Current Meds  Medication Sig   hydrochlorothiazide (HYDRODIURIL) 25 MG tablet TAKE 1 TABLET (  25 MG TOTAL) BY MOUTH DAILY.   metoprolol succinate (TOPROL-XL) 25 MG 24 hr tablet TAKE 1 TABLET (25 MG TOTAL) BY MOUTH DAILY.   spironolactone (ALDACTONE) 25 MG tablet Take 1 tablet (25 mg total) by mouth daily.     Allergies:   Iodinated contrast media, Dexamethasone, Iohexol, Sulfa antibiotics, and Sulfasalazine   Social History   Tobacco Use   Smoking status: Never   Smokeless tobacco: Never  Vaping Use   Vaping Use:  Never used  Substance Use Topics   Alcohol use: No    Alcohol/week: 0.0 standard drinks of alcohol   Drug use: No     Family Hx: The patient's family history includes Alcohol abuse in her maternal grandfather, maternal grandmother, and mother; Anxiety disorder in her father and mother; Arthritis in her maternal grandmother and paternal grandmother; Asthma in her brother, daughter, daughter, and paternal grandmother; Cancer in her maternal grandmother and paternal grandmother; Depression in her paternal aunt and paternal uncle; Drug abuse in her father; Emphysema in her father; Heart failure in her maternal grandmother and paternal grandmother; High Cholesterol in her father and mother; Hypertension in her father, maternal grandfather, maternal grandmother, mother, paternal grandfather, and paternal grandmother; Kidney disease in her mother; Multiple sclerosis in her brother.  ROS:   Please see the history of present illness.     All other systems reviewed and are negative.   Prior CV studies:   The following studies were reviewed today:  Echo 12/25/21  1. Left ventricular ejection fraction, by estimation, is 65 to 70%. The  left ventricle has normal function. The left ventricle has no regional  wall motion abnormalities. There is mild asymmetric left ventricular  hypertrophy of the septal segment. Left  ventricular diastolic parameters were normal. The average left ventricular  global longitudinal strain is -27.4 %. The global longitudinal strain is  normal.   2. Right ventricular systolic function is normal. The right ventricular  size is normal. There is normal pulmonary artery systolic pressure. The  estimated right ventricular systolic pressure is 28.0 mmHg.   3. The mitral valve is normal in structure. Trivial mitral valve  regurgitation. No evidence of mitral stenosis.   4. The aortic valve is tricuspid. Aortic valve regurgitation is not  visualized. No aortic stenosis is  present.   5. The inferior vena cava is normal in size with greater than 50%  respiratory variability, suggesting right atrial pressure of 3 mmHg.   6. Increased flow velocities may be secondary to anemia, thyrotoxicosis,  hyperdynamic or high flow state.  Labs/Other Tests and Data Reviewed:    EKG:  No ECG reviewed.  Recent Labs: 08/10/2021: Vang 8; BUN 13; Creat 0.81; Potassium 3.8; Sodium 138; TSH 1.66 08/17/2021: Hemoglobin 12.1; Platelets 397   Recent Lipid Panel Lab Results  Component Value Date/Time   CHOL 227 (H) 08/10/2021 04:26 PM   CHOL 223 (H) 07/07/2019 12:56 PM   TRIG 80 08/10/2021 04:26 PM   HDL 78 08/10/2021 04:26 PM   HDL 81 07/07/2019 12:56 PM   CHOLHDL 2.9 08/10/2021 04:26 PM   LDLCALC 132 (H) 08/10/2021 04:26 PM    Wt Readings from Last 3 Encounters:  05/03/22 197 lb (89.4 kg)  02/01/22 190 lb (86.2 kg)  01/28/22 190 lb (86.2 kg)       Objective:    Vital Signs:  BP (!) 139/93 Comment: left arm  Pulse 72   Ht 5\' 1"  (1.549 m)   Wt 197 lb (89.4 kg)  BMI 37.22 kg/m    VITAL SIGNS:  reviewed  ASSESSMENT & PLAN:    HTN - BP not at goal of less than 130/80. Did not tolerate Amlodipine due to edema. She politely declines to transition Metoprolol to Carvedilol or Nebivolol as she is concerned regarding possible side effect of fatigue. Unable to titrate Spironolactone/HCTZ as she has not yet had repeat labs done. Again reminded her to have labs done as ordered 10/2021 and 11/2021.  Discussed importance of 150 minutes of exercise per week and low sodium, heart healthy diet.    LE edema - Echo 11/2021 normal LVEF, mild LVH, no significant valvular abnormality. Likely etiology venous insufficiency. Referred to VVS per her request. Encouraged elevation, compression stockings.    ASCVD  - 10-year ASCVD risk score 3.2%. Primary prevention encouraged with diet and exercise.    OSA - Now has CPAP but not using, encouraged to use.         Time:   Today, I have  spent 9 minutes with the patient with telehealth technology discussing the above problems.     Medication Adjustments/Labs and Tests Ordered: Current medicines are reviewed at length with the patient today.  Concerns regarding medicines are outlined above.   Tests Ordered: Orders Placed This Encounter  Procedures   Thyroid Panel With TSH   Basic metabolic panel   CBC   Ambulatory referral to Vascular Surgery    Medication Changes: No orders of the defined types were placed in this encounter.   Follow Up:  In Person in 3 month(s)  Signed, Loel Dubonnet, NP  05/04/2022 11:10 AM    [place]

## 2022-05-04 ENCOUNTER — Encounter (HOSPITAL_BASED_OUTPATIENT_CLINIC_OR_DEPARTMENT_OTHER): Payer: Self-pay | Admitting: Family

## 2022-05-07 ENCOUNTER — Telehealth: Payer: Self-pay

## 2022-05-07 NOTE — Telephone Encounter (Signed)
Transition Care Management Unsuccessful Follow-up Telephone Call  Date of discharge and from where:  05/07/22 from [facility] ED for Shortness of breath  Attempts:  1st Attempt  Reason for unsuccessful TCM follow-up call:  Left voice message  If pt calls back please transfer to [facility]

## 2022-05-08 ENCOUNTER — Telehealth: Payer: Self-pay | Admitting: Family Medicine

## 2022-05-08 ENCOUNTER — Encounter (HOSPITAL_BASED_OUTPATIENT_CLINIC_OR_DEPARTMENT_OTHER): Payer: Self-pay | Admitting: Cardiovascular Disease

## 2022-05-08 DIAGNOSIS — Z5181 Encounter for therapeutic drug level monitoring: Secondary | ICD-10-CM

## 2022-05-08 DIAGNOSIS — R600 Localized edema: Secondary | ICD-10-CM

## 2022-05-08 DIAGNOSIS — I872 Venous insufficiency (chronic) (peripheral): Secondary | ICD-10-CM

## 2022-05-08 DIAGNOSIS — R0609 Other forms of dyspnea: Secondary | ICD-10-CM

## 2022-05-08 DIAGNOSIS — I10 Essential (primary) hypertension: Secondary | ICD-10-CM

## 2022-05-08 NOTE — Telephone Encounter (Signed)
Called Pt , no answer, LVM for her to call cardiologist.

## 2022-05-08 NOTE — Telephone Encounter (Signed)
These labs were ordered by her cardiologist, so they will have to change them.

## 2022-05-08 NOTE — Telephone Encounter (Signed)
Pt is coming in on Friday 05/10/22 for LABS and would like a CMP, instead of the BMP.  Is PCP able to amend the order before Friday?  Please advise.

## 2022-05-10 ENCOUNTER — Other Ambulatory Visit: Payer: No Typology Code available for payment source

## 2022-05-10 ENCOUNTER — Telehealth: Payer: Self-pay | Admitting: Family Medicine

## 2022-05-10 DIAGNOSIS — R5383 Other fatigue: Secondary | ICD-10-CM

## 2022-05-10 NOTE — Telephone Encounter (Signed)
In addition to the labs already ordered patient asks that you test inflammatory markers (ESR, reactive protein) because she has been feeling very tired and achy lately. She has a lab appointment with [place] on 05/14/22

## 2022-05-13 NOTE — Telephone Encounter (Signed)
Orders placed.

## 2022-05-13 NOTE — Telephone Encounter (Signed)
Ok to order labs, Dx: Fatigue. Thanks, BJ

## 2022-05-13 NOTE — Addendum Note (Signed)
Addended by: Kathreen Devoid on: 05/13/2022 10:23 AM   Modules accepted: Orders

## 2022-05-14 ENCOUNTER — Encounter (HOSPITAL_BASED_OUTPATIENT_CLINIC_OR_DEPARTMENT_OTHER): Payer: Self-pay

## 2022-05-14 ENCOUNTER — Telehealth (HOSPITAL_BASED_OUTPATIENT_CLINIC_OR_DEPARTMENT_OTHER): Payer: Self-pay

## 2022-05-14 ENCOUNTER — Other Ambulatory Visit (INDEPENDENT_AMBULATORY_CARE_PROVIDER_SITE_OTHER): Payer: No Typology Code available for payment source

## 2022-05-14 DIAGNOSIS — R600 Localized edema: Secondary | ICD-10-CM

## 2022-05-14 DIAGNOSIS — I10 Essential (primary) hypertension: Secondary | ICD-10-CM

## 2022-05-14 DIAGNOSIS — R5383 Other fatigue: Secondary | ICD-10-CM | POA: Diagnosis not present

## 2022-05-14 DIAGNOSIS — Z5181 Encounter for therapeutic drug level monitoring: Secondary | ICD-10-CM

## 2022-05-14 DIAGNOSIS — R0609 Other forms of dyspnea: Secondary | ICD-10-CM

## 2022-05-14 LAB — CBC
HCT: 37.6 % (ref 36.0–46.0)
Hemoglobin: 12 g/dL (ref 12.0–15.0)
MCHC: 32.1 g/dL (ref 30.0–36.0)
MCV: 85.9 fl (ref 78.0–100.0)
Platelets: 430 10*3/uL — ABNORMAL HIGH (ref 150.0–400.0)
RBC: 4.37 Mil/uL (ref 3.87–5.11)
RDW: 13.9 % (ref 11.5–15.5)
WBC: 8.8 10*3/uL (ref 4.0–10.5)

## 2022-05-14 LAB — LIPID PANEL
Cholesterol: 195 mg/dL (ref 0–200)
HDL: 64.2 mg/dL (ref >39.00)
LDL Cholesterol: 120 mg/dL — ABNORMAL HIGH (ref 0–99)
NonHDL: 131.09
Total CHOL/HDL Ratio: 3
Triglycerides: 56 mg/dL (ref 0.0–149.0)
VLDL: 11.2 mg/dL (ref 0.0–40.0)

## 2022-05-14 LAB — COMPREHENSIVE METABOLIC PANEL
ALT: 11 U/L (ref 0–35)
AST: 18 U/L (ref 0–37)
Albumin: 4.2 g/dL (ref 3.5–5.2)
Alkaline Phosphatase: 80 U/L (ref 39–117)
BUN: 17 mg/dL (ref 6–23)
CO2: 28 mEq/L (ref 19–32)
Calcium: 8.8 mg/dL (ref 8.4–10.5)
Chloride: 101 mEq/L (ref 96–112)
Creatinine, Ser: 0.84 mg/dL (ref 0.40–1.20)
GFR: 83.22 mL/min (ref >60.00)
Glucose, Bld: 107 mg/dL — ABNORMAL HIGH (ref 70–99)
Potassium: 3.9 mEq/L (ref 3.5–5.1)
Sodium: 135 mEq/L (ref 135–145)
Total Bilirubin: 0.3 mg/dL (ref 0.2–1.2)
Total Protein: 7.2 g/dL (ref 6.0–8.3)

## 2022-05-14 LAB — SEDIMENTATION RATE: Sed Rate: 58 mm/hr — ABNORMAL HIGH (ref 0–20)

## 2022-05-14 LAB — THYROID PANEL WITH TSH
Free Thyroxine Index: 2 (ref 1.4–3.8)
T3 Uptake: 30 % (ref 22–35)
T4, Total: 6.5 ug/dL (ref 5.1–11.9)
TSH: 3.22 mIU/L

## 2022-05-14 LAB — C-REACTIVE PROTEIN: CRP: <1 mg/dL (ref 0.5–20.0)

## 2022-05-14 LAB — HEMOGLOBIN A1C: Hgb A1c MFr Bld: 6 % (ref 4.6–6.5)

## 2022-05-14 MED ORDER — SPIRONOLACTONE 50 MG PO TABS: 50.0000 mg | ORAL_TABLET | Freq: Every day | ORAL | 3 refills | Status: DC

## 2022-05-14 MED ORDER — HYDROCHLOROTHIAZIDE 25 MG PO TABS: 25.0000 mg | ORAL_TABLET | Freq: Every day | ORAL | 3 refills | Status: DC

## 2022-05-14 NOTE — Telephone Encounter (Signed)
Patient returned call to the office, Results called to patient who verbalizes understanding! Rx updated and labs ordered for future so patient can have done at her PCP.

## 2022-05-14 NOTE — Addendum Note (Signed)
Addended by: Marlene Lard on: 05/14/2022 04:55 PM   Modules accepted: Orders

## 2022-05-14 NOTE — Telephone Encounter (Signed)
Labs reordered as future so that PCP can draw them

## 2022-05-14 NOTE — Telephone Encounter (Addendum)
Attempted call to patient, she was currently in a meeting, she will call the office back for her results.    ----- Message from Alver Sorrow, NP sent at 05/14/2022  1:49 PM EST ----- Normal kidneys, liver, electrolytes.  LDL (bad cholesterol) of 120 which is elevated. The 10-year ASCVD risk score (Arnett DK, et al., 2019) is: 2.7% Recommend increasing physical activity and following heart healthy diet.  CBC with no anemia nor infection.  Platelets mildly elevated similar compared to previous.  A1c of 6 which is in prediabetic range.  Increase Spironolactone to 50mg  daily for blood pressure/edema and repeat BMP in 1-2 weeks.

## 2022-05-15 ENCOUNTER — Telehealth (HOSPITAL_BASED_OUTPATIENT_CLINIC_OR_DEPARTMENT_OTHER): Payer: Self-pay | Admitting: *Deleted

## 2022-05-15 NOTE — Telephone Encounter (Signed)
Patient sent mychart message in about having chest pain and dizziness  Chest pains brought on with exertion and stress  Does have a bad left shoulder Dizziness can come on without exertion. Advised to check blood pressure during dizziness, log and bring to appointment next week   Describes pain as left sided chest pain, started few weeks ago.  Scheduled visit with Verdie Drown NP for next week

## 2022-05-15 NOTE — Progress Notes (Unsigned)
HPI: [name] is a 46 y.o. female, who is here today to follow on recent lab and to discuss a few concerns.  She had recent lab work done, including thyroid, cholesterol, and complete metabolic panel. She expresses concern about the upward trend in her thyroid levels,elevated HgA1C, and the increase in LDL cholesterol.   She has hx of of lupus, states that her rheumatologist had rheumatologist test done and she was told it was lupus even though results were not conclusive.  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 She has not seen rheumatologist in 2 years. Lab Results  Component Value Date   ESRSEDRATE 68 (H) 05/14/2022   Lab Results  Component Value Date   CRP <1.0 05/14/2022   She also discusses her family history of dementia, with her mother having vascular dementia and her maternal grandmothers having dementia of unknown type. She expresses concern about her own risk for dementia and inquiring about genetic testing. She also mentions her family history of fibromuscular dysplasia, aunt, states that she is having regular surveillance scans. Her cousin has the gene for fibromuscular dysplasia.  The patient also expresses anxiety about her family history of dementia and her own risk for developing the condition. Mild HLD, she is not on statin medication. Lab Results  Component Value Date   CHOL 195 05/14/2022   HDL 64.20 05/14/2022   LDLCALC 120 (H) 05/14/2022   TRIG 56.0 05/14/2022   CHOLHDL 3 05/14/2022   She reports feeling overwhelmed by the stressors in her life, which have affected her ability to maintain a healthy diet and exercise regularly. She has gained some wt.  HTN, she follows with cardiologist. States that her recent increase in spironolactone dosage due to elevated BP. She is also on HCTZ 25 mg daily and Metoprolol succinate 25 mg daily.  Negative for severe/frequent headache, visual changes, chest pain, dyspnea, claudication, focal weakness,  or edema. Occasional palpitations. Echo on 12/25/21 LVEF 65-70%. Cardiac monitor 09/2021: Rare PACs and PVCs.Ventricular triplet   Hx of mildly elevated platelets. Lab Results  Component Value Date   WBC 8.8 05/14/2022   HGB 12.0 05/14/2022   HCT 37.6 05/14/2022   MCV 85.9 05/14/2022   PLT 430.0 (H) 05/14/2022   Review of Systems  Constitutional:  Positive for fatigue. Negative for chills and fever.  Respiratory:  Negative for cough and wheezing.   Endocrine: Negative for cold intolerance and heat intolerance.  Neurological:  Negative for syncope and facial asymmetry.  Psychiatric/Behavioral:  Negative for confusion. The patient is nervous/anxious.   See other pertinent positives and negatives in HPI.  Current Outpatient Medications on File Prior to Visit  Medication Sig Dispense Refill   hydrochlorothiazide (HYDRODIURIL) 25 MG tablet Take 1 tablet (25 mg total) by mouth daily. 90 tablet 3   metoprolol succinate (TOPROL-XL) 25 MG 24 hr tablet TAKE 1 TABLET (25 MG TOTAL) BY MOUTH DAILY. 90 tablet 2   OVER THE COUNTER MEDICATION Turmeric 1500mg      OVER THE COUNTER MEDICATION Elderberry syrup with other herbs as needed     spironolactone (ALDACTONE) 50 MG tablet Take 1 tablet (50 mg total) by mouth daily. 90 tablet 3   No current facility-administered medications on file prior to visit.   Past Medical History:  Diagnosis Date   ADD (attention deficit disorder)    B12 deficiency    Chest pain    Chronic headaches    Depression    Fibromyalgia    Gallbladder disease  Hypertension    Insomnia    Joint pain    Lactose intolerance    MRSA (methicillin resistant Staphylococcus aureus) infection    OSA (obstructive sleep apnea) 03/24/2015   Psoriatic arthritis ([place])    Systemic lupus erythematosus ([place]) 10/01/2016   -seeing dermatology and rheumatologist (Dr. Dossie Der)   Vitamin D deficiency    Allergies  Allergen Reactions   Iodinated Contrast Media Anaphylaxis   Dexamethasone      Severe vaginal and rectal burning   Iohexol      Desc: HIVES    Sulfa Antibiotics Hives   Sulfasalazine Hives   Social History   Socioeconomic History   Marital status: Divorced    Spouse name: Not on file   Number of children: Not on file   Years of education: Not on file   Highest education level: Bachelor's degree (e.g., BA, AB, BS)  Occupational History   Occupation: Therapist, sports  Tobacco Use   Smoking status: Never   Smokeless tobacco: Never  Vaping Use   Vaping Use: Never used  Substance and Sexual Activity   Alcohol use: No    Alcohol/week: 0.0 standard drinks of alcohol   Drug use: No   Sexual activity: Not on file  Other Topics Concern   Not on file  Social History Narrative   Work or School: Therapist, sports - Engineer, manufacturing systems, Airline pilot      Home Situation: lives with her son and daughter      Spiritual Beliefs: Christian      Lifestyle: Curves, some exercise and trying to work on diet      Social Determinants of Health   Financial Resource Strain: [place]  (05/13/2022)   Overall Financial Resource Strain (CARDIA)    Difficulty of Paying Living Expenses: Not very hard  Food Insecurity: No Food Insecurity (05/13/2022)   Hunger Vital Sign    Worried About Running Out of Food in the Last Year: Never true    [place] in the Last Year: Never true  Transportation Needs: No Transportation Needs (05/13/2022)   PRAPARE - Hydrologist (Medical): No    Lack of Transportation (Non-Medical): No  Physical Activity: Unknown (05/13/2022)   Exercise Vital Sign    Days of Exercise per Week: 0 days    Minutes of Exercise per Session: Not on file  Stress: Stress Concern Present (05/13/2022)   [place]    Feeling of Stress : Very much  Social Connections: Unknown (05/13/2022)   Social Connection and Isolation Panel [NHANES]    Frequency of Communication with Friends and Family:  More than three times a week    Frequency of Social Gatherings with Friends and Family: More than three times a week    Attends Religious Services: Patient refused    Active Member of Clubs or Organizations: No    Attends Archivist Meetings: Not on file    Marital Status: Divorced   Vitals:   05/17/22 1521  BP: 126/70  Pulse: 92  Resp: 12  Temp: 99.1 F (37.3 C)  SpO2: 98%   Body mass index is 38.38 kg/m.  Physical Exam Vitals and nursing note reviewed.  Constitutional:      General: She is not in acute distress.    Appearance: She is well-developed.  HENT:     Head: Normocephalic and atraumatic.     Mouth/Throat:     Mouth: Mucous membranes are moist.  Pharynx: Oropharynx is clear.  Eyes:     Conjunctiva/sclera: Conjunctivae normal.  Neck:     Thyroid: Thyromegaly present.  Cardiovascular:     Rate and Rhythm: Normal rate and regular rhythm.     Pulses:          Dorsalis pedis pulses are 2+ on the right side and 2+ on the left side.     Heart sounds: No murmur heard. Pulmonary:     Effort: Pulmonary effort is normal. No respiratory distress.     Breath sounds: Normal breath sounds.  Abdominal:     Palpations: Abdomen is soft. There is no hepatomegaly or mass.     Tenderness: There is no abdominal tenderness.  Lymphadenopathy:     Cervical: No cervical adenopathy.  Skin:    General: Skin is warm.     Findings: No erythema or rash.  Neurological:     General: No focal deficit present.     Mental Status: She is alert and oriented to person, place, and time.     Cranial Nerves: No cranial nerve deficit.     Gait: Gait normal.  Psychiatric:        Mood and Affect: Affect normal. Mood is anxious.   ASSESSMENT AND PLAN:  [name] was seen today for results.  Diagnoses and all orders for this visit: Orders Placed This Encounter  Procedures   US THYROID   Ambulatory referral to Genetics   Systemic lupus erythematosus, unspecified SLE type,  unspecified organ involvement status ([place]) Dx'ed with discoid lupus years ago, she is not having skin lesions at this time. She is not longer following with rheumatologist.  Enlarged thyroid gland Mildly enlarged, ? Thyroid nodule. Last TSH 3.2. Thyroid US will be arranged.  Essential hypertension BP adequately controled. Continue on HCTZ 25 mg daily ,Spironolactone 50 mg daily.and Metoprolol succinate 25 mg daily.  Family history of carrier of genetic disease Concerned about Fhx of dementia and fibromuscular dysplasia among some. Interested in genetic testing, long discussion about pros and cons. She has not had colonoscopy done, referral placed in 08/2021, she is to call back to re-schedule. She follows with gyn for her female preventive care. We discussed the importance of a healthy life style for disease prevention.  Hyperlipidemia, unspecified hyperlipidemia type Improved. We discussed general recommendations in regard to statin use as well as CV benefits. She is concerned about CVD Fhx. We also reviewed recommendations for aspirin in primary prevention as well as side effects. She is not interested in statin , meeting with her cardiologist next week. Aspirin 81 mg daily.  Class 2 severe obesity with serious comorbidity and body mass index (BMI) of 38.0 to 38.9 in adult, unspecified obesity type [facility]) Patient understands the benefits of wt loss as well as adverse effects of obesity. Consistency with healthy diet and physical activity encouraged.  Elevated sed rate  Otherwise stable when compared with lab done 3 years ago. CRP is normal. Platelets mildly elevated, also stable when compared with previous labs.  I spent a total of 46 minutes in both face to face and non face to face activities for this visit on the date of this encounter. During this time history was obtained and documented, examination was performed, prior labs/imaging reviewed, and assessment/plan  discussed.  Return if symptoms worsen or fail to improve, for keep next appointment.  Sandra Vang G. [place], MD  [facility]. [place] office.

## 2022-05-15 NOTE — Telephone Encounter (Signed)
Spoke with patient, see phone note 11/15

## 2022-05-17 ENCOUNTER — Ambulatory Visit (INDEPENDENT_AMBULATORY_CARE_PROVIDER_SITE_OTHER): Payer: No Typology Code available for payment source | Admitting: Family Medicine

## 2022-05-17 ENCOUNTER — Encounter: Payer: Self-pay | Admitting: Family Medicine

## 2022-05-17 VITALS — BP 126/70 | HR 92 | Temp 99.1°F | Resp 12 | Ht 61.0 in | Wt 203.1 lb

## 2022-05-17 DIAGNOSIS — R700 Elevated erythrocyte sedimentation rate: Secondary | ICD-10-CM

## 2022-05-17 DIAGNOSIS — E049 Nontoxic goiter, unspecified: Secondary | ICD-10-CM

## 2022-05-17 DIAGNOSIS — Z8481 Family history of carrier of genetic disease: Secondary | ICD-10-CM

## 2022-05-17 DIAGNOSIS — M329 Systemic lupus erythematosus, unspecified: Secondary | ICD-10-CM

## 2022-05-17 DIAGNOSIS — E785 Hyperlipidemia, unspecified: Secondary | ICD-10-CM

## 2022-05-17 DIAGNOSIS — I10 Essential (primary) hypertension: Secondary | ICD-10-CM | POA: Diagnosis not present

## 2022-05-17 DIAGNOSIS — Z6838 Body mass index (BMI) 38.0-38.9, adult: Secondary | ICD-10-CM

## 2022-05-19 NOTE — Progress Notes (Unsigned)
Office Visit    Patient Name: Sandra Vang Date of Encounter: 05/19/2022  Primary Care Provider:  [place], Betty G, MD Primary Cardiologist:  Chilton Si, MD Primary Electrophysiologist: None  Chief Complaint    Katara Griner is a 46 y.o. female with PMH of HTN, systemic lupus erythematosus, fibromyalgia, OSA, psoriatic arthritis, ADD, depression who presents today for complaint of dizziness and chest pain.  Past Medical History    Past Medical History:  Diagnosis Date   ADD (attention deficit disorder)    B12 deficiency    Chest pain    Chronic headaches    Depression    Fibromyalgia    Gallbladder disease    Hypertension    Insomnia    Joint pain    Lactose intolerance    MRSA (methicillin resistant Staphylococcus aureus) infection    OSA (obstructive sleep apnea) 03/24/2015   Psoriatic arthritis ([facility])    Systemic lupus erythematosus ([facility]) 10/01/2016   -seeing dermatology and rheumatologist (Dr. Kathi Ludwig)   Vitamin D deficiency    Past Surgical History:  Procedure Laterality Date   APPENDECTOMY  2005   CESAREAN SECTION     TUBAL LIGATION  1998    Allergies  Allergies  Allergen Reactions   Iodinated Contrast Media Anaphylaxis   Dexamethasone     Severe vaginal and rectal burning   Iohexol      Desc: HIVES    Sulfa Antibiotics Hives   Sulfasalazine Hives    History of Present Illness    Sandra Vang  is a 46 year old female with the above mention past medical history who presents today for complaint of chest pain and dizziness.  She was initially seen by Dr. Eden Emms on 08/2012 for complaint of chest pressure.  Stress echo was completed that was normal.  She was seen on 12/2020 by Dr. Duke Salvia for management of hypertension.  She was referred by Dr. Cherly Hensen on 09/2020 due to elevated blood pressure of 164/100.  She was initially diagnosed with HTN in 2016 during visit patient reported headaches that occurred in the morning and reported history of  OSA with noncompliance of CPAP.  Blood pressure during visit was 122/68.  She was treated previously on HCTZ and developed hypokalemia.  No changes were made to her therapy at that time and she continued amlodipine and was instructed to make lifestyle modifications and increase physical activity.  She was also advised to abstain from excess sodium in her diet.  She was referred for sleep study as well.  She was seen 08/2021 for follow-up and endorsed not doing well with palpitations that she notices primarily at night.  She also described a pounding in her ears and flushing sensation.  She also noted a episode of lightheadedness that progressed to tingling sensation in her mouth and metallic taste with presyncope.  Blood pressure during that time was 149/103. She was started on spironolactone 12.5 mg and potassium was discontinued from her medical list.  She noted a history of fibromuscular dysplasia.  She wore an event monitor that revealed PACs and PVCs but no sustained arrhythmias.  She was seen in follow-up 10/2021 and blood pressure remained uncontrolled.  She continued to struggle with sodium and reported continued palpitations.  She was started on metoprolol 25 mg to help with PVCs and spironolactone was increased to 25 mg.  She was seen by Gillian Shields, NP on 12/11/2021 for video visit.  She reported blood pressure still not at goal and declined transition of  metoprolol to carvedilol.  She has not completed BMP and therefore spironolactone/HCTZ was not titrated.  2D echo was ordered due to complaint of increased lower extremity edema.  2D echo results revealed no significant valvular abnormalities and EF of 65-70% with mild LVH.  She was last seen 05/03/2022 for follow-up with blood pressure still not at goal and BMP not completed for titration purposes of blood pressure medications.  She was referred to [facility] per her request due to possible venous insufficiency.  Ms. Mersereau presents today for complaint of  dizziness and chest discomfort along.  Since last being seen in the office patient reports that she is experienced occasional bouts of dizziness that are usually associated with increased stress.  She also endorses chest pain that she described as a "tired feeling" in her chest.  She states that this discomfort is relieved with rest and decreasing her stressful issue.  Today during her visit she had no chest pain complaints or concerns.  Her blood pressures today were well controlled at 134/78.  She does state that at home she has been getting pressures in the 140s to 150s systolically that improved with her spironolactone.  She has a significant family history of hypertension and coronary artery disease and recently discovered that fibromyalgia muscular dysplasia was also present in her family.  Patient denies  palpitations, dyspnea, PND, orthopnea, nausea, vomiting, dizziness, syncope, edema, weight gain, or early satiety.   Home Medications    Current Outpatient Medications  Medication Sig Dispense Refill   hydrochlorothiazide (HYDRODIURIL) 25 MG tablet Take 1 tablet (25 mg total) by mouth daily. 90 tablet 3   metoprolol succinate (TOPROL-XL) 25 MG 24 hr tablet TAKE 1 TABLET (25 MG TOTAL) BY MOUTH DAILY. 90 tablet 2   OVER THE COUNTER MEDICATION Turmeric 1500mg      OVER THE COUNTER MEDICATION Elderberry syrup with other herbs as needed     spironolactone (ALDACTONE) 50 MG tablet Take 1 tablet (50 mg total) by mouth daily. 90 tablet 3   No current facility-administered medications for this visit.     Review of Systems  Please see the history of present illness.    (+) Chest pain (+) Dizziness  All other systems reviewed and are otherwise negative except as noted above.  Physical Exam    Wt Readings from Last 3 Encounters:  05/17/22 203 lb 2 oz (92.1 kg)  05/03/22 197 lb (89.4 kg)  02/01/22 190 lb (86.2 kg)   04/03/22 were no vitals filed for this visit.,There is no height or weight on  file to calculate BMI.  Constitutional:      Appearance: Healthy appearance. Not in distress.  Neck:     Vascular: JVD normal.  Pulmonary:     Effort: Pulmonary effort is normal.     Breath sounds: No wheezing. No rales. Diminished in the bases Cardiovascular:     Normal rate. Regular rhythm. Normal S1. Normal S2.      Murmurs: There is no murmur.  Edema:    Peripheral edema absent.  Abdominal:     Palpations: Abdomen is soft non tender. There is no hepatomegaly.  Skin:    General: Skin is warm and dry.  Neurological:     General: No focal deficit present.     Mental Status: Alert and oriented to person, place and time.     Cranial Nerves: Cranial nerves are intact.  EKG/LABS/Other Studies Reviewed    ECG personally reviewed by me today -sinus rhythm with rate of  71 bpm with possible LVH present and TWI in lead III with no other acute changes noted.    Lab Results  Component Value Date   WBC 8.8 05/14/2022   HGB 12.0 05/14/2022   HCT 37.6 05/14/2022   MCV 85.9 05/14/2022   PLT 430.0 (H) 05/14/2022   Lab Results  Component Value Date   CREATININE 0.84 05/14/2022   BUN 17 05/14/2022   NA 135 05/14/2022   K 3.9 05/14/2022   CL 101 05/14/2022   CO2 28 05/14/2022   Lab Results  Component Value Date   ALT 11 05/14/2022   AST 18 05/14/2022   ALKPHOS 80 05/14/2022   BILITOT 0.3 05/14/2022   Lab Results  Component Value Date   CHOL 195 05/14/2022   HDL 64.20 05/14/2022   LDLCALC 120 (H) 05/14/2022   TRIG 56.0 05/14/2022   CHOLHDL 3 05/14/2022    Lab Results  Component Value Date   HGBA1C 6.0 05/14/2022    Assessment & Plan    1.  Essential hypertension: -Patient's blood pressure today was controlled at 134/78 -She reports blood pressures at home systolically in the 140s and 150s -We will discontinue Toprol-XL and start carvedilol 3.125 mg twice daily -Continue spironolactone 50 mg and HCTZ 25 mg daily -She was instructed to check blood pressures twice a  day over the next few weeks.  2.  Lower extremity edema: -Today she reports improvement with swelling. -She was euvolemic on examination and was encouraged to purchase compression stockings and elevate lower extremities when dependent.  3.  History of OSA: -Patient currently using CPAP  4.  Chest pain: -Today patient reports no current chest pain but does endorse chest discomfort that is associated with increased stress related to her job.  She also notes discomfort with physical activity and exertion. -We will send for ETT for further evaluation.  5.  Dizziness: -Patient reports dizziness associated with her chest discomfort and also with positional changes. -She was advised to change positions slowly decrease any rapid motion with her head. -She was also encouraged to hydrate increase physical activity as tolerated.      Disposition: Follow-up with Chilton Si, MD or APP in 2 months Shared Decision Making/Informed Consent The risks [chest pain, shortness of breath, cardiac arrhythmias, dizziness, blood pressure fluctuations, myocardial infarction, stroke/transient ischemic attack, and life-threatening complications (estimated to be 1 in 10,000)], benefits (risk stratification, diagnosing coronary artery disease, treatment guidance) and alternatives of an exercise tolerance test were discussed in detail with Ms. Paradiso and she agrees to proceed.   Medication Adjustments/Labs and Tests Ordered: Current medicines are reviewed at length with the patient today.  Concerns regarding medicines are outlined above.   Signed, Napoleon Form, Leodis Rains, NP 05/19/2022, 3:46 PM [facility]  Note:  This document was prepared using Dragon voice recognition software and may include unintentional dictation errors.

## 2022-05-21 ENCOUNTER — Encounter: Payer: Self-pay | Admitting: Nurse Practitioner

## 2022-05-21 ENCOUNTER — Ambulatory Visit: Payer: No Typology Code available for payment source | Attending: Nurse Practitioner | Admitting: Nurse Practitioner

## 2022-05-21 VITALS — BP 134/78 | HR 71 | Ht 61.0 in | Wt 199.0 lb

## 2022-05-21 DIAGNOSIS — R42 Dizziness and giddiness: Secondary | ICD-10-CM | POA: Diagnosis not present

## 2022-05-21 DIAGNOSIS — G4733 Obstructive sleep apnea (adult) (pediatric): Secondary | ICD-10-CM | POA: Diagnosis not present

## 2022-05-21 DIAGNOSIS — R600 Localized edema: Secondary | ICD-10-CM

## 2022-05-21 DIAGNOSIS — I10 Essential (primary) hypertension: Secondary | ICD-10-CM

## 2022-05-21 DIAGNOSIS — R079 Chest pain, unspecified: Secondary | ICD-10-CM | POA: Diagnosis not present

## 2022-05-21 MED ORDER — CARVEDILOL 3.125 MG PO TABS: 3.1250 mg | ORAL_TABLET | Freq: Two times a day (BID) | ORAL | 3 refills | Status: DC

## 2022-05-21 NOTE — Patient Instructions (Signed)
Medication Instructions:  STOP Toprol  START Coreg 3.125mg  Take 1 tablet twice a day *If you need a refill on your cardiac medications before your next appointment, please call your pharmacy*   Lab Work: None Ordered   Testing/Procedures: Exercise Tolerance Test   Follow-Up: At [facility], you and your health needs are our priority.  As part of our continuing mission to provide you with exceptional heart care, we have created designated Provider Care Teams.  These Care Teams include your primary Cardiologist (physician) and Advanced Practice Providers (APPs -  Physician Assistants and Nurse Practitioners) who all work together to provide you with the care you need, when you need it.  We recommend signing up for the patient portal called "MyChart".  Sign up information is provided on this After Visit Summary.  MyChart is used to connect with patients for Virtual Visits (Telemedicine).  Patients are able to view lab/test results, encounter notes, upcoming appointments, etc.  Non-urgent messages can be sent to your provider as well.   To learn more about what you can do with MyChart, go to [email or web address].    Your next appointment:   2 month(s)  The format for your next appointment:   In Person  Provider:   Robin Searing, NP       Other Instructions CHECK YOUR BLOOD PRESSURE DAILY FOR 1-2 WEEKS THEN CONTACT THE OFFICE WITH THE READINGS  Important Information About Sugar

## 2022-05-28 ENCOUNTER — Other Ambulatory Visit: Payer: Self-pay

## 2022-05-28 NOTE — Addendum Note (Signed)
Addended by: Elizabeth Palau on: 05/28/2022 02:21 PM   Modules accepted: Orders

## 2022-05-31 ENCOUNTER — Ambulatory Visit
Admission: RE | Admit: 2022-05-31 | Discharge: 2022-05-31 | Disposition: A | Discharge status: Home / Self Care | Payer: No Typology Code available for payment source | Source: Ambulatory Visit | Attending: Family Medicine | Admitting: Family Medicine

## 2022-05-31 DIAGNOSIS — E049 Nontoxic goiter, unspecified: Secondary | ICD-10-CM

## 2022-06-04 ENCOUNTER — Ambulatory Visit: Payer: No Typology Code available for payment source | Attending: Nurse Practitioner

## 2022-06-04 DIAGNOSIS — R079 Chest pain, unspecified: Secondary | ICD-10-CM | POA: Diagnosis not present

## 2022-06-04 DIAGNOSIS — R42 Dizziness and giddiness: Secondary | ICD-10-CM | POA: Diagnosis not present

## 2022-06-04 DIAGNOSIS — I10 Essential (primary) hypertension: Secondary | ICD-10-CM

## 2022-06-06 LAB — EXERCISE TOLERANCE TEST
Angina Index: 0
Duke Treadmill Score: 5
Estimated workload: 7
Exercise duration (min): 5 min
Exercise duration (sec): 3 s
MPHR: 174 {beats}/min
Peak HR: 151 {beats}/min
Percent HR: 86 %
RPE: 16
Rest HR: 80 {beats}/min
ST Depression (mm): 0 mm

## 2022-06-07 ENCOUNTER — Encounter: Payer: Self-pay | Admitting: Family Medicine

## 2022-06-07 ENCOUNTER — Telehealth (INDEPENDENT_AMBULATORY_CARE_PROVIDER_SITE_OTHER): Payer: No Typology Code available for payment source | Admitting: Family Medicine

## 2022-06-07 VITALS — Ht 61.0 in

## 2022-06-07 DIAGNOSIS — E042 Nontoxic multinodular goiter: Secondary | ICD-10-CM | POA: Diagnosis not present

## 2022-06-07 DIAGNOSIS — M25512 Pain in left shoulder: Secondary | ICD-10-CM

## 2022-06-07 DIAGNOSIS — R490 Dysphonia: Secondary | ICD-10-CM | POA: Diagnosis not present

## 2022-06-07 DIAGNOSIS — G8929 Other chronic pain: Secondary | ICD-10-CM

## 2022-06-07 DIAGNOSIS — M7532 Calcific tendinitis of left shoulder: Secondary | ICD-10-CM | POA: Diagnosis not present

## 2022-06-07 MED ORDER — OMEPRAZOLE 40 MG PO CPDR: 40.0000 mg | DELAYED_RELEASE_CAPSULE | Freq: Every day | ORAL | 0 refills | Status: DC

## 2022-06-07 MED ORDER — CELECOXIB 100 MG PO CAPS: 100.0000 mg | ORAL_CAPSULE | Freq: Two times a day (BID) | ORAL | 0 refills | Status: AC

## 2022-06-07 MED ORDER — METAXALONE 800 MG PO TABS: 800.0000 mg | ORAL_TABLET | Freq: Every evening | ORAL | 0 refills | Status: DC | PRN

## 2022-06-07 NOTE — Progress Notes (Unsigned)
Virtual Visit via Video Note I connected with Delray Alt on 06/08/22 by a video enabled telemedicine application and verified that I am speaking with the correct person using two identifiers. Location patient: home Location provider:work office Persons participating in the virtual visit: patient, provider  I discussed the limitations of evaluation and management by telemedicine and the availability of in person appointments. The patient expressed understanding and agreed to proceed.  Chief Complaint  Patient presents with   Shoulder Pain    Left shoulder   HPI: [name] is a 46 yo female with hx of HTN,OSA,lupus, polyarthralgia,vit D def,and fibromyalgia c/o left shoulder pain, which started a couple of weeks ago and worsened in the last week.  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 She limitations with lifting LUE and putting on clothes, pain is affecting sleep. She rates the pain as 10+ on a scale of 1 to 10, describing it as throbbing, aching, and constant.  Exacerbated by movement and alleviated by rest. She has previously received injections for the condition, with the last one administered over a year ago. Left shoulder X ray in 01/2020.: 1. 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 2. No acute bony abnormality.  She has been taking valerian as well as turmeric, Motrin, and a muscle relaxant. She reports that two expired Metaxalone 800 mg tablets helped her with pain and therefore with sleep. Surgical treatment has been recommended but she cannot take time from work for recovering, she is considering surgery in 2024.  Thyroid US was recently done because enlarged thyroid noted on examination. Lab Results  Component Value Date   TSH 3.22 05/14/2022   She experiences a sensation of a "knot" in her throat and intermittent  hoarseness, which has been occurring for a few months.She wonders if these symptoms are caused by thyroid nodule.  She denies difficulty swallowing, heartburn, and pruritus of nose, throat, or eyes.  Thyroid US 05/31/22:Multiple thyroid nodules, as described above, largest a LEFT inferior TR-3 thyroid nodule. None of these nodules NOT meet TI-RADS criteria for biopsy or dedicated follow-up.  ROS: See pertinent positives and negatives per HPI.  Past Medical History:  Diagnosis Date   ADD (attention deficit disorder)    B12 deficiency    Chest pain    Chronic headaches    Depression    Fibromyalgia    Gallbladder disease    Hypertension    Insomnia    Joint pain    Lactose intolerance    MRSA (methicillin resistant Staphylococcus aureus) infection    OSA (obstructive sleep apnea) 03/24/2015   Psoriatic arthritis (HCC)    Systemic lupus erythematosus (HCC) 10/01/2016   -seeing dermatology and rheumatologist (Dr. Kathi Ludwig)   Vitamin D deficiency     Past Surgical History:  Procedure Laterality Date   APPENDECTOMY  2005   CESAREAN SECTION     TUBAL LIGATION  1998   Family History  Problem Relation Age of Onset   Alcohol abuse Mother    Hypertension Mother    High Cholesterol Mother    Kidney disease Mother    Anxiety disorder Mother    Drug abuse Father    Hypertension Father    Emphysema Father    High Cholesterol Father    Anxiety disorder Father    Asthma Brother    Multiple sclerosis Brother    Depression Paternal Aunt  Depression Paternal Uncle    Heart failure Maternal Grandmother    Alcohol abuse Maternal Grandmother    Arthritis Maternal Grandmother    Cancer Maternal Grandmother    Hypertension Maternal Grandmother    Alcohol abuse Maternal Grandfather    Hypertension Maternal Grandfather    Heart failure Paternal Grandmother    Arthritis Paternal Grandmother    Cancer Paternal Grandmother    Hypertension Paternal Grandmother    Asthma Paternal Grandmother     Hypertension Paternal Grandfather    Asthma Daughter    Asthma Daughter     Social History   Socioeconomic History   Marital status: Divorced    Spouse name: Not on file   Number of children: Not on file   Years of education: Not on file   Highest education level: Bachelor's degree (e.g., BA, AB, BS)  Occupational History   Occupation: Therapist, sports  Tobacco Use   Smoking status: Never   Smokeless tobacco: Never  Vaping Use   Vaping Use: Never used  Substance and Sexual Activity   Alcohol use: No    Alcohol/week: 0.0 standard drinks of alcohol   Drug use: No   Sexual activity: Not on file  Other Topics Concern   Not on file  Social History Narrative   Work or School: Therapist, sports - Engineer, manufacturing systems, Airline pilot      Home Situation: lives with her son and daughter      Spiritual Beliefs: Christian      Lifestyle: Curves, some exercise and trying to work on diet      Social Determinants of Health   Financial Resource Strain: [place]  (05/13/2022)   Overall Financial Resource Strain (CARDIA)    Difficulty of Paying Living Expenses: Not very hard  Food Insecurity: No Food Insecurity (05/13/2022)   Hunger Vital Sign    Worried About Running Out of Food in the Last Year: Never true    [place] in the Last Year: Never true  Transportation Needs: No Transportation Needs (05/13/2022)   PRAPARE - Hydrologist (Medical): No    Lack of Transportation (Non-Medical): No  Physical Activity: Unknown (05/13/2022)   Exercise Vital Sign    Days of Exercise per Week: 0 days    Minutes of Exercise per Session: Not on file  Stress: Stress Concern Present (05/13/2022)   [place]    Feeling of Stress : Very much  Social Connections: Unknown (05/13/2022)   Social Connection and Isolation Panel [NHANES]    Frequency of Communication with Friends and Family: More than three times a week    Frequency  of Social Gatherings with Friends and Family: More than three times a week    Attends Religious Services: Patient refused    Active Member of Clubs or Organizations: No    Attends Music therapist: Not on file    Marital Status: Divorced  Intimate Partner Violence: Not At Risk (01/08/2021)   Humiliation, Afraid, Rape, and Kick questionnaire    Fear of Current or Ex-Partner: No    Emotionally Abused: No    Physically Abused: No    Sexually Abused: No    Current Outpatient Medications:    carvedilol (COREG) 3.125 MG tablet, Take 1 tablet (3.125 mg total) by mouth 2 (two) times daily., Disp: 60 tablet, Rfl: 3   celecoxib (CELEBREX) 100 MG capsule, Take 1 capsule (100 mg total) by mouth 2 (two) times  daily for 10 days., Disp: 20 capsule, Rfl: 0   hydrochlorothiazide (HYDRODIURIL) 25 MG tablet, Take 1 tablet (25 mg total) by mouth daily., Disp: 90 tablet, Rfl: 3   metaxalone (SKELAXIN) 800 MG tablet, Take 1 tablet (800 mg total) by mouth at bedtime as needed for muscle spasms., Disp: 30 tablet, Rfl: 0   omeprazole (PRILOSEC) 40 MG capsule, Take 1 capsule (40 mg total) by mouth daily., Disp: 60 capsule, Rfl: 0   OVER THE COUNTER MEDICATION, Turmeric 1500mg , Disp: , Rfl:    OVER THE COUNTER MEDICATION, Elderberry syrup with other herbs as needed, Disp: , Rfl:    spironolactone (ALDACTONE) 50 MG tablet, Take 1 tablet (50 mg total) by mouth daily., Disp: 90 tablet, Rfl: 3  EXAM:  VITALS per patient if applicable:Ht 5\' 1"  (1.549 m)   BMI 37.60 kg/m   GENERAL: alert, oriented, appears well and in no acute distress  HEENT: atraumatic, conjunctiva clear, no obvious abnormalities on inspection.  NECK: normal movements of the head and neck  LUNGS: on inspection no signs of respiratory distress, breathing rate appears normal, no obvious gross SOB, gasping or wheezing  CV: no obvious cyanosis  MS: Limitation of ROM left shoulder, movement elicits pain.I do not appreciate edema or  erythema.  PSYCH/NEURO: pleasant and cooperative, no obvious depression or anxiety, speech and thought processing grossly intact  ASSESSMENT AND PLAN:  Discussed the following assessment and plan:  Chronic left shoulder pain - Plan: metaxalone (SKELAXIN) 800 MG tablet, celecoxib (CELEBREX) 100 MG capsule  Calcific tendinitis of left shoulder  Dysphonia - Plan: omeprazole (PRILOSEC) 40 MG capsule  Multiple thyroid nodules The patient is allergic to sulfasalazine and has not taken Celebrex before but is open to trying it for acute pain. She understands that she cannot take Motrin with Celebrex. She is also interested in receiving another injection to manage the pain until she can have surgery. Chronic left shoulder pain Metaxalone is helping, so continue taking it at bedtime prn. Stop Motrin and try Celebrex 100 mg bid prn. NSAID's side effects discussed. I do not think imaging is needed at this time.  -     Metaxalone; Take 1 tablet (800 mg total) by mouth at bedtime as needed for muscle spasms.  Dispense: 30 tablet; Refill: 0 -     Celecoxib; Take 1 capsule (100 mg total) by mouth 2 (two) times daily for 10 days.  Dispense: 20 capsule; Refill: 0  Calcific tendinitis of left shoulder Assessment & Plan: This problem has been treatment by ortho. She is planning on arranging f/u appt early next year. For now she will continue pain management with skelaxin and Celebrex. ROM exercises recommended.   Dysphonia Assessment & Plan: We discussed possible etiologies. I do not think this is caused by thyroid nodules, she is very concerned about this possibility. Options: referral to general surgeon or ENT. She prefers to hold on the former one, she has seen ENT, so will arrange a f/u appt. She agrees with PPI trial, Omeprazole 40 mg 30 min before breakfast for 6-8 weeks.  Orders: -     Omeprazole; Take 1 capsule (40 mg total) by mouth daily.  Dispense: 60 capsule; Refill: 0  Multiple  thyroid nodules Assessment & Plan: We discussed findings on thyroid , no further follow up with imaging or Bx are needed at this time. We can continue monitoring TSH annually.   We discussed possible serious and likely etiologies, options for evaluation and workup, limitations of telemedicine  visit vs in person visit, treatment, treatment risks and precautions. The patient was advised to call back or seek an in-person evaluation if the symptoms worsen or if the condition fails to improve as anticipated. I discussed the assessment and treatment plan with the patient. The patient was provided an opportunity to ask questions and all were answered. The patient agreed with the plan and demonstrated an understanding of the instructions.  Return if symptoms worsen or fail to improve, for keep next appointment. Breland Elders G. [place], MD  [facility]. [place] office.

## 2022-06-08 ENCOUNTER — Encounter: Payer: Self-pay | Admitting: Family Medicine

## 2022-06-08 DIAGNOSIS — R490 Dysphonia: Secondary | ICD-10-CM | POA: Insufficient documentation

## 2022-06-08 DIAGNOSIS — E042 Nontoxic multinodular goiter: Secondary | ICD-10-CM | POA: Insufficient documentation

## 2022-06-08 DIAGNOSIS — M7532 Calcific tendinitis of left shoulder: Secondary | ICD-10-CM | POA: Insufficient documentation

## 2022-06-08 NOTE — Assessment & Plan Note (Signed)
This problem has been treatment by ortho. She is planning on arranging f/u appt early next year. For now she will continue pain management with skelaxin and Celebrex. ROM exercises recommended.

## 2022-06-08 NOTE — Assessment & Plan Note (Signed)
We discussed possible etiologies. I do not think this is caused by thyroid nodules, she is very concerned about this possibility. Options: referral to general surgeon or ENT. She prefers to hold on the former one, she has seen ENT, so will arrange a f/u appt. She agrees with PPI trial, Omeprazole 40 mg 30 min before breakfast for 6-8 weeks.

## 2022-06-08 NOTE — Assessment & Plan Note (Signed)
We discussed findings on thyroid US, no further follow up with imaging or Bx are needed at this time. We can continue monitoring TSH annually.

## 2022-06-14 ENCOUNTER — Other Ambulatory Visit: Payer: Self-pay | Admitting: *Deleted

## 2022-06-14 DIAGNOSIS — M7989 Other specified soft tissue disorders: Secondary | ICD-10-CM

## 2022-06-26 ENCOUNTER — Ambulatory Visit (INDEPENDENT_AMBULATORY_CARE_PROVIDER_SITE_OTHER): Payer: No Typology Code available for payment source | Admitting: Physician Assistant

## 2022-06-26 ENCOUNTER — Other Ambulatory Visit: Payer: Self-pay | Admitting: Nurse Practitioner

## 2022-06-26 ENCOUNTER — Ambulatory Visit (HOSPITAL_COMMUNITY)
Admission: RE | Admit: 2022-06-26 | Discharge: 2022-06-26 | Disposition: A | Discharge status: Home / Self Care | Payer: No Typology Code available for payment source | Source: Ambulatory Visit | Attending: Vascular Surgery | Admitting: Vascular Surgery

## 2022-06-26 VITALS — BP 140/87 | HR 70 | Temp 97.6°F | Ht 61.0 in | Wt 198.0 lb

## 2022-06-26 DIAGNOSIS — M7989 Other specified soft tissue disorders: Secondary | ICD-10-CM

## 2022-06-26 DIAGNOSIS — I872 Venous insufficiency (chronic) (peripheral): Secondary | ICD-10-CM | POA: Diagnosis not present

## 2022-06-26 NOTE — Progress Notes (Signed)
Requested by:  Alver Sorrow, NP [address] [place],  [place] [address]  Reason for consultation: swelling BLE    History of Present Illness   Sandra Vang is a 46 y.o. ([birth year]) female who presents for evaluation of swelling in BLE, L> R. She says this has been going on for many years. She also reports heaviness/ tightness in her legs on prolonged ambulation> 45 minutes. This also has been present for many years. 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 She does elevate her legs at night and this does help with swelling. She has never worn compression stockings before. She says due to death in her family she has not exercised for past two years but understands importance of getting back to it. She does not have any family history of DVT or venous disease.   Venous symptoms include: heavy, swelling Onset/duration:  many years  Occupation:  Facilities manager Aggravating factors: sitting, standing Alleviating factors: elevation Compression:  no Helps:  n/a Pain medications:  no Previous vein procedures:  no History of DVT:  no  Past Medical History:  Diagnosis Date   ADD (attention deficit disorder)    B12 deficiency    Chest pain    Chronic headaches    Depression    Fibromyalgia    Gallbladder disease    Hypertension    Insomnia    Joint pain    Lactose intolerance    MRSA (methicillin resistant Staphylococcus aureus) infection    OSA (obstructive sleep apnea) 03/24/2015   Psoriatic arthritis (HCC)    Systemic lupus erythematosus (HCC) 10/01/2016   -seeing dermatology and rheumatologist (Dr. Kathi Ludwig)   Vitamin D deficiency     Past Surgical History:  Procedure Laterality Date   APPENDECTOMY  2005   CESAREAN SECTION     TUBAL LIGATION  1998    Social History   Socioeconomic History   Marital status: Divorced    Spouse name: Not on file    Number of children: Not on file   Years of education: Not on file   Highest education level: Bachelor's degree (e.g., BA, AB, BS)  Occupational History   Occupation: Charity fundraiser  Tobacco Use   Smoking status: Never   Smokeless tobacco: Never  Vaping Use   Vaping Use: Never used  Substance and Sexual Activity   Alcohol use: No    Alcohol/week: 0.0 standard drinks of alcohol   Drug use: No   Sexual activity: Not on file  Other Topics Concern   Not on file  Social History Narrative   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, Community education officer      Home Situation: lives with her son and daughter      Spiritual Beliefs: Christian      Lifestyle: Curves, some exercise and trying to work on diet      Social Determinants of Health   Financial Resource Strain: Low Risk  (05/13/2022)   Overall Financial Resource Strain (CARDIA)    Difficulty of Paying Living Expenses: Not very hard  Food Insecurity: No Food Insecurity (05/13/2022)   Hunger Vital Sign    Worried About Running Out of Food in the Last Year: Never true    Ran Out of Food in the Last Year: Never true  Transportation Needs: No Transportation Needs (05/13/2022)   PRAPARE - Transportation  Lack of Transportation (Medical): No    Lack of Transportation (Non-Medical): No  Physical Activity: Unknown (05/13/2022)   Exercise Vital Sign    Days of Exercise per Week: 0 days    Minutes of Exercise per Session: Not on file  Stress: Stress Concern Present (05/13/2022)   [employer] of Occupational Health - Occupational Stress Questionnaire    Feeling of Stress : Very much  Social Connections: Unknown (05/13/2022)   Social Connection and Isolation Panel [NHANES]    Frequency of Communication with Friends and Family: More than three times a week    Frequency of Social Gatherings with Friends and Family: More than three times a week    Attends Religious Services: Patient refused    Active Member of Clubs or Organizations: No    Attends  Engineer, structural: Not on file    Marital Status: Divorced  Intimate Partner Violence: Not At Risk (01/08/2021)   Humiliation, Afraid, Rape, and Kick questionnaire    Fear of Current or Ex-Partner: No    Emotionally Abused: No    Physically Abused: No    Sexually Abused: No    Family History  Problem Relation Age of Onset   Alcohol abuse Mother    Hypertension Mother    High Cholesterol Mother    Kidney disease Mother    Anxiety disorder Mother    Drug abuse Father    Hypertension Father    Emphysema Father    High Cholesterol Father    Anxiety disorder Father    Asthma Brother    Multiple sclerosis Brother    Depression Paternal Aunt    Depression Paternal Uncle    Heart failure Maternal Grandmother    Alcohol abuse Maternal Grandmother    Arthritis Maternal Grandmother    Cancer Maternal Grandmother    Hypertension Maternal Grandmother    Alcohol abuse Maternal Grandfather    Hypertension Maternal Grandfather    Heart failure Paternal Grandmother    Arthritis Paternal Grandmother    Cancer Paternal Grandmother    Hypertension Paternal Grandmother    Asthma Paternal Grandmother    Hypertension Paternal Grandfather    Asthma Daughter    Asthma Daughter     Current Outpatient Medications  Medication Sig Dispense Refill   carvedilol (COREG) 3.125 MG tablet Take 1 tablet (3.125 mg total) by mouth 2 (two) times daily. 60 tablet 3   hydrochlorothiazide (HYDRODIURIL) 25 MG tablet Take 1 tablet (25 mg total) by mouth daily. 90 tablet 3   OVER THE COUNTER MEDICATION Turmeric 1500mg      OVER THE COUNTER MEDICATION Elderberry syrup with other herbs as needed     spironolactone (ALDACTONE) 50 MG tablet Take 1 tablet (50 mg total) by mouth daily. 90 tablet 3   No current facility-administered medications for this visit.    Allergies  Allergen Reactions   Iodinated Contrast Media Anaphylaxis   Dexamethasone     Severe vaginal and rectal burning   Iohexol       Desc: HIVES    Sulfa Antibiotics Hives   Sulfasalazine Hives    REVIEW OF SYSTEMS (negative unless checked):   Cardiac:  []  Chest pain or chest pressure? []  Shortness of breath upon activity? []  Shortness of breath when lying flat? []  Irregular heart rhythm?  Vascular:  []  Pain in calf, thigh, or hip brought on by walking? []  Pain in feet at night that wakes you up from your sleep? []  Blood clot in your veins? [x]  Leg  swelling?  Pulmonary:  []  Oxygen at home? []  Productive cough? []  Wheezing?  Neurologic:  []  Sudden weakness in arms or legs? []  Sudden numbness in arms or legs? []  Sudden onset of difficult speaking or slurred speech? []  Temporary loss of vision in one eye? []  Problems with dizziness?  Gastrointestinal:  []  Blood in stool? []  Vomited blood?  Genitourinary:  []  Burning when urinating? []  Blood in urine?  Psychiatric:  []  Major depression  Hematologic:  []  Bleeding problems? []  Problems with blood clotting?  Dermatologic:  []  Rashes or ulcers?  Constitutional:  []  Fever or chills?  Ear/Nose/Throat:  []  Change in hearing? []  Nose bleeds? []  Sore throat?  Musculoskeletal:  []  Back pain? []  Joint pain? []  Muscle pain?   Physical Examination     Vitals:   06/26/22 1234  BP: (!) 140/87  Pulse: 70  Temp: 97.6 F (36.4 C)  TempSrc: Temporal  SpO2: 100%  Weight: 198 lb (89.8 kg)  Height: 5\' 1"  (1.549 m)   Body mass index is 37.41 kg/m.  General:  WDWN in NAD; vital signs documented above Gait: Normal HENT: WNL, normocephalic Pulmonary: normal non-labored breathing  Cardiac: regular HR Vascular Exam/Pulses: popliteal, DP and PT pulses palpable bilaterally Extremities: without varicose veins, without reticular veins, with edema, without stasis pigmentation, without lipodermatosclerosis, without ulcers Musculoskeletal: no muscle wasting or atrophy  Neurologic: A&O X 3;  No focal weakness or paresthesias are  detected Psychiatric:  The pt has Normal affect.  Non-invasive Vascular Imaging   BLE Venous Insufficiency Duplex (06/26/22):  LLE:  No DVT and SVT GSV reflux only in mid thigh GSV diameter 0.19-0.62 in proximal thigh, not visualized in distal to proximal calf No SSV reflux CFV deep venous reflux AASV without reflux   Medical Decision Making   Sandra Vang is a 46 y.o. female who presents with: LLE chronic venous insufficiency with swelling and heaviness. Her duplex today shows no DVT or SVT. GSV in mid thigh only where it leaves fascia and then it is no longer visualized. No SSV reflux. CFV reflux and AASV without reflux. Based on today's ultrasound she would not be candidate for venous ablation.  Based on the patient's history and examination, I recommend: daily elevation above level of heart 20-30 minutes, knee high compression stockings, exercise, weight management, refraining from prolonged sitting or standing. She was measured and fitted for 15-20 mmHg knee high compression at today's visit.  She will follow up as needed if she has new or worsening symptoms   , PA-C [facility] [place] [place] Office: [phone]  06/26/2022, 12:39 PM  Clinic MD: 

## 2022-07-20 DIAGNOSIS — R110 Nausea: Secondary | ICD-10-CM | POA: Insufficient documentation

## 2022-07-20 DIAGNOSIS — R7303 Prediabetes: Secondary | ICD-10-CM | POA: Insufficient documentation

## 2022-07-24 ENCOUNTER — Other Ambulatory Visit (HOSPITAL_BASED_OUTPATIENT_CLINIC_OR_DEPARTMENT_OTHER): Payer: Self-pay | Admitting: Cardiovascular Disease

## 2022-07-25 ENCOUNTER — Encounter: Payer: Self-pay | Admitting: Family Medicine

## 2022-07-25 NOTE — Progress Notes (Deleted)
Office Visit    Patient Name: Sandra Vang Date of Encounter: 07/25/2022  Primary Care Provider:  [place], Betty G, MD Primary Cardiologist:  Skeet Latch, MD Primary Electrophysiologist: None  Chief Complaint    Sandra Vang is a 47 y.o. female with PMH of ***  Past Medical History    Past Medical History:  Diagnosis Date   ADD (attention deficit disorder)    B12 deficiency    Chest pain    Chronic headaches    Depression    Fibromyalgia    Gallbladder disease    Hypertension    Insomnia    Joint pain    Lactose intolerance    MRSA (methicillin resistant Staphylococcus aureus) infection    OSA (obstructive sleep apnea) 03/24/2015   Psoriatic arthritis ([place])    Systemic lupus erythematosus ([place]) 10/01/2016   -seeing dermatology and rheumatologist (Dr. Dossie Der)   Vitamin D deficiency    Past Surgical History:  Procedure Laterality Date   APPENDECTOMY  2005   CESAREAN SECTION     TUBAL LIGATION  1998    Allergies  Allergies  Allergen Reactions   Iodinated Contrast Media Anaphylaxis   Dexamethasone     Severe vaginal and rectal burning   Iohexol      Desc: HIVES    Sulfa Antibiotics Hives   Sulfasalazine Hives    History of Present Illness    Sandra Vang  is a *** year old *** with the above mention past medical history who presents today for**      Since last being seen in the office patient reports***.  Patient denies chest pain, palpitations, dyspnea, PND, orthopnea, nausea, vomiting, dizziness, syncope, edema, weight gain, or early satiety.     ***Notes:  Home Medications    Current Outpatient Medications  Medication Sig Dispense Refill   carvedilol (COREG) 3.125 MG tablet TAKE 1 TABLET BY MOUTH 2 TIMES DAILY. 180 tablet 3   hydrochlorothiazide (HYDRODIURIL) 25 MG tablet Take 1 tablet (25 mg total) by mouth daily. 90 tablet 3   OVER THE COUNTER MEDICATION Turmeric '1500mg'$      OVER THE COUNTER MEDICATION Elderberry syrup with  other herbs as needed     spironolactone (ALDACTONE) 50 MG tablet Take 1 tablet (50 mg total) by mouth daily. 90 tablet 3   No current facility-administered medications for this visit.     Review of Systems  Please see the history of present illness.    (+)*** (+)***  All other systems reviewed and are otherwise negative except as noted above.  Physical Exam    Wt Readings from Last 3 Encounters:  06/26/22 198 lb (89.8 kg)  05/21/22 199 lb (90.3 kg)  05/17/22 203 lb 2 oz (92.1 kg)   [id number] were no vitals filed for this visit.,There is no height or weight on file to calculate BMI.  Constitutional:      Appearance: Healthy appearance. Not in distress.  Neck:     Vascular: JVD normal.  Pulmonary:     Effort: Pulmonary effort is normal.     Breath sounds: No wheezing. No rales. Diminished in the bases Cardiovascular:     Normal rate. Regular rhythm. Normal S1. Normal S2.      Murmurs: There is no murmur.  Edema:    Peripheral edema absent.  Abdominal:     Palpations: Abdomen is soft non tender. There is no hepatomegaly.  Skin:    General: Skin is warm and dry.  Neurological:  General: No focal deficit present.     Mental Status: Alert and oriented to person, place and time.     Cranial Nerves: Cranial nerves are intact.  EKG/LABS/Other Studies Reviewed    ECG personally reviewed by me today - ***  Risk Assessment/Calculations:   {Does this patient have ATRIAL FIBRILLATION?:[phone]}        Lab Results  Component Value Date   WBC 8.8 05/14/2022   HGB 12.0 05/14/2022   HCT 37.6 05/14/2022   MCV 85.9 05/14/2022   PLT 430.0 (H) 05/14/2022   Lab Results  Component Value Date   CREATININE 0.84 05/14/2022   BUN 17 05/14/2022   NA 135 05/14/2022   K 3.9 05/14/2022   CL 101 05/14/2022   CO2 28 05/14/2022   Lab Results  Component Value Date   ALT 11 05/14/2022   AST 18 05/14/2022   ALKPHOS 80 05/14/2022   BILITOT 0.3 05/14/2022   Lab Results   Component Value Date   CHOL 195 05/14/2022   HDL 64.20 05/14/2022   LDLCALC 120 (H) 05/14/2022   TRIG 56.0 05/14/2022   CHOLHDL 3 05/14/2022    Lab Results  Component Value Date   HGBA1C 6.0 05/14/2022    Assessment & Plan    1.***  2.***  3.***  4.***      Disposition: Follow-up with Skeet Latch, MD or APP in *** months {Are you ordering a CV Procedure (e.g. stress test, cath, DCCV, TEE, etc)?   Press F2        :[id number]   Medication Adjustments/Labs and Tests Ordered: Current medicines are reviewed at length with the patient today.  Concerns regarding medicines are outlined above.   Signed, Mable Fill, Marissa Nestle, NP 07/25/2022, 8:45 AM [facility]  Note:  This document was prepared using Dragon voice recognition software and may include unintentional dictation errors.

## 2022-07-26 ENCOUNTER — Ambulatory Visit: Payer: No Typology Code available for payment source | Admitting: Nurse Practitioner

## 2022-08-02 ENCOUNTER — Other Ambulatory Visit: Payer: Self-pay | Admitting: Family Medicine

## 2022-08-02 DIAGNOSIS — E042 Nontoxic multinodular goiter: Secondary | ICD-10-CM

## 2022-08-02 DIAGNOSIS — Z0189 Encounter for other specified special examinations: Secondary | ICD-10-CM

## 2022-09-02 ENCOUNTER — Ambulatory Visit: Payer: No Typology Code available for payment source | Admitting: Nurse Practitioner

## 2022-09-02 ENCOUNTER — Ambulatory Visit (HOSPITAL_BASED_OUTPATIENT_CLINIC_OR_DEPARTMENT_OTHER): Payer: No Typology Code available for payment source | Admitting: Cardiovascular Disease

## 2022-09-02 NOTE — Progress Notes (Incomplete)
Cardiology Office Note:   Date:  0000000   ID:  Sandra Vang, DOB [id number], MRN [id number]  PCP:  Martinique, Betty G, MD  Cardiologist:  Skeet Latch, MD  Nephrologist:  Referring MD: Martinique, Betty G, MD   CC: Follow-up  History of Present Illness:    Sandra Vang is a 47 y.o. female with a hx of hypertension, ADD, depression, psoriatic arthritis, OSA, systemic lupus erythematosus, and fibromyalgia here for follow-up. She was initially seen 12/2020 to establish care in the [facility]pert[facility]. She was seen at [facility] 12/19/2020 for viral illness. At the time, she was on lisinopril and amlodipine. Her blood pressure was 175/48.  She saw Dr. Garwin Brothers on 09/2020 and her blood pressure was 164/100 so she was referred to advanced hypertension clinic.  At her initial visit, her blood pressure was elevated but decreased on repeat. She was referred to [facility] and requested a 4 month follow-up. She saw her PCP 08/2020 and complained of palpitations. She was referred to cardiology.  At her last appointment, she was doing well. She was diagnosed with hypertension in 2016. She was taking amlodipine. She was referred for a sleep study because she has known OSA but had not used a CPAP for several years. She was ordered a sleep study but it has not been performed. She recently learned her family has a predisposition to fibromuscular dysplasia. She had a first cousin and maternal uncle with aneurysms and it was discovered on genetic testing. Her mother has vascular dementia. Her grandfather and uncle both had MI events in their 67s. Her mother and many of her siblings also have kidney disease.   We recommended that she stop HCTZ and start spironolactone but she had not yet started it at her appointment 10/2021. She noted a lot of family stressors that limited her ability to make lifestyle interventions. She was started on both spironolactone and metoprolol due to frequent  palpitations. She followed up 11/2021 and home blood pressures were improving but still not at goal. She did not want to increase her medications, and was encouraged to exercise. She noted shortness of breath and swelling and was referred for an echo 11/2021 that revealed LVEF 65-70% and mild asymmetric LVH. She saw Ambrose Pancoast, NP 05/2022 and noted some chest discomfort. She had an ETT 05/2022 that was negative for ischemia.  Today,  She denies any palpitations, chest pain, shortness of breath, or peripheral edema. No lightheadedness, headaches, syncope, orthopnea, or PND.  (+)  Previous antihypertensives: HCTZ- hypokalemia Amlodipine - edema  Past Medical History:  Diagnosis Date   ADD (attention deficit disorder)    B12 deficiency    Chest pain    Chronic headaches    Depression    Fibromyalgia    Gallbladder disease    Hypertension    Insomnia    Joint pain    Lactose intolerance    MRSA (methicillin resistant Staphylococcus aureus) infection    OSA (obstructive sleep apnea) 03/24/2015   Psoriatic arthritis ([place])    Systemic lupus erythematosus ([place]) 10/01/2016   -seeing dermatology and rheumatologist (Dr. Dossie Der)   Vitamin D deficiency     Past Surgical History:  Procedure Laterality Date   APPENDECTOMY  2005   [place]    Current Medications: No outpatient medications have been marked as taking for the 09/02/22 encounter (Appointment) with Skeet Latch, MD.     Allergies:   Iodinated contrast media, Dexamethasone,  Iohexol, Sulfa antibiotics, and Sulfasalazine   Social History   Socioeconomic History   Marital status: Divorced    Spouse name: Not on file   Number of children: Not on file   Years of education: Not on file   Highest education level: Bachelor's degree (e.g., BA, AB, BS)  Occupational History   Occupation: Therapist, sports  Tobacco Use   Smoking status: Never   Smokeless tobacco: Never  Vaping Use   Vaping Use: Never used   Substance and Sexual Activity   Alcohol use: No    Alcohol/week: 0.0 standard drinks of alcohol   Drug use: No   Sexual activity: Not on file  Other Topics Concern   Not on file  Social History Narrative   Work or School: Therapist, sports - Engineer, manufacturing systems, Airline pilot      Home Situation: lives with her son and daughter      Spiritual Beliefs: Christian      Lifestyle: Curves, some exercise and trying to work on diet      Social Determinants of Health   Financial Resource Strain: [place]  (05/13/2022)   Overall Financial Resource Strain (CARDIA)    Difficulty of Paying Living Expenses: Not very hard  Food Insecurity: No Food Insecurity (05/13/2022)   Hunger Vital Sign    Worried About Running Out of Food in the Last Year: Never true    [place] in the Last Year: Never true  Transportation Needs: No Transportation Needs (05/13/2022)   PRAPARE - Hydrologist (Medical): No    Lack of Transportation (Non-Medical): No  Physical Activity: Unknown (05/13/2022)   Exercise Vital Sign    Days of Exercise per Week: 0 days    Minutes of Exercise per Session: Not on file  Stress: Stress Concern Present (05/13/2022)   [place]    Feeling of Stress : Very much  Social Connections: Unknown (05/13/2022)   Social Connection and Isolation Panel [NHANES]    Frequency of Communication with Friends and Family: More than three times a week    Frequency of Social Gatherings with Friends and Family: More than three times a week    Attends Religious Services: Patient refused    Marine scientist or Organizations: No    Attends Music therapist: Not on file    Marital Status: Divorced     Family History: The patient's family history includes Alcohol abuse in her maternal grandfather, maternal grandmother, and mother; Anxiety disorder in her father and mother; Arthritis in her maternal  grandmother and paternal grandmother; Asthma in her brother, daughter, daughter, and paternal grandmother; Cancer in her maternal grandmother and paternal grandmother; Depression in her paternal aunt and paternal uncle; Drug abuse in her father; Emphysema in her father; Heart failure in her maternal grandmother and paternal grandmother; High Cholesterol in her father and mother; Hypertension in her father, maternal grandfather, maternal grandmother, mother, paternal grandfather, and paternal grandmother; Kidney disease in her mother; Multiple sclerosis in her brother.  ROS:   Please see the history of present illness.     All other systems reviewed and are negative.  EKGs/Labs/Other Studies Reviewed:    Left LE Venous Doppler  06/26/2022: Summary:  Left:  - No evidence of deep vein thrombosis seen in the left lower extremity,  from the common femoral through the popliteal veins.  - No evidence of superficial venous reflux seen in the left short  saphenous vein.  - Venous reflux is noted in the left common femoral vein.  - Venous reflux is noted in the left greater saphenous vein in the mid  thigh.  - No evidence of reflux in the AASV.   ETT  06/04/2022:   No ST deviation was noted.   Negative adequate stress test without evidence of ischemia at given workload.  Echocardiogram  12/25/2021:  1. Left ventricular ejection fraction, by estimation, is 65 to 70%. The  left ventricle has normal function. The left ventricle has no regional  wall motion abnormalities. There is mild asymmetric left ventricular  hypertrophy of the septal segment. Left  ventricular diastolic parameters were normal. The average left ventricular  global longitudinal strain is -27.4 %. The global longitudinal strain is  normal.   2. Right ventricular systolic function is normal. The right ventricular  size is normal. There is normal pulmonary artery systolic pressure. The  estimated right ventricular systolic  pressure is [id number] mmHg.   3. The mitral valve is normal in structure. Trivial mitral valve  regurgitation. No evidence of mitral stenosis.   4. The aortic valve is tricuspid. Aortic valve regurgitation is not  visualized. No aortic stenosis is present.   5. The inferior vena cava is normal in size with greater than 50%  respiratory variability, suggesting right atrial pressure of 3 mmHg.   6. Increased flow velocities may be secondary to anemia, thyrotoxicosis,  hyperdynamic or high flow state.   Monitor 08/2021: 4 Day Zio Monitor   Quality: Fair.  Baseline artifact. Predominant rhythm: sinus rhythm Average heart rate: 83 bpm Max heart rate: 137 bpm Min heart rate: 54 bpm Pauses >2.5 seconds: none   Rare PACs and PVCs Ventricular triplet   EKG:   EKG is personally reviewed. 09/02/2022:  *** 01/08/2021: sinus rhythm, rate, 76 bpm  Recent Labs: 05/14/2022: ALT 11; BUN 17; Creatinine, Ser 0.84; Hemoglobin 12.0; Platelets 430.0; Potassium 3.9; Sodium 135; TSH 3.22   Recent Lipid Panel    Component Value Date/Time   CHOL 195 05/14/2022 0839   CHOL 223 (H) 07/07/2019 1256   TRIG 56.0 05/14/2022 0839   HDL 64.20 05/14/2022 0839   HDL 81 07/07/2019 1256   CHOLHDL 3 05/14/2022 0839   VLDL 11.2 05/14/2022 0839   LDLCALC 120 (H) 05/14/2022 0839   LDLCALC 132 (H) 08/10/2021 1626    Physical Exam:   VS:  There were no vitals taken for this visit. , BMI There is no height or weight on file to calculate BMI. GENERAL:  Well appearing HEENT: Pupils equal round and reactive, fundi not visualized, oral mucosa unremarkable NECK:  No jugular venous distention, waveform within normal limits, carotid upstroke brisk and symmetric, no bruits LUNGS:  Clear to auscultation bilaterally HEART:  RRR.  PMI not displaced or sustained,S1 and S2 within normal limits, no S3, no S4, no clicks, no rubs, no murmurs ABD:  Flat, positive bowel sounds normal in frequency in pitch, no bruits, no rebound, no  guarding, no midline pulsatile mass, no hepatomegaly, no splenomegaly EXT:  2 plus pulses throughout, ***1+ LE edema to the ankles, no cyanosis no clubbing SKIN:  No rashes no nodules NEURO:  Cranial nerves II through XII grossly intact, motor grossly intact throughout PSYCH:  Cognitively intact, oriented to person place and time  ASSESSMENT/PLAN:    No problem-specific Assessment & Plan notes found for this encounter.  ***Plan: -  Screening for Secondary Hypertension:     01/08/2021   10:02  AM  Causes     - Comments 1 coffee in AM, salt in eating out  Sleep Apnea Screened  Thyroid Disease Screened  Hyperaldosteronism Not Screened  Pheochromocytoma Not Screened  Cushing's Syndrome Not Screened    Relevant Labs/Studies:    Latest Ref Rng & Units 05/14/2022    8:39 AM 08/10/2021    4:26 PM 09/19/2020    3:32 PM  Basic Labs  Sodium 135 - 145 mEq/L 135  138  139   Potassium 3.5 - 5.1 mEq/L 3.9  3.8  4.0   Creatinine 0.40 - 1.20 mg/dL 0.84  0.81  0.82        Latest Ref Rng & Units 05/14/2022    8:58 AM 08/10/2021    4:26 PM  Thyroid   TSH mIU/L 3.22  1.66      Disposition: FU with Tiffany C. Oval Linsey, MD, [facility] in ***3-4 months  Medication Adjustments/Labs and Tests Ordered: Current medicines are reviewed at length with the patient today.  Concerns regarding medicines are outlined above.   No orders of the defined types were placed in this encounter.  No orders of the defined types were placed in this encounter.  I,Mathew Stumpf,acting as a Education administrator for Skeet Latch, MD.,have documented all relevant documentation on the behalf of Skeet Latch, MD,as directed by  Skeet Latch, MD while in the presence of Skeet Latch, MD.  ***  Signed, Madelin Rear  09/02/2022 2:27 PM    [place]

## 2022-09-22 NOTE — Progress Notes (Unsigned)
Office Visit    Patient Name: Sandra Vang Date of Encounter: 09/22/2022  Primary Care Provider:  Martinique, Betty G, MD Primary Cardiologist:  Skeet Latch, MD Primary Electrophysiologist: None  Chief Complaint    Sandra Vang is a 47 y.o. female with PMH of HTN, systemic lupus erythematosus, fibromyalgia, OSA, psoriatic arthritis, ADD, depression who presents today for    Past Medical History    Past Medical History:  Diagnosis Date   ADD (attention deficit disorder)    B12 deficiency    Chest pain    Chronic headaches    Depression    Fibromyalgia    Gallbladder disease    Hypertension    Insomnia    Joint pain    Lactose intolerance    MRSA (methicillin resistant Staphylococcus aureus) infection    OSA (obstructive sleep apnea) 03/24/2015   Psoriatic arthritis (Sandra Vang)    Systemic lupus erythematosus (Sandra Vang) 10/01/2016   -seeing dermatology and rheumatologist (Sandra Vang)   Vitamin D deficiency    Past Surgical History:  Procedure Laterality Date   APPENDECTOMY  2005   CESAREAN SECTION     TUBAL LIGATION  1998    Allergies  Allergies  Allergen Reactions   Iodinated Contrast Media Anaphylaxis   Dexamethasone     Severe vaginal and rectal burning   Iohexol      Desc: HIVES    Sulfa Antibiotics Hives   Sulfasalazine Hives    History of Present Illness    Sandra Vang  is a 47 year old female with the above mention past medical history who presents today for complaint of chest pain and dizziness.  She was initially seen by Dr. Johnsie Cancel on 08/2012 for complaint of chest pressure.  Stress echo was completed that was normal.  She was seen on 12/2020 by Dr. Oval Linsey for management of hypertension.  She was referred by Dr. Garwin Brothers on 09/2020 due to elevated blood pressure of 164/100.  She was initially diagnosed with HTN in 2016 during visit patient reported headaches that occurred in the morning and reported history of OSA with noncompliance of CPAP. Blood  pressure during visit was 122/68.  She was treated previously on HCTZ and developed hypokalemia. No changes were made to her therapy at that time and she continued amlodipine and was instructed to make lifestyle modifications and increase physical activity.  She was also advised to abstain from excess sodium in her diet.  She was referred for sleep study as well.  She was seen 08/2021 for follow-up and endorsed not doing well with palpitations that she notices primarily at night.  She also described a pounding in her ears and flushing sensation.  She also noted a episode of lightheadedness that progressed to tingling sensation in her mouth and metallic taste with presyncope.  Blood pressure during that time was 149/103. She was started on spironolactone 12.5 mg and potassium was discontinued from her medical list.  She noted a history of fibromuscular dysplasia.  She wore an event monitor that revealed PACs and PVCs but no sustained arrhythmias.  She was seen in follow-up 10/2021 and blood pressure remained uncontrolled.  She continued to struggle with sodium and reported continued palpitations.  She was started on metoprolol 25 mg to help with PVCs and spironolactone was increased to 25 mg.  She was seen by Laurann Montana, NP on 12/11/2021 for video visit.  She reported blood pressure still not at goal and declined transition of metoprolol to carvedilol.  She has  not completed BMP and therefore spironolactone/HCTZ was not titrated.  2D echo was ordered due to complaint of increased lower extremity edema.  2D echo results revealed no significant valvular abnormalities and EF of 65-70% with mild LVH.  She was last seen 05/03/2022 for follow-up with blood pressure still not at goal and BMP not completed for titration purposes of blood pressure medications.  She was referred to [facility] per her request due to possible venous insufficiency.  She was seen on 05/21/2022 for complaint of dizziness and chest discomfort.  She  described a tight feeling in her chest and had no discomfort during her follow-up visit.  Her blood pressure was well-controlled and lower extremity swelling was improved.  She was sent for ETT that was negative for ischemia.  She had a lower extremity ultrasound completed that showed no evidence of DVT.  Sandra Vang presents today with her mother for 19-month follow-up.  Since last being seen in the office patient reports that she has been doing much better and has experienced less palpitations. Her blood pressure has been well-controlled and is 106/64 today.  She recently took her medications prior to today's visit and notes that at home her systolic is in the [id number].  She is compliant with her current medications and reports no new complications.  She has recently however experienced an occasional episode of presyncope with no prodromal symptoms.  She also endorses some fatigue that she reports it has been ongoing and is not new but is concerned that it may be related to her blood pressure medications.  She denies any chest discomfort or palpitations with this episode.  She is euvolemic today on examination and reports no indiscretions with salt in her diet.  She is interested in performing home exercises for managing her cardiovascular disease.  I advised her regarding the prep program and will offer her information to consider in the future.  Patient denies  palpitations, dyspnea, PND, orthopnea, nausea, vomiting, dizziness, syncope, edema, weight gain, or early satiety.   Home Medications    Current Outpatient Medications  Medication Sig Dispense Refill   carvedilol (COREG) 3.125 MG tablet TAKE 1 TABLET BY MOUTH 2 TIMES DAILY. 180 tablet 3   hydrochlorothiazide (HYDRODIURIL) 25 MG tablet Take 1 tablet (25 mg total) by mouth daily. 90 tablet 3   OVER THE COUNTER MEDICATION Turmeric 1500mg      OVER THE COUNTER MEDICATION Elderberry syrup with other herbs as needed     spironolactone (ALDACTONE) 50 MG  tablet Take 1 tablet (50 mg total) by mouth daily. 90 tablet 3   No current facility-administered medications for this visit.     Review of Systems  Please see the history of present illness.    (+) Fatigue  (+) Occasional dizziness  All other systems reviewed and are otherwise negative except as noted above.  Physical Exam    Wt Readings from Last 3 Encounters:  06/26/22 198 lb (89.8 kg)  05/21/22 199 lb (90.3 kg)  05/17/22 203 lb 2 oz (92.1 kg)   [id number] were no vitals filed for this visit.,There is no height or weight on file to calculate BMI.  Constitutional:      Appearance: Healthy appearance. Not in distress.  Neck:     Vascular: JVD normal.  Pulmonary:     Effort: Pulmonary effort is normal.     Breath sounds: No wheezing. No rales. Diminished in the bases Cardiovascular:     Normal rate. Regular rhythm. Normal S1. Normal S2.  Murmurs: There is no murmur.  Edema:    Peripheral edema absent.  Abdominal:     Palpations: Abdomen is soft non tender. There is no hepatomegaly.  Skin:    General: Skin is warm and dry.  Neurological:     General: No focal deficit present.     Mental Status: Alert and oriented to person, place and time.     Cranial Nerves: Cranial nerves are intact.  EKG/LABS/ Recent Cardiac Studies    ECG personally reviewed by me today -none completed today  Risk Assessment/Calculations:    Lab Results  Component Value Date   WBC 8.8 05/14/2022   HGB 12.0 05/14/2022   HCT 37.6 05/14/2022   MCV 85.9 05/14/2022   PLT 430.0 (H) 05/14/2022   Lab Results  Component Value Date   CREATININE 0.84 05/14/2022   BUN 17 05/14/2022   NA 135 05/14/2022   K 3.9 05/14/2022   CL 101 05/14/2022   CO2 28 05/14/2022   Lab Results  Component Value Date   ALT 11 05/14/2022   AST 18 05/14/2022   ALKPHOS 80 05/14/2022   BILITOT 0.3 05/14/2022   Lab Results  Component Value Date   CHOL 195 05/14/2022   HDL 64.20 05/14/2022   LDLCALC 120 (H)  05/14/2022   TRIG 56.0 05/14/2022   CHOLHDL 3 05/14/2022    Lab Results  Component Value Date   HGBA1C 6.0 05/14/2022    Cardiac Studies & Procedures     STRESS TESTS  EXERCISE TOLERANCE TEST (ETT) 06/06/2022  Narrative   No ST deviation was noted.  Negative adequate stress test without evidence of ischemia at given workload.   ECHOCARDIOGRAM  ECHOCARDIOGRAM COMPLETE 12/25/2021  Narrative ECHOCARDIOGRAM REPORT    Patient Name:   DENAJA HOHL Date of Exam: 12/25/2021 Medical Rec #:  [id number]        Height:       61.0 in Accession #:    [id number]       Weight:       190.0 lb Date of Birth:  1976/03/17         BSA:          1.848 m Patient Age:    20 years         BP:           165/96 mmHg Patient Gender: F                HR:           75 bpm. Exam Location:  [place]  Procedure: 2D Echo, 3D Echo, Cardiac Doppler, Color Doppler and Strain Analysis  Indications:    R06.09 Dyspnea on exertion  History:        Patient has no prior history of Echocardiogram examinations. Risk Factors:Hypertension and Sleep Apnea. Obese. Lupus.  Sonographer:    Basilia Jumbo BS, RDCS Referring Phys: [id number] [place]   1. Left ventricular ejection fraction, by estimation, is 65 to 70%. The left ventricle has normal function. The left ventricle has no regional wall motion abnormalities. There is mild asymmetric left ventricular hypertrophy of the septal segment. Left ventricular diastolic parameters were normal. The average left ventricular global longitudinal strain is -27.4 %. The global longitudinal strain is normal. 2. Right ventricular systolic function is normal. The right ventricular size is normal. There is normal pulmonary artery systolic pressure. The estimated right ventricular systolic pressure is Q000111Q mmHg. 3. The mitral valve is normal in  structure. Trivial mitral valve regurgitation. No evidence of mitral stenosis. 4. The aortic valve is tricuspid.  Aortic valve regurgitation is not visualized. No aortic stenosis is present. 5. The inferior vena cava is normal in size with greater than 50% respiratory variability, suggesting right atrial pressure of 3 mmHg. 6. Increased flow velocities may be secondary to anemia, thyrotoxicosis, hyperdynamic or high flow state.  FINDINGS Left Ventricle: Left ventricular ejection fraction, by estimation, is 65 to 70%. The left ventricle has normal function. The left ventricle has no regional wall motion abnormalities. The average left ventricular global longitudinal strain is -27.4 %. The global longitudinal strain is normal. 3D left ventricular ejection fraction analysis performed but not reported based on interpreter judgement due to suboptimal tracking. The left ventricular internal cavity size was normal in size. There is mild asymmetric left ventricular hypertrophy of the septal segment. Left ventricular diastolic parameters were normal.  Right Ventricle: The right ventricular size is normal. No increase in right ventricular wall thickness. Right ventricular systolic function is normal. There is normal pulmonary artery systolic pressure. The tricuspid regurgitant velocity is 2.50 m/s, and with an assumed right atrial pressure of 3 mmHg, the estimated right ventricular systolic pressure is Q000111Q mmHg.  Left Atrium: Left atrial size was normal in size.  Right Atrium: Right atrial size was normal in size.  Pericardium: There is no evidence of pericardial effusion.  Mitral Valve: The mitral valve is normal in structure. Trivial mitral valve regurgitation. No evidence of mitral valve stenosis.  Tricuspid Valve: The tricuspid valve is normal in structure. Tricuspid valve regurgitation is mild . No evidence of tricuspid stenosis.  Aortic Valve: The aortic valve is tricuspid. Aortic valve regurgitation is not visualized. No aortic stenosis is present.  Pulmonic Valve: The pulmonic valve was normal in  structure. Pulmonic valve regurgitation is trivial. No evidence of pulmonic stenosis.  Aorta: The aortic root is normal in size and structure.  Venous: The inferior vena cava is normal in size with greater than 50% respiratory variability, suggesting right atrial pressure of 3 mmHg.  IAS/Shunts: The atrial septum is grossly normal.   LEFT VENTRICLE PLAX 2D LVIDd:         4.00 cm   Diastology LVIDs:         2.50 cm   LV e' medial:    9.14 cm/s LV PW:         1.10 cm   LV E/e' medial:  10.4 LV IVS:        1.20 cm   LV e' lateral:   22.00 cm/s LVOT diam:     2.20 cm   LV E/e' lateral: 4.3 LV SV:         102 LV SV Index:   55        2D Longitudinal Strain LVOT Area:     3.80 cm  2D Strain GLS (A2C):   -31.1 % 2D Strain GLS (A3C):   -22.7 % 2D Strain GLS (A4C):   -28.5 % 2D Strain GLS Avg:     -27.4 %  3D Volume EF: 3D EF:        59 % LV EDV:       127 ml LV ESV:       52 ml LV SV:        74 ml  RIGHT VENTRICLE             IVC RV Basal diam:  3.70 cm     IVC diam:  1.30 cm RV S prime:     12.80 cm/s TAPSE (M-mode): 2.3 cm RVSP:           28.0 mmHg  LEFT ATRIUM             Index        RIGHT ATRIUM           Index LA diam:        3.60 cm 1.95 cm/m   RA Pressure: 3.00 mmHg LA Vol (A2C):   48.9 ml 26.46 ml/m  RA Area:     11.60 cm LA Vol (A4C):   52.8 ml 28.57 ml/m  RA Volume:   28.80 ml  15.58 ml/m LA Biplane Vol: 51.3 ml 27.76 ml/m AORTIC VALVE LVOT Vmax:   129.00 cm/s LVOT Vmean:  91.200 cm/s LVOT VTI:    0.268 m  AORTA Ao Root diam: 2.90 cm Ao Asc diam:  2.90 cm  MITRAL VALVE               TRICUSPID VALVE TR Peak grad:   25.0 mmHg MV Decel Time: 144 msec    TR Vmax:        250.00 cm/s MV E velocity: 95.40 cm/s  Estimated RAP:  3.00 mmHg MV A velocity: 57.10 cm/s  RVSP:           28.0 mmHg MV E/A ratio:  1.67 SHUNTS Systemic VTI:  0.27 m Systemic Diam: 2.20 cm  Cherlynn Kaiser MD Electronically signed by Cherlynn Kaiser MD Signature Date/Time:  12/25/2021/4:25:57 PM    Final    MONITORS  LONG TERM MONITOR (3-14 DAYS) 10/14/2021  Narrative 4 Day Zio Monitor  Quality: Fair.  Baseline artifact. Predominant rhythm: sinus rhythm Average heart rate: 83 bpm Max heart rate: 137 bpm Min heart rate: 54 bpm Pauses >2.5 seconds: none  Rare PACs and PVCs Ventricular triplet  Tiffany C. Oval Linsey, MD, [facility] 10/14/2021 4:36 PM           Assessment & Plan    1.  Essential hypertension: -Patient's blood pressure today is well-controlled at 106/64. -Continue spironolactone 50 mg,HCTZ 25 mg daily and Coreg 3.125 mg twice daily -Continue to check blood pressures with a goal of 130/80 or less.  2.  Lower extremity edema: -Today she reports improvement with swelling. -She was euvolemic on examination today.  She was advised to abstain from excess salt and to continue to elevate her extremities and use compression stockings as needed.   3.  History of OSA: -Patient currently using CPAP -She reports some increased fatigue but is compliant with her appliance.   4.  Chest pain/fatigue: -Today patient reports occasional episodes of chest pain and increased fatigue. -Previous ETT was completed showing no evidence of ischemia -She does have complaint of increased fatigue and we will obtain cardiac CTA due to increased risk factors with OSA, hypertension, for further restratification.   5.  Dizziness: -Patient continues to have complaint of dizziness with presyncopal episode that occurred 3 weeks prior. -We will complete 14-day ZIO monitor to evaluate for possible pauses or arrhythmia -She was advised to change positions slowly decrease any rapid motion with her head. -She was also encouraged to hydrate increase physical activity as tolerated.  Disposition: Follow-up with Skeet Latch, MD or APP in 3 months   Medication Adjustments/Labs and Tests Ordered: Current medicines are reviewed at length with the patient today.  Concerns  regarding medicines are outlined above.   Signed, Mable Fill, Marissa Nestle, NP 09/22/2022,  8:49 AM [facility]  Note:  This document was prepared using Dragon voice recognition software and may include unintentional dictation errors. Follow-up on

## 2022-09-23 ENCOUNTER — Encounter: Payer: Self-pay | Admitting: Nurse Practitioner

## 2022-09-23 ENCOUNTER — Telehealth: Payer: Self-pay | Admitting: Nurse Practitioner

## 2022-09-23 ENCOUNTER — Ambulatory Visit: Payer: No Typology Code available for payment source | Attending: Nurse Practitioner | Admitting: Nurse Practitioner

## 2022-09-23 VITALS — BP 106/64 | HR 82 | Ht 61.0 in | Wt 195.4 lb

## 2022-09-23 DIAGNOSIS — G4733 Obstructive sleep apnea (adult) (pediatric): Secondary | ICD-10-CM

## 2022-09-23 DIAGNOSIS — R600 Localized edema: Secondary | ICD-10-CM | POA: Diagnosis not present

## 2022-09-23 DIAGNOSIS — R079 Chest pain, unspecified: Secondary | ICD-10-CM

## 2022-09-23 DIAGNOSIS — R072 Precordial pain: Secondary | ICD-10-CM

## 2022-09-23 DIAGNOSIS — I10 Essential (primary) hypertension: Secondary | ICD-10-CM | POA: Diagnosis not present

## 2022-09-23 DIAGNOSIS — R42 Dizziness and giddiness: Secondary | ICD-10-CM

## 2022-09-23 MED ORDER — SPIRONOLACTONE 50 MG PO TABS: 50.0000 mg | ORAL_TABLET | Freq: Every day | ORAL | 3 refills | Status: DC

## 2022-09-23 MED ORDER — CARVEDILOL 3.125 MG PO TABS: 3.1250 mg | ORAL_TABLET | Freq: Two times a day (BID) | ORAL | 3 refills | Status: DC

## 2022-09-23 MED ORDER — HYDROCHLOROTHIAZIDE 25 MG PO TABS: 25.0000 mg | ORAL_TABLET | Freq: Every day | ORAL | 3 refills | Status: DC

## 2022-09-23 NOTE — Patient Instructions (Signed)
Medication Instructions:  Your physician recommends that you continue on your current medications as directed. Please refer to the Current Medication list given to you today. *If you need a refill on your cardiac medications before your next appointment, please call your pharmacy*   Lab Work: None ordered If you have labs (blood work) drawn today and your tests are completely normal, you will receive your results only by: [place] (if you have MyChart) OR A paper copy in the mail If you have any lab test that is abnormal or we need to change your treatment, we will call you to review the results.   Testing/Procedures: Will be ordering Coronary CTA wait to hear back from our office  Your physician has requested that you have a carotid duplex. This test is an ultrasound of the carotid arteries in your neck. It looks at blood flow through these arteries that supply the brain with blood. Allow one hour for this exam. There are no restrictions or special instructions.  ZIO XT- Long Term Monitor Instructions  Your physician has requested you wear a ZIO patch monitor for 14 days.  This is a single patch monitor. [employer] supplies one patch monitor per enrollment. Additional stickers are not available. Please do not apply patch if you will be having a Nuclear Stress Test,  Echocardiogram, Cardiac CT, MRI, or Chest Xray during the period you would be wearing the  monitor. The patch cannot be worn during these tests. You cannot remove and re-apply the  ZIO XT patch monitor.  Your ZIO patch monitor will be mailed 3 day USPS to your address on file. It may take 3-5 days  to receive your monitor after you have been enrolled.  Once you have received your monitor, please review the enclosed instructions. Your monitor  has already been registered assigning a specific monitor serial # to you.  Billing and Patient Assistance Program Information  We have supplied [employer] with any of your  insurance information on file for billing purposes. [employer] offers a sliding scale Patient Assistance Program for patients that do not have  insurance, or whose insurance does not completely cover the cost of the ZIO monitor.  You must apply for the Patient Assistance Program to qualify for this discounted rate.  To apply, please call [employer] at [phone], select option 4, select option 2, ask to apply for  Patient Assistance Program. Theodore Demark will ask your household income, and how many people  are in your household. They will quote your out-of-pocket cost based on that information.  [employer] will also be able to set up a 4-month, interest-free payment plan if needed.  Applying the monitor   Shave hair from upper left chest.  Hold abrader disc by orange tab. Rub abrader in 40 strokes over the upper left chest as  indicated in your monitor instructions.  Clean area with 4 enclosed alcohol pads. Let dry.  Apply patch as indicated in monitor instructions. Patch will be placed under collarbone on left  side of chest with arrow pointing upward.  Rub patch adhesive wings for 2 minutes. Remove white label marked "1". Remove the white  label marked "2". Rub patch adhesive wings for 2 additional minutes.  While looking in a mirror, press and release button in center of patch. A small green light will  flash 3-4 times. This will be your only indicator that the monitor has been turned on.  Do not shower for the first 24 hours. You may shower after the  first 24 hours.  Press the button if you feel a symptom. You will hear a small click. Record Date, Time and  Symptom in the Patient Logbook.  When you are ready to remove the patch, follow instructions on the last 2 pages of Patient  Logbook. Stick patch monitor onto the last page of Patient Logbook.  Place Patient Logbook in the blue and white box. Use locking tab on box and tape box closed  securely. The blue and white box has prepaid postage on  it. Please place it in the mailbox as  soon as possible. Your physician should have your test results approximately 7 days after the  monitor has been mailed back to [facility].  Call [place] at [phone] if you have questions regarding  your ZIO XT patch monitor. Call them immediately if you see an orange light blinking on your  monitor.  If your monitor falls off in less than 4 days, contact our Monitor department at [phone].  If your monitor becomes loose or falls off after 4 days call [employer] at [phone] for  suggestions on securing your monitor   Follow-Up: At [facility], you and your health needs are our priority.  As part of our continuing mission to provide you with exceptional heart care, we have created designated Provider Care Teams.  These Care Teams include your primary Cardiologist (physician) and Advanced Practice Providers (APPs -  Physician Assistants and Nurse Practitioners) who all work together to provide you with the care you need, when you need it.  We recommend signing up for the patient portal called "MyChart".  Sign up information is provided on this After Visit Summary.  MyChart is used to connect with patients for Virtual Visits (Telemedicine).  Patients are able to view lab/test results, encounter notes, upcoming appointments, etc.  Non-urgent messages can be sent to your provider as well.   To learn more about what you can do with MyChart, go to [email or web address].    Your next appointment:   3 month(s)  Provider:   Ambrose Pancoast, NP       Other Instructions

## 2022-09-23 NOTE — Telephone Encounter (Signed)
Please contact Sandra Vang and let her know that we can proceed with cardiac CTA.  She will require premedication with prednisone 50 mg given at 13 hours, 7 hours and 1 hour prior to CT.  She will also require 50 mg of Benadryl 1 hour prior to CT.  Please order CMET for patient and cardiac CTA with indication of chest pain.  Please let me know if have any questions.  Ambrose Pancoast, NP

## 2022-09-24 MED ORDER — METOPROLOL TARTRATE 50 MG PO TABS: 50.0000 mg | ORAL_TABLET | Freq: Once | ORAL | 0 refills | Status: DC

## 2022-09-24 MED ORDER — PREDNISONE 50 MG PO TABS: ORAL_TABLET | ORAL | 0 refills | Status: DC

## 2022-09-24 NOTE — Addendum Note (Signed)
Addended by: Whitman Hero on: 09/24/2022 12:13 PM   Modules accepted: Orders

## 2022-09-24 NOTE — Addendum Note (Signed)
Addended by: Whitman Hero on: 09/24/2022 02:06 PM   Modules accepted: Orders

## 2022-09-24 NOTE — Telephone Encounter (Signed)
Spoke with the patient and discussed coronary CTA instructions. Patient agreeable and voiced understanding.

## 2022-09-24 NOTE — Telephone Encounter (Addendum)
INSTRUCTIONS FOR CORONARY CTA Please arrive at the [facility] Tower main entrance of [facility] at (30-45 minutes prior to test start time)  [facility] [address] [place],  [address] [phone]  Proceed to the  Radiology Department (First Floor).  Please follow these instructions carefully (unless otherwise directed): PLEASE HAVE LABS - BMP  AT LEAST ONE WEEK PRIOR TO TEST  On the Night Before the Test: Drink plenty of water. Do not consume any caffeinated/decaffeinated beverages or chocolate 12 hours prior to your test. Do not take any antihistamines 12 hours prior to your test.  On the Day of the Test: Drink plenty of water. Do not drink any water within one hour of the test. Do not eat any food 4 hours prior to the test. You may take your regular medications prior to the test. Take 50 mg of Lopressor (Metoprolol) one hour before the test.  After the Test: Drink plenty of water. After receiving IV contrast, you may experience a mild flushed feeling. This is normal. On occasion, you may experience a mild rash up to 24 hours after the test. This is not dangerous. If this occurs, you can take Benadryl 25 mg and increase your fluid intake. If you experience trouble breathing, this can be serious. If it is severe call 911 IMMEDIATELY. If it is mild, please call our office.  

## 2022-10-11 ENCOUNTER — Telehealth (HOSPITAL_COMMUNITY): Payer: Self-pay | Admitting: *Deleted

## 2022-10-11 NOTE — Telephone Encounter (Signed)
Attempted to call patient regarding upcoming cardiac CT appointment. °Left message on voicemail with name and callback number ° °Cambrea Kirt RN Navigator Cardiac Imaging °[facility] °[phone] Office °[phone] Cell ° °

## 2022-10-11 NOTE — Telephone Encounter (Signed)
Patient returning call about her upcoming cardiac imaging study; pt verbalizes understanding of appt date/time, parking situation and where to check in, pre-test NPO status and medications ordered, and verified current allergies; name and call back number provided for further questions should they arise  Larey Brick RN Navigator Cardiac Imaging [facility] [phone] office [phone] cell  Reviewed how to take 13 hour prep and metoprolol tartrate two hours prior to her cardiac CT scan. She is to take medications at 10:30pm, 4:30am, 9:30am, 10:30am. She is aware to arrive 11am.

## 2022-10-14 ENCOUNTER — Ambulatory Visit (HOSPITAL_COMMUNITY)
Admission: RE | Admit: 2022-10-14 | Discharge: 2022-10-14 | Disposition: A | Discharge status: Home / Self Care | Payer: No Typology Code available for payment source | Source: Ambulatory Visit | Attending: Nurse Practitioner | Admitting: Nurse Practitioner

## 2022-10-14 ENCOUNTER — Ambulatory Visit (HOSPITAL_BASED_OUTPATIENT_CLINIC_OR_DEPARTMENT_OTHER)
Admission: RE | Admit: 2022-10-14 | Discharge: 2022-10-14 | Disposition: A | Discharge status: Home / Self Care | Payer: No Typology Code available for payment source | Source: Ambulatory Visit | Attending: Nurse Practitioner | Admitting: Nurse Practitioner

## 2022-10-14 DIAGNOSIS — R42 Dizziness and giddiness: Secondary | ICD-10-CM

## 2022-10-14 DIAGNOSIS — R072 Precordial pain: Secondary | ICD-10-CM

## 2022-10-14 DIAGNOSIS — R079 Chest pain, unspecified: Secondary | ICD-10-CM

## 2022-10-14 DIAGNOSIS — I10 Essential (primary) hypertension: Secondary | ICD-10-CM | POA: Insufficient documentation

## 2022-10-14 DIAGNOSIS — G4733 Obstructive sleep apnea (adult) (pediatric): Secondary | ICD-10-CM

## 2022-10-14 DIAGNOSIS — R600 Localized edema: Secondary | ICD-10-CM | POA: Insufficient documentation

## 2022-10-14 MED ORDER — IOHEXOL 350 MG/ML SOLN
100.0000 mL | Freq: Once | INTRAVENOUS | Status: AC | PRN
  Administered 2022-10-14: 100 mL via INTRAVENOUS

## 2022-10-14 MED ORDER — FAMOTIDINE 20 MG PO TABS
40.0000 mg | ORAL_TABLET | Freq: Once | ORAL | Status: AC
  Administered 2022-10-14: 40 mg via ORAL
  Filled 2022-10-14: qty 1
  Filled 2022-10-14: qty 2

## 2022-10-14 MED ORDER — NITROGLYCERIN 0.4 MG SL SUBL
SUBLINGUAL_TABLET | SUBLINGUAL | Status: AC
  Filled 2022-10-14: qty 2

## 2022-10-14 MED ORDER — NITROGLYCERIN 0.4 MG SL SUBL
0.8000 mg | SUBLINGUAL_TABLET | Freq: Once | SUBLINGUAL | Status: AC
  Administered 2022-10-14: 0.8 mg via SUBLINGUAL

## 2022-10-14 MED ORDER — DIPHENHYDRAMINE HCL 50 MG/ML IJ SOLN
25.0000 mg | Freq: Once | INTRAMUSCULAR | Status: AC
  Administered 2022-10-14: 25 mg via INTRAVENOUS

## 2022-10-14 MED ORDER — DIPHENHYDRAMINE HCL 50 MG/ML IJ SOLN
INTRAMUSCULAR | Status: AC
  Filled 2022-10-14: qty 1

## 2022-10-14 NOTE — Progress Notes (Signed)
Pt not symptomatic post contrast reaction, treatment and observation. Evaluated by Anders Grant, NP and cleared for discharge. IV removed and pt escorted to entrance A. Son to pick up transport home.

## 2022-10-14 NOTE — Progress Notes (Signed)
Patient here for CT heart. Patient previously had an allergic reaction to contrast. Patient was given 13 hour prep prior to study.  After receiving contrast she reported that her mouth/throat felt "tingly" NP was notified and came over to CT.  25 mg IV benadryl was ordered and given in CT.   Once back at the nurses station Pepcid was ordered as well and awaiting arrival from pharmacy. Patient reports feeling better and is being monitored.

## 2022-10-14 NOTE — Procedures (Addendum)
47 y.o. female outpatient. 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 Presented to IR for Cardiac CT. Patient  verbalized taking her premedications as prescribed (prednisone 50 mg po 13 hours, 7 hours and 1 hour prior to scan and 50 mg of benadryl po 1 hour prior to scan). After initial contrast was administered the Patient reported an "itchy feeling" to the back of her throat. The scan was terminated.  Patient seen in CT scanner 1. No signs of respiratory distress. Patient able to talk in full sentences. No use of accessory muscles. 25 mg of benadryl ordered IV and 40 mg of Pepcid po. Patient transferred to IR holding room where she was placed on the monitor for observation. Should patient condition not improve or worsen she will be sent to the ED for further evaluation and possible intervention. Patient verbalized understanding and is in agreement with the plan of care.    IR Attending Dr. Jacqualine Code made aware and in agreement with the plan of care.

## 2022-10-22 ENCOUNTER — Ambulatory Visit (AMBULATORY_SURGERY_CENTER): Payer: No Typology Code available for payment source

## 2022-10-22 ENCOUNTER — Encounter: Payer: Self-pay | Admitting: Gastroenterology

## 2022-10-22 VITALS — Ht 61.0 in | Wt 192.0 lb

## 2022-10-22 DIAGNOSIS — E611 Iron deficiency: Secondary | ICD-10-CM | POA: Insufficient documentation

## 2022-10-22 DIAGNOSIS — Z1211 Encounter for screening for malignant neoplasm of colon: Secondary | ICD-10-CM

## 2022-10-22 DIAGNOSIS — R1013 Epigastric pain: Secondary | ICD-10-CM | POA: Insufficient documentation

## 2022-10-22 DIAGNOSIS — E519 Thiamine deficiency, unspecified: Secondary | ICD-10-CM | POA: Insufficient documentation

## 2022-10-22 DIAGNOSIS — R0600 Dyspnea, unspecified: Secondary | ICD-10-CM | POA: Insufficient documentation

## 2022-10-22 DIAGNOSIS — R5383 Other fatigue: Secondary | ICD-10-CM | POA: Insufficient documentation

## 2022-10-22 MED ORDER — NA SULFATE-K SULFATE-MG SULF 17.5-3.13-1.6 GM/177ML PO SOLN: 1.0000 | Freq: Once | ORAL | 0 refills | Status: DC

## 2022-10-22 MED ORDER — NA SULFATE-K SULFATE-MG SULF 17.5-3.13-1.6 GM/177ML PO SOLN: 1.0000 | Freq: Once | ORAL | 0 refills | Status: AC

## 2022-10-22 NOTE — Progress Notes (Signed)
No egg or soy allergy known to patient  No issues known to pt with past sedation with any surgeries or procedures   Patient denies ever being told they had issues or difficulty with intubation  No FH of Malignant Hyperthermia Pt is not on diet pills Pt is not on  home 02  Pt is not on blood thinners  Pt with intermit constipation  No A fib or A flutter Have any cardiac testing pending--no Pt instructed to use Singlecare.com or GoodRx for a price reduction on prep

## 2022-10-28 ENCOUNTER — Encounter: Payer: No Typology Code available for payment source | Admitting: Gastroenterology

## 2022-11-19 ENCOUNTER — Encounter: Payer: Self-pay | Admitting: Gastroenterology

## 2022-11-19 ENCOUNTER — Ambulatory Visit: Payer: No Typology Code available for payment source | Admitting: Gastroenterology

## 2022-11-19 VITALS — BP 125/75 | HR 77 | Temp 97.5°F | Resp 29 | Ht 61.0 in | Wt 192.0 lb

## 2022-11-19 DIAGNOSIS — Z1211 Encounter for screening for malignant neoplasm of colon: Secondary | ICD-10-CM | POA: Diagnosis present

## 2022-11-19 MED ORDER — SODIUM CHLORIDE 0.9 % IV SOLN: 500.0000 mL | INTRAVENOUS | Status: DC

## 2022-11-19 NOTE — Progress Notes (Signed)
Patient positioned self to comfort with dependent shoulder comfortable prior to sedation.   Uneventful anesthetic. Report to pacu rn. Vss. Care resumed by rn.

## 2022-11-19 NOTE — Patient Instructions (Addendum)
Resume previous diet.  Continue present medications.  Repeat colonoscopy in 10 years for surveillance.   YOU HAD AN ENDOSCOPIC PROCEDURE TODAY AT THE [facility]:   Refer to the procedure report that was given to you for any specific questions about what was found during the examination.  If the procedure report does not answer your questions, please call your gastroenterologist to clarify.  If you requested that your care partner not be given the details of your procedure findings, then the procedure report has been included in a sealed envelope for you to review at your convenience later.  YOU SHOULD EXPECT: Some feelings of bloating in the abdomen. Passage of more gas than usual.  Walking can help get rid of the air that was put into your GI tract during the procedure and reduce the bloating. If you had a lower endoscopy (such as a colonoscopy or flexible sigmoidoscopy) you may notice spotting of blood in your stool or on the toilet paper. If you underwent a bowel prep for your procedure, you may not have a normal bowel movement for a few days.  Please Note:  You might notice some irritation and congestion in your nose or some drainage.  This is from the oxygen used during your procedure.  There is no need for concern and it should clear up in a day or so.  SYMPTOMS TO REPORT IMMEDIATELY:  Following lower endoscopy (colonoscopy or flexible sigmoidoscopy):  Excessive amounts of blood in the stool  Significant tenderness or worsening of abdominal pains  Swelling of the abdomen that is new, acute  Fever of 100F or higher  For urgent or emergent issues, a gastroenterologist can be reached at any hour by calling [phone]. Do not use MyChart messaging for urgent concerns.    DIET:  We do recommend a small meal at first, but then you may proceed to your regular diet.  Drink plenty of fluids but you should avoid alcoholic beverages for 24 hours.  ACTIVITY:  You should plan to  take it easy for the rest of today and you should NOT DRIVE or use heavy machinery until tomorrow (because of the sedation medicines used during the test).    FOLLOW UP: Our staff will call the number listed on your records the next business day following your procedure.  We will call around 7:15- 8:00 am to check on you and address any questions or concerns that you may have regarding the information given to you following your procedure. If we do not reach you, we will leave a message.     If any biopsies were taken you will be contacted by phone or by letter within the next 1-3 weeks.  Please call us at [phone] if you have not heard about the biopsies in 3 weeks.    SIGNATURES/CONFIDENTIALITY: You and/or your care partner have signed paperwork which will be entered into your electronic medical record.  These signatures attest to the fact that that the information above on your After Visit Summary has been reviewed and is understood.  Full responsibility of the confidentiality of this discharge information lies with you and/or your care-partner. 

## 2022-11-19 NOTE — Op Note (Signed)
[facility] Patient Name: Sandra Vang Procedure Date: 11/19/2022 10:33 AM MRN: [id number] Endoscopist: Napoleon Form , MD, [id number] Age: 47 Referring MD:  Date of Birth: 05/25/1976 Gender: Female Account #: [id number] Procedure:                Colonoscopy Indications:              Screening for colorectal malignant neoplasm Medicines:                Monitored Anesthesia Care Procedure:                Pre-Anesthesia Assessment:                           - Prior to the procedure, a History and Physical                            was performed, and patient medications and                            allergies were reviewed. The patient's tolerance of                            previous anesthesia was also reviewed. The risks                            and benefits of the procedure and the sedation                            options and risks were discussed with the patient.                            All questions were answered, and informed consent                            was obtained. Prior Anticoagulants: The patient has                            taken no anticoagulant or antiplatelet agents. ASA                            Grade Assessment: II - A patient with mild systemic                            disease. After reviewing the risks and benefits,                            the patient was deemed in satisfactory condition to                            undergo the procedure.                           After obtaining informed consent, the colonoscope  was passed under direct vision. Throughout the                            procedure, the patient's blood pressure, pulse, and                            oxygen saturations were monitored continuously. The                            Olympus PCF-H190DL ([id number]) Colonoscope was                            introduced through the anus and advanced to the the                             cecum, identified by appendiceal orifice and                            ileocecal valve. The colonoscopy was performed                            without difficulty. The patient tolerated the                            procedure well. The quality of the bowel                            preparation was good. The ileocecal valve,                            appendiceal orifice, and rectum were photographed. Scope In: 10:43:04 AM Scope Out: 10:56:45 AM Scope Withdrawal Time: 0 hours 7 minutes 56 seconds  Total Procedure Duration: 0 hours 13 minutes 41 seconds  Findings:                 The perianal and digital rectal examinations were                            normal.                           A few small-mouthed diverticula were found in the                            sigmoid colon.                           Non-bleeding external and internal hemorrhoids were                            found during retroflexion. The hemorrhoids were                            small.  The exam was otherwise without abnormality. Complications:            No immediate complications. Estimated Blood Loss:     Estimated blood loss: none. Impression:               - Diverticulosis in the sigmoid colon.                           - Non-bleeding external and internal hemorrhoids.                           - The examination was otherwise normal.                           - No specimens collected. Recommendation:           - Patient has a contact number available for                            emergencies. The signs and symptoms of potential                            delayed complications were discussed with the                            patient. Return to normal activities tomorrow.                            Written discharge instructions were provided to the                            patient.                           - Resume previous diet.                           - Continue  present medications.                           - Repeat colonoscopy in 10 years for surveillance. Napoleon Form, MD 11/19/2022 11:07:23 AM This report has been signed electronically.

## 2022-11-19 NOTE — Progress Notes (Signed)
[facility] History and Physical   Primary Care Physician:  Penelope Galas, MD   Reason for Procedure:  Colorectal cancer screening  Plan:    Screening colonoscopy with possible interventions as needed     HPI: Sandra Vang is a very pleasant 47 y.o. female here for screening colonoscopy. Denies any nausea, vomiting, abdominal pain, melena or bright red blood per rectum  The risks and benefits as well as alternatives of endoscopic procedure(s) have been discussed and reviewed. All questions answered. The patient agrees to proceed.    Past Medical History:  Diagnosis Date   ADD (attention deficit disorder)    B12 deficiency    Chest pain    Chronic headaches    Depression    Fibromyalgia    Gallbladder disease    Heart murmur    Hypertension    Insomnia    Joint pain    Lactose intolerance    MRSA (methicillin resistant Staphylococcus aureus) infection    OSA (obstructive sleep apnea) 03/24/2015   Psoriatic arthritis (HCC)    Sleep apnea    Systemic lupus erythematosus (HCC) 10/01/2016   -seeing dermatology and rheumatologist (Dr. Kathi Ludwig)   Vitamin D deficiency     Past Surgical History:  Procedure Laterality Date   APPENDECTOMY  2005   CESAREAN SECTION     TUBAL LIGATION  1998    Prior to Admission medications   Medication Sig Start Date End Date Taking? Authorizing Provider  carvedilol (COREG) 3.125 MG tablet Take 1 tablet (3.125 mg total) by mouth 2 (two) times daily. 09/23/22  Yes Gaston Islam., NP  Cholecalciferol (VITAMIN D3) 250 MCG (10000 UT) TABS Take 1 tablet by mouth every other day.   Yes [provider]  hydrochlorothiazide (HYDRODIURIL) 25 MG tablet Take 1 tablet (25 mg total) by mouth daily. 09/23/22  Yes Gaston Islam., NP  MAGNESIUM PO Take 1 tablet by mouth daily at 6 (six) AM. 07/17/17  Yes [provider]  Multiple Vitamin (MULTI-VITAMIN) tablet 1 tablet daily. 10/28/17  Yes [provider]   OVER THE COUNTER MEDICATION Turmeric 1500mg    Yes [provider]  spironolactone (ALDACTONE) 50 MG tablet Take 1 tablet (50 mg total) by mouth daily. 09/23/22  Yes Gaston Islam., NP  metoprolol tartrate (LOPRESSOR) 50 MG tablet Take 1 tablet (50 mg total) by mouth once for 1 dose. Take if needed for procedure Patient not taking: Reported on 11/19/2022 09/24/22 09/24/22  Gaston Islam., NP  omeprazole (PRILOSEC) 40 MG capsule Take 40 mg by mouth as needed. Patient not taking: Reported on 11/19/2022 09/22/22   [provider]  ondansetron (ZOFRAN-ODT) 4 MG disintegrating tablet Take 4 mg by mouth every 8 (eight) hours as needed for vomiting or nausea. Patient not taking: Reported on 10/22/2022 07/07/22   [provider]  OVER THE COUNTER MEDICATION Elderberry syrup with other herbs as needed Patient not taking: Reported on 11/19/2022    [provider]  potassium chloride SA (KLOR-CON M20) 20 MEQ tablet Take 1 tablet by mouth daily. Patient not taking: Reported on 11/19/2022    [provider]  predniSONE (DELTASONE) 50 MG tablet Take 1 tablet 13 hours, 7 hours and 1 hour prior to CT. Patient not taking: Reported on 10/22/2022 09/24/22   Gaston Islam., NP    Current Outpatient Medications  Medication Sig Dispense Refill   carvedilol (COREG) 3.125 MG tablet Take 1 tablet (3.125 mg total) by  mouth 2 (two) times daily. 180 tablet 3   Cholecalciferol (VITAMIN D3) 250 MCG (10000 UT) TABS Take 1 tablet by mouth every other day.     hydrochlorothiazide (HYDRODIURIL) 25 MG tablet Take 1 tablet (25 mg total) by mouth daily. 90 tablet 3   MAGNESIUM PO Take 1 tablet by mouth daily at 6 (six) AM.     Multiple Vitamin (MULTI-VITAMIN) tablet 1 tablet daily.     OVER THE COUNTER MEDICATION Turmeric 1500mg      spironolactone (ALDACTONE) 50 MG tablet Take 1 tablet (50 mg total) by mouth daily. 90 tablet 3   metoprolol tartrate (LOPRESSOR) 50 MG tablet Take 1  tablet (50 mg total) by mouth once for 1 dose. Take if needed for procedure (Patient not taking: Reported on 11/19/2022) 1 tablet 0   omeprazole (PRILOSEC) 40 MG capsule Take 40 mg by mouth as needed. (Patient not taking: Reported on 11/19/2022)     ondansetron (ZOFRAN-ODT) 4 MG disintegrating tablet Take 4 mg by mouth every 8 (eight) hours as needed for vomiting or nausea. (Patient not taking: Reported on 10/22/2022)     OVER THE COUNTER MEDICATION Elderberry syrup with other herbs as needed (Patient not taking: Reported on 11/19/2022)     potassium chloride SA (KLOR-CON M20) 20 MEQ tablet Take 1 tablet by mouth daily. (Patient not taking: Reported on 11/19/2022)     predniSONE (DELTASONE) 50 MG tablet Take 1 tablet 13 hours, 7 hours and 1 hour prior to CT. (Patient not taking: Reported on 10/22/2022) 3 tablet 0   Current Facility-Administered Medications  Medication Dose Route Frequency Provider Last Rate Last Admin   0.9 %  sodium chloride infusion  500 mL Intravenous Continuous Damean Poffenberger, Eleonore Chiquito, MD        Allergies as of 11/19/2022 - Review Complete 11/19/2022  Allergen Reaction Noted   Iodinated contrast media Anaphylaxis 04/12/2012   Dexamethasone  04/12/2012   Iohexol  08/07/2003   Sulfa antibiotics Hives 07/09/2011   Sulfasalazine Hives 07/09/2011    Family History  Problem Relation Age of Onset   Alcohol abuse Mother    Hypertension Mother    High Cholesterol Mother    Kidney disease Mother    Anxiety disorder Mother    Drug abuse Father    Hypertension Father    Emphysema Father    High Cholesterol Father    Anxiety disorder Father    Asthma Brother    Multiple sclerosis Brother    Colon cancer Maternal Uncle    Depression Paternal Aunt    Depression Paternal Uncle    Heart failure Maternal Grandmother    Alcohol abuse Maternal Grandmother    Arthritis Maternal Grandmother    Cancer Maternal Grandmother    Hypertension Maternal Grandmother    Alcohol abuse Maternal  Grandfather    Hypertension Maternal Grandfather    Heart failure Paternal Grandmother    Arthritis Paternal Grandmother    Cancer Paternal Grandmother    Hypertension Paternal Grandmother    Asthma Paternal Grandmother    Hypertension Paternal Grandfather    Asthma Daughter    Asthma Daughter     Social History   Socioeconomic History   Marital status: Divorced    Spouse name: Not on file   Number of children: Not on file   Years of education: Not on file   Highest education level: Bachelor's degree (e.g., BA, AB, BS)  Occupational History   Occupation: RN  Tobacco Use   Smoking status: Never  Smokeless tobacco: Never  Vaping Use   Vaping Use: Never used  Substance and Sexual Activity   Alcohol use: No    Alcohol/week: 0.0 standard drinks of alcohol   Drug use: No   Sexual activity: Not on file  Other Topics Concern   Not on file  Social History Narrative   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, Community education officer      Home Situation: lives with her son and daughter      Spiritual Beliefs: Christian      Lifestyle: Curves, some exercise and trying to work on diet      Social Determinants of Health   Financial Resource Strain: Low Risk  (05/13/2022)   Overall Financial Resource Strain (CARDIA)    Difficulty of Paying Living Expenses: Not very hard  Food Insecurity: No Food Insecurity (05/13/2022)   Hunger Vital Sign    Worried About Running Out of Food in the Last Year: Never true    Ran Out of Food in the Last Year: Never true  Transportation Needs: No Transportation Needs (05/13/2022)   PRAPARE - Administrator, Civil Service (Medical): No    Lack of Transportation (Non-Medical): No  Physical Activity: Unknown (05/13/2022)   Exercise Vital Sign    Days of Exercise per Week: 0 days    Minutes of Exercise per Session: Not on file  Stress: Stress Concern Present (05/13/2022)   [employer] of Occupational Health - Occupational Stress Questionnaire     Feeling of Stress : Very much  Social Connections: Unknown (05/13/2022)   Social Connection and Isolation Panel [NHANES]    Frequency of Communication with Friends and Family: More than three times a week    Frequency of Social Gatherings with Friends and Family: More than three times a week    Attends Religious Services: Patient declined    Database administrator or Organizations: No    Attends Engineer, structural: Not on file    Marital Status: Divorced  Intimate Partner Violence: Not At Risk (01/08/2021)   Humiliation, Afraid, Rape, and Kick questionnaire    Fear of Current or Ex-Partner: No    Emotionally Abused: No    Physically Abused: No    Sexually Abused: No    Review of Systems:  All other review of systems negative except as mentioned in the HPI.  Physical Exam: Vital signs in last 24 hours: Blood Pressure (Abnormal) 148/88   Pulse 77   Temperature (Abnormal) 97.5 F (36.4 C)   Respiration 11   Height 5\' 1"  (1.549 m)   Weight 192 lb (87.1 kg)   Last Menstrual Period 11/05/2022 (Exact Date)   Oxygen Saturation 100%   Body Mass Index 36.28 kg/m  General:   Alert, NAD Lungs:  Clear .   Heart:  Regular rate and rhythm Abdomen:  Soft, nontender and nondistended. Neuro/Psych:  Alert and cooperative. Normal mood and affect. A and O x 3  Reviewed labs, radiology imaging, old records and pertinent past GI work up  Patient is appropriate for planned procedure(s) and anesthesia in an ambulatory setting   K. Scherry Ran , MD [phone]

## 2022-11-20 ENCOUNTER — Telehealth: Payer: Self-pay | Admitting: *Deleted

## 2022-11-20 NOTE — Telephone Encounter (Signed)
Attempted f/u phone call. No answer. Left message. °

## 2022-12-01 NOTE — Progress Notes (Deleted)
Office Visit    Patient Name: Sandra Vang Date of Encounter: 12/01/2022  Primary Care Provider:  Penelope Galas, MD Primary Cardiologist:  Chilton Si, MD Primary Electrophysiologist: None   Past Medical History    Past Medical History:  Diagnosis Date   ADD (attention deficit disorder)    B12 deficiency    Chest pain    Chronic headaches    Depression    Fibromyalgia    Gallbladder disease    Heart murmur    Hypertension    Insomnia    Joint pain    Lactose intolerance    MRSA (methicillin resistant Staphylococcus aureus) infection    OSA (obstructive sleep apnea) 03/24/2015   Psoriatic arthritis (HCC)    Sleep apnea    Systemic lupus erythematosus (HCC) 10/01/2016   -seeing dermatology and rheumatologist (Dr. Kathi Ludwig)   Vitamin D deficiency    Past Surgical History:  Procedure Laterality Date   APPENDECTOMY  2005   CESAREAN SECTION     TUBAL LIGATION  1998    Allergies  Allergies  Allergen Reactions   Iodinated Contrast Media Anaphylaxis   Dexamethasone     Severe vaginal and rectal burning   Iohexol      Desc: HIVES    Sulfa Antibiotics Hives   Sulfasalazine Hives     History of Present Illness    Sandra Vang is a 47 y.o. female with PMH of HTN, systemic lupus erythematosus, fibromyalgia, OSA, psoriatic arthritis, ADD, depression who presents today for 71-month follow-up.  Sandra Vang was initially seen by Dr. Eden Emms on 08/2012 for complaint of chest pressure. Stress echo was completed that was normal. She was seen on 12/2020 by Dr. Duke Salvia for management of hypertension. She was referred by Dr. Cherly Hensen on 09/2020 due to elevated blood pressure of 164/100. She was initially diagnosed with HTN in 2016 during visit patient reported headaches that occurred in the morning and reported history of OSA with noncompliance of CPAP.  She was seen for complaint of dizziness and chest discomfort on 05/21/2022 and underwent ETT that was negative for  ischemia.  She presented for follow-up on 09/23/2022 and blood pressure was well-controlled.  She endorses ongoing fatigue that she reported may be related to her BP medications.  She underwent coronary CTA on 10/17/2022 that showed calcium score of 0 and no evidence of CAD.   Since last being seen in the office patient reports***.  Patient denies chest pain, palpitations, dyspnea, PND, orthopnea, nausea, vomiting, dizziness, syncope, edema, weight gain, or early satiety.    ***Notes: -Patient had allergic reaction with initial injection of contrast media.  Home Medications    Current Outpatient Medications  Medication Sig Dispense Refill   carvedilol (COREG) 3.125 MG tablet Take 1 tablet (3.125 mg total) by mouth 2 (two) times daily. 180 tablet 3   Cholecalciferol (VITAMIN D3) 250 MCG (10000 UT) TABS Take 1 tablet by mouth every other day.     hydrochlorothiazide (HYDRODIURIL) 25 MG tablet Take 1 tablet (25 mg total) by mouth daily. 90 tablet 3   MAGNESIUM PO Take 1 tablet by mouth daily at 6 (six) AM.     metoprolol tartrate (LOPRESSOR) 50 MG tablet Take 1 tablet (50 mg total) by mouth once for 1 dose. Take if needed for procedure (Patient not taking: Reported on 11/19/2022) 1 tablet 0   Multiple Vitamin (MULTI-VITAMIN) tablet 1 tablet daily.     omeprazole (PRILOSEC) 40 MG capsule Take 40 mg by mouth as  needed. (Patient not taking: Reported on 11/19/2022)     ondansetron (ZOFRAN-ODT) 4 MG disintegrating tablet Take 4 mg by mouth every 8 (eight) hours as needed for vomiting or nausea. (Patient not taking: Reported on 10/22/2022)     OVER THE COUNTER MEDICATION Turmeric 1500mg      OVER THE COUNTER MEDICATION Elderberry syrup with other herbs as needed (Patient not taking: Reported on 11/19/2022)     potassium chloride SA (KLOR-CON M20) 20 MEQ tablet Take 1 tablet by mouth daily. (Patient not taking: Reported on 11/19/2022)     predniSONE (DELTASONE) 50 MG tablet Take 1 tablet 13 hours, 7 hours and  1 hour prior to CT. (Patient not taking: Reported on 10/22/2022) 3 tablet 0   spironolactone (ALDACTONE) 50 MG tablet Take 1 tablet (50 mg total) by mouth daily. 90 tablet 3   No current facility-administered medications for this visit.     Review of Systems  Please see the history of present illness.    (+)*** (+)***  All other systems reviewed and are otherwise negative except as noted above.  Physical Exam    Wt Readings from Last 3 Encounters:  11/19/22 192 lb (87.1 kg)  10/22/22 192 lb (87.1 kg)  09/23/22 195 lb 6.4 oz (88.6 kg)   ZO:XWRUE were no vitals filed for this visit.,There is no height or weight on file to calculate BMI.  Constitutional:      Appearance: Healthy appearance. Not in distress.  Neck:     Vascular: JVD normal.  Pulmonary:     Effort: Pulmonary effort is normal.     Breath sounds: No wheezing. No rales. Diminished in the bases Cardiovascular:     Normal rate. Regular rhythm. Normal S1. Normal S2.      Murmurs: There is no murmur.  Edema:    Peripheral edema absent.  Abdominal:     Palpations: Abdomen is soft non tender. There is no hepatomegaly.  Skin:    General: Skin is warm and dry.  Neurological:     General: No focal deficit present.     Mental Status: Alert and oriented to person, place and time.     Cranial Nerves: Cranial nerves are intact.  EKG/LABS/ Recent Cardiac Studies    ECG personally reviewed by me today - ***  Cardiac Studies & Procedures     STRESS TESTS  EXERCISE TOLERANCE TEST (ETT) 06/06/2022  Narrative   No ST deviation was noted.  Negative adequate stress test without evidence of ischemia at given workload.   ECHOCARDIOGRAM  ECHOCARDIOGRAM COMPLETE 12/25/2021  Narrative ECHOCARDIOGRAM REPORT    Patient Name:   Sandra SCHMALZRIED Date of Exam: 12/25/2021 Medical Rec #:  [id number]        Height:       61.0 in Accession #:    [id number]       Weight:       190.0 lb Date of Birth:  March 29, 1976         BSA:           1.848 m Patient Age:    46 years         BP:           165/96 mmHg Patient Gender: F                HR:           75 bpm. Exam Location:  [address]  Procedure: 2D Echo, 3D Echo, Cardiac Doppler, Color Doppler and Strain  Analysis  Indications:    R06.09 Dyspnea on exertion  History:        Patient has no prior history of Echocardiogram examinations. Risk Factors:Hypertension and Sleep Apnea. Obese. Lupus.  Sonographer:    Jorje Guild BS, RDCS Referring Phys: [id number] CAITLIN S WALKER  IMPRESSIONS   1. Left ventricular ejection fraction, by estimation, is 65 to 70%. The left ventricle has normal function. The left ventricle has no regional wall motion abnormalities. There is mild asymmetric left ventricular hypertrophy of the septal segment. Left ventricular diastolic parameters were normal. The average left ventricular global longitudinal strain is -27.4 %. The global longitudinal strain is normal. 2. Right ventricular systolic function is normal. The right ventricular size is normal. There is normal pulmonary artery systolic pressure. The estimated right ventricular systolic pressure is 28.0 mmHg. 3. The mitral valve is normal in structure. Trivial mitral valve regurgitation. No evidence of mitral stenosis. 4. The aortic valve is tricuspid. Aortic valve regurgitation is not visualized. No aortic stenosis is present. 5. The inferior vena cava is normal in size with greater than 50% respiratory variability, suggesting right atrial pressure of 3 mmHg. 6. Increased flow velocities may be secondary to anemia, thyrotoxicosis, hyperdynamic or high flow state.  FINDINGS Left Ventricle: Left ventricular ejection fraction, by estimation, is 65 to 70%. The left ventricle has normal function. The left ventricle has no regional wall motion abnormalities. The average left ventricular global longitudinal strain is -27.4 %. The global longitudinal strain is normal. 3D left ventricular ejection  fraction analysis performed but not reported based on interpreter judgement due to suboptimal tracking. The left ventricular internal cavity size was normal in size. There is mild asymmetric left ventricular hypertrophy of the septal segment. Left ventricular diastolic parameters were normal.  Right Ventricle: The right ventricular size is normal. No increase in right ventricular wall thickness. Right ventricular systolic function is normal. There is normal pulmonary artery systolic pressure. The tricuspid regurgitant velocity is 2.50 m/s, and with an assumed right atrial pressure of 3 mmHg, the estimated right ventricular systolic pressure is 28.0 mmHg.  Left Atrium: Left atrial size was normal in size.  Right Atrium: Right atrial size was normal in size.  Pericardium: There is no evidence of pericardial effusion.  Mitral Valve: The mitral valve is normal in structure. Trivial mitral valve regurgitation. No evidence of mitral valve stenosis.  Tricuspid Valve: The tricuspid valve is normal in structure. Tricuspid valve regurgitation is mild . No evidence of tricuspid stenosis.  Aortic Valve: The aortic valve is tricuspid. Aortic valve regurgitation is not visualized. No aortic stenosis is present.  Pulmonic Valve: The pulmonic valve was normal in structure. Pulmonic valve regurgitation is trivial. No evidence of pulmonic stenosis.  Aorta: The aortic root is normal in size and structure.  Venous: The inferior vena cava is normal in size with greater than 50% respiratory variability, suggesting right atrial pressure of 3 mmHg.  IAS/Shunts: The atrial septum is grossly normal.   LEFT VENTRICLE PLAX 2D LVIDd:         4.00 cm   Diastology LVIDs:         2.50 cm   LV e' medial:    9.14 cm/s LV PW:         1.10 cm   LV E/e' medial:  10.4 LV IVS:        1.20 cm   LV e' lateral:   22.00 cm/s LVOT diam:     2.20 cm   LV  E/e' lateral: 4.3 LV SV:         102 LV SV Index:   55        2D  Longitudinal Strain LVOT Area:     3.80 cm  2D Strain GLS (A2C):   -31.1 % 2D Strain GLS (A3C):   -22.7 % 2D Strain GLS (A4C):   -28.5 % 2D Strain GLS Avg:     -27.4 %  3D Volume EF: 3D EF:        59 % LV EDV:       127 ml LV ESV:       52 ml LV SV:        74 ml  RIGHT VENTRICLE             IVC RV Basal diam:  3.70 cm     IVC diam: 1.30 cm RV S prime:     12.80 cm/s TAPSE (M-mode): 2.3 cm RVSP:           28.0 mmHg  LEFT ATRIUM             Index        RIGHT ATRIUM           Index LA diam:        3.60 cm 1.95 cm/m   RA Pressure: 3.00 mmHg LA Vol (A2C):   48.9 ml 26.46 ml/m  RA Area:     11.60 cm LA Vol (A4C):   52.8 ml 28.57 ml/m  RA Volume:   28.80 ml  15.58 ml/m LA Biplane Vol: 51.3 ml 27.76 ml/m AORTIC VALVE LVOT Vmax:   129.00 cm/s LVOT Vmean:  91.200 cm/s LVOT VTI:    0.268 m  AORTA Ao Root diam: 2.90 cm Ao Asc diam:  2.90 cm  MITRAL VALVE               TRICUSPID VALVE TR Peak grad:   25.0 mmHg MV Decel Time: 144 msec    TR Vmax:        250.00 cm/s MV E velocity: 95.40 cm/s  Estimated RAP:  3.00 mmHg MV A velocity: 57.10 cm/s  RVSP:           28.0 mmHg MV E/A ratio:  1.67 SHUNTS Systemic VTI:  0.27 m Systemic Diam: 2.20 cm  Weston Brass MD Electronically signed by Weston Brass MD Signature Date/Time: 12/25/2021/4:25:57 PM    Final    MONITORS  LONG TERM MONITOR (3-14 DAYS) 10/14/2021  Narrative 4 Day Zio Monitor  Quality: Fair.  Baseline artifact. Predominant rhythm: sinus rhythm Average heart rate: 83 bpm Max heart rate: 137 bpm Min heart rate: 54 bpm Pauses >2.5 seconds: none  Rare PACs and PVCs Ventricular triplet  Tiffany C. Duke Salvia, MD, [facility] 10/14/2021 4:36 PM   CT SCANS  CT CORONARY MORPH W/CTA COR W/SCORE 10/17/2022  Addendum 10/17/2022 11:39 AM ADDENDUM REPORT: 10/17/2022 11:36  EXAM: OVER-READ INTERPRETATION  CT CHEST  The following report is an over-read performed by radiologist Dr. Alcide Clever of [facility] on 10/17/2022. This over-read does not include interpretation of cardiac or coronary anatomy or pathology. The coronary calcium score/coronary CTA interpretation by the cardiologist is attached.  COMPARISON:  None.  FINDINGS: Cardiovascular: There are no significant extracardiac vascular findings. No pulmonary emboli are seen.  Mediastinum/Nodes: There are no enlarged lymph nodes within the visualized mediastinum.Small sliding-type hiatal hernia is noted.  Lungs/Pleura: There is no pleural effusion. The visualized lungs appear clear.  Upper  abdomen: No significant findings in the visualized upper abdomen.  Musculoskeletal/Chest wall: No chest wall mass or suspicious osseous findings within the visualized chest.  IMPRESSION: Sliding-type hiatal hernia.  No other significant extracardiac findings are noted.   Electronically Signed By: Alcide Clever M.D. On: 10/17/2022 11:36  Narrative HISTORY: Chest pain, nonspecific chest pain, dizziness  EXAM: Cardiac/Coronary CT  TECHNIQUE: The patient was scanned on a [employer].  PROTOCOL: A 90 kV prospective scan was triggered in the descending thoracic aorta at 111 HU's. Axial non-contrast 3 mm slices were carried out through the heart. The data set was analyzed on a dedicated work station and scored using the Agatston method. Gantry rotation speed was 250 msecs and collimation was 0.6 mm. Heart rate was optimized medically and sl NTG was given. The 3D data set was reconstructed in 5% intervals of the 35-75 % of the R-R cycle. Systolic and diastolic phases were analyzed on a dedicated work station using MPR, MIP and VRT modes. The patient received OMNIPAQUE IOHEXOL 350 MG/ML SOLN of contrast.  FINDINGS: Coronary calcium score: The patient's coronary artery calcium score is 0, which places the patient in the 0 percentile.  Coronary arteries: Normal coronary origins.  Right dominance.  Right  Coronary Artery: Normal caliber vessel, gives rise to PDA. No significant plaque or stenosis.  Left Main Coronary Artery: Normal caliber vessel. No significant plaque or stenosis.  Left Anterior Descending Coronary Artery: Normal caliber vessel. No significant plaque or stenosis. Gives rise to one large diagonal branch without significant plaque or stenosis.  Left Circumflex Artery: Normal caliber vessel. No significant plaque or stenosis in visualized vessel, but there is a section of proximal LCx that is not well seen due to slab artifact. Gives rise to two small OM branches.  Aorta: Normal size, 29 mm at the mid ascending aorta (level of the PA bifurcation) measured double oblique. No aortic atherosclerosis. No dissection seen in visualized portions of the aorta.  Aortic Valve: No calcifications. Trileaflet.  Other findings:  Normal pulmonary vein drainage into the left atrium.  Normal left atrial appendage without a thrombus.  Normal size of the pulmonary artery.  Normal appearance of the pericardium.  Significant slab/motion artifact.  IMPRESSION: 1. No evidence of CAD, CADRADS = 0.  2. Coronary calcium score of 0. This was 0 percentile for age-, sex-, and race- matched controls.  3. Significant slab/motion artifact.  INTERPRETATION:  CAD-RADS 0: No evidence of CAD (0%). Consider non-atherosclerotic causes of chest pain.  Electronically Signed: By: Jodelle Red M.D. On: 10/14/2022 15:30          Risk Assessment/Calculations:   {Does this patient have ATRIAL FIBRILLATION?:[phone]}        Lab Results  Component Value Date   WBC 8.8 05/14/2022   HGB 12.0 05/14/2022   HCT 37.6 05/14/2022   MCV 85.9 05/14/2022   PLT 430.0 (H) 05/14/2022   Lab Results  Component Value Date   CREATININE 0.84 05/14/2022   BUN 17 05/14/2022   NA 135 05/14/2022   K 3.9 05/14/2022   CL 101 05/14/2022   CO2 28 05/14/2022   Lab Results  Component Value  Date   ALT 11 05/14/2022   AST 18 05/14/2022   ALKPHOS 80 05/14/2022   BILITOT 0.3 05/14/2022   Lab Results  Component Value Date   CHOL 195 05/14/2022   HDL 64.20 05/14/2022   LDLCALC 120 (H) 05/14/2022   TRIG 56.0 05/14/2022   CHOLHDL 3  05/14/2022    Lab Results  Component Value Date   HGBA1C 6.0 05/14/2022     Assessment & Plan    1.  Essential hypertension: -Patient's blood pressure today is well-controlled at 106/64. -Continue spironolactone 50 mg,HCTZ 25 mg daily and Coreg 3.125 mg twice daily -Continue to check blood pressures with a goal of 130/80 or less.   2.  Lower extremity edema: -Today she reports improvement with swelling. -She was euvolemic on examination today.  She was advised to abstain from excess salt and to continue to elevate her extremities and use compression stockings as needed.   3.  History of OSA: -Patient currently using CPAP -She reports some increased fatigue but is compliant with her appliance.   4.  Chest pain/fatigue: -Today patient reports occasional episodes of chest pain and increased fatigue. -Previous ETT was completed showing no evidence of ischemia -She does have complaint of increased fatigue and we will obtain cardiac CTA due to increased risk factors with OSA, hypertension, for further restratification.   5.  Dizziness: -Patient continues to have complaint of dizziness with presyncopal episode that occurred 3 weeks prior. -We will complete 14-day ZIO monitor to evaluate for possible pauses or arrhythmia -She was advised to change positions slowly decrease any rapid motion with her head. -She was also encouraged to hydrate increase physical activity as tolerated.     Disposition: Follow-up with Chilton Si, MD or APP in *** months {Are you ordering a CV Procedure (e.g. stress test, cath, DCCV, TEE, etc)?   Press F2        :[id number]}   Medication Adjustments/Labs and Tests Ordered: Current medicines are reviewed at  length with the patient today.  Concerns regarding medicines are outlined above.   Signed, Napoleon Form, Leodis Rains, NP 12/01/2022, 6:35 PM [facility]

## 2022-12-02 ENCOUNTER — Ambulatory Visit: Payer: No Typology Code available for payment source | Admitting: Nurse Practitioner

## 2022-12-02 ENCOUNTER — Telehealth: Payer: Self-pay | Admitting: Cardiovascular Disease

## 2022-12-02 DIAGNOSIS — G4733 Obstructive sleep apnea (adult) (pediatric): Secondary | ICD-10-CM

## 2022-12-02 DIAGNOSIS — I10 Essential (primary) hypertension: Secondary | ICD-10-CM

## 2022-12-02 DIAGNOSIS — R42 Dizziness and giddiness: Secondary | ICD-10-CM

## 2022-12-02 DIAGNOSIS — R079 Chest pain, unspecified: Secondary | ICD-10-CM

## 2022-12-02 DIAGNOSIS — R600 Localized edema: Secondary | ICD-10-CM

## 2022-12-02 NOTE — Telephone Encounter (Signed)
Patient is requesting call back to discuss if upcoming appt with Robin Searing, Montez Hageman., NP at 9:00 A.M. could be changed to a virtual appt. Requesting call back to confirm.

## 2022-12-02 NOTE — Telephone Encounter (Signed)
Alden Server made aware and is agreeable with appt being rescheduled.

## 2022-12-02 NOTE — Telephone Encounter (Signed)
Patient states she overslept and will not be able to make it in for appt with Robin Searing, NP today at 9:15am.  Rescheduled appt for 6/12 at 8:50am.  Patient verbalized understanding and expressed appreciation for call.

## 2022-12-10 NOTE — Progress Notes (Unsigned)
Office Visit    Patient Name: Sandra Vang Date of Encounter: 12/10/2022  Primary Care Provider:  Penelope Galas, MD Primary Cardiologist:  Chilton Si, MD Primary Electrophysiologist: None   Past Medical History    Past Medical History:  Diagnosis Date   ADD (attention deficit disorder)    B12 deficiency    Chest pain    Chronic headaches    Depression    Fibromyalgia    Gallbladder disease    Heart murmur    Hypertension    Insomnia    Joint pain    Lactose intolerance    MRSA (methicillin resistant Staphylococcus aureus) infection    OSA (obstructive sleep apnea) 03/24/2015   Psoriatic arthritis (HCC)    Sleep apnea    Systemic lupus erythematosus (HCC) 10/01/2016   -seeing dermatology and rheumatologist (Dr. Kathi Ludwig)   Vitamin D deficiency    Past Surgical History:  Procedure Laterality Date   APPENDECTOMY  2005   CESAREAN SECTION     TUBAL LIGATION  1998    Allergies  Allergies  Allergen Reactions   Iodinated Contrast Media Anaphylaxis   Dexamethasone     Severe vaginal and rectal burning   Iohexol      Desc: HIVES    Sulfa Antibiotics Hives   Sulfasalazine Hives     History of Present Illness    Sandra Vang is a 47 y.o. female with PMH of HTN, systemic lupus erythematosus, fibromyalgia, OSA, psoriatic arthritis, ADD, depression who presents today for 66-month follow-up.   Sandra Vang was initially seen by Dr. Eden Emms on 08/2012 for complaint of chest pressure. Stress echo was completed that was normal. She was seen on 12/2020 by Dr. Duke Salvia for management of hypertension. She was referred by Dr. Cherly Hensen on 09/2020 due to elevated blood pressure of 164/100. She was initially diagnosed with HTN in 2016 during visit patient reported headaches that occurred in the morning and reported history of OSA with noncompliance of CPAP.  She was seen for complaint of dizziness and chest discomfort on 05/21/2022 and underwent ETT that was negative for  ischemia.  She presented for follow-up on 09/23/2022 and blood pressure was well-controlled.  She endorses ongoing fatigue that she reported may be related to her BP medications.  She underwent coronary CTA on 10/17/2022 that showed calcium score of 0 and no evidence of CAD. He procedure was aborted after contrast was administered due to allergic reaction.     [name] presents today with her mother for 62-month follow-up.  Since last being seen in the office patient reports she is doing a little better but still experiencing bouts of fatigue and occasional palpitations.  She denies any new cardiac complaints since her previous visit.  Her blood pressure today is well-controlled at 108/68 and heart rate is 77 bpm.  She is tolerating her current medications without any adverse reactions.  She is noted to have a slight weight gain of 8 pounds since her previous visit.  She does note some inactivity due to increased workload at work and lack of time for exercise.  During today's visit we reviewed her results of cardiac CTA and patient had all questions answered to her satisfaction..  Patient denies chest pain, palpitations, dyspnea, PND, orthopnea, nausea, vomiting, dizziness, syncope, edema, weight gain, or early satiety.   Home Medications    Current Outpatient Medications  Medication Sig Dispense Refill   carvedilol (COREG) 3.125 MG tablet Take 1 tablet (3.125 mg total) by mouth  2 (two) times daily. 180 tablet 3   Cholecalciferol (VITAMIN D3) 250 MCG (10000 UT) TABS Take 1 tablet by mouth every other day.     hydrochlorothiazide (HYDRODIURIL) 25 MG tablet Take 1 tablet (25 mg total) by mouth daily. 90 tablet 3   MAGNESIUM PO Take 1 tablet by mouth daily at 6 (six) AM.     metoprolol tartrate (LOPRESSOR) 50 MG tablet Take 1 tablet (50 mg total) by mouth once for 1 dose. Take if needed for procedure (Patient not taking: Reported on 11/19/2022) 1 tablet 0   Multiple Vitamin (MULTI-VITAMIN) tablet 1 tablet  daily.     omeprazole (PRILOSEC) 40 MG capsule Take 40 mg by mouth as needed. (Patient not taking: Reported on 11/19/2022)     ondansetron (ZOFRAN-ODT) 4 MG disintegrating tablet Take 4 mg by mouth every 8 (eight) hours as needed for vomiting or nausea. (Patient not taking: Reported on 10/22/2022)     OVER THE COUNTER MEDICATION Turmeric 1500mg      OVER THE COUNTER MEDICATION Elderberry syrup with other herbs as needed (Patient not taking: Reported on 11/19/2022)     potassium chloride SA (KLOR-CON M20) 20 MEQ tablet Take 1 tablet by mouth daily. (Patient not taking: Reported on 11/19/2022)     predniSONE (DELTASONE) 50 MG tablet Take 1 tablet 13 hours, 7 hours and 1 hour prior to CT. (Patient not taking: Reported on 10/22/2022) 3 tablet 0   spironolactone (ALDACTONE) 50 MG tablet Take 1 tablet (50 mg total) by mouth daily. 90 tablet 3   No current facility-administered medications for this visit.     Review of Systems  Please see the history of present illness.    (+) Fatigue (+) Palpitation  All other systems reviewed and are otherwise negative except as noted above.  Physical Exam    Wt Readings from Last 3 Encounters:  11/19/22 192 lb (87.1 kg)  10/22/22 192 lb (87.1 kg)  09/23/22 195 lb 6.4 oz (88.6 kg)   ZO:XWRUE were no vitals filed for this visit.,There is no height or weight on file to calculate BMI.  Constitutional:      Appearance: Healthy appearance. Not in distress.  Neck:     Vascular: JVD normal.  Pulmonary:     Effort: Pulmonary effort is normal.     Breath sounds: No wheezing. No rales. Diminished in the bases Cardiovascular:     Normal rate. Regular rhythm. Normal S1. Normal S2.      Murmurs: There is no murmur.  Edema:    Peripheral edema absent.  Abdominal:     Palpations: Abdomen is soft non tender. There is no hepatomegaly.  Skin:    General: Skin is warm and dry.  Neurological:     General: No focal deficit present.     Mental Status: Alert and oriented  to person, place and time.     Cranial Nerves: Cranial nerves are intact.  EKG/LABS/ Recent Cardiac Studies    ECG personally reviewed by me today -none completed today  Cardiac Studies & Procedures     STRESS TESTS  EXERCISE TOLERANCE TEST (ETT) 06/06/2022  Narrative   No ST deviation was noted.  Negative adequate stress test without evidence of ischemia at given workload.   ECHOCARDIOGRAM  ECHOCARDIOGRAM COMPLETE 12/25/2021  Narrative ECHOCARDIOGRAM REPORT    Patient Name:   COVE IGLEHEART Date of Exam: 12/25/2021 Medical Rec #:  [id number]        Height:       61.0  in Accession #:    [id number]       Weight:       190.0 lb Date of Birth:  1976/06/02         BSA:          1.848 m Patient Age:    46 years         BP:           165/96 mmHg Patient Gender: F                HR:           75 bpm. Exam Location:  [address]  Procedure: 2D Echo, 3D Echo, Cardiac Doppler, Color Doppler and Strain Analysis  Indications:    R06.09 Dyspnea on exertion  History:        Patient has no prior history of Echocardiogram examinations. Risk Factors:Hypertension and Sleep Apnea. Obese. Lupus.  Sonographer:    Jorje Guild BS, RDCS Referring Phys: [id number] CAITLIN S WALKER  IMPRESSIONS   1. Left ventricular ejection fraction, by estimation, is 65 to 70%. The left ventricle has normal function. The left ventricle has no regional wall motion abnormalities. There is mild asymmetric left ventricular hypertrophy of the septal segment. Left ventricular diastolic parameters were normal. The average left ventricular global longitudinal strain is -27.4 %. The global longitudinal strain is normal. 2. Right ventricular systolic function is normal. The right ventricular size is normal. There is normal pulmonary artery systolic pressure. The estimated right ventricular systolic pressure is 28.0 mmHg. 3. The mitral valve is normal in structure. Trivial mitral valve regurgitation. No evidence of mitral  stenosis. 4. The aortic valve is tricuspid. Aortic valve regurgitation is not visualized. No aortic stenosis is present. 5. The inferior vena cava is normal in size with greater than 50% respiratory variability, suggesting right atrial pressure of 3 mmHg. 6. Increased flow velocities may be secondary to anemia, thyrotoxicosis, hyperdynamic or high flow state.  FINDINGS Left Ventricle: Left ventricular ejection fraction, by estimation, is 65 to 70%. The left ventricle has normal function. The left ventricle has no regional wall motion abnormalities. The average left ventricular global longitudinal strain is -27.4 %. The global longitudinal strain is normal. 3D left ventricular ejection fraction analysis performed but not reported based on interpreter judgement due to suboptimal tracking. The left ventricular internal cavity size was normal in size. There is mild asymmetric left ventricular hypertrophy of the septal segment. Left ventricular diastolic parameters were normal.  Right Ventricle: The right ventricular size is normal. No increase in right ventricular wall thickness. Right ventricular systolic function is normal. There is normal pulmonary artery systolic pressure. The tricuspid regurgitant velocity is 2.50 m/s, and with an assumed right atrial pressure of 3 mmHg, the estimated right ventricular systolic pressure is 28.0 mmHg.  Left Atrium: Left atrial size was normal in size.  Right Atrium: Right atrial size was normal in size.  Pericardium: There is no evidence of pericardial effusion.  Mitral Valve: The mitral valve is normal in structure. Trivial mitral valve regurgitation. No evidence of mitral valve stenosis.  Tricuspid Valve: The tricuspid valve is normal in structure. Tricuspid valve regurgitation is mild . No evidence of tricuspid stenosis.  Aortic Valve: The aortic valve is tricuspid. Aortic valve regurgitation is not visualized. No aortic stenosis is present.  Pulmonic  Valve: The pulmonic valve was normal in structure. Pulmonic valve regurgitation is trivial. No evidence of pulmonic stenosis.  Aorta: The aortic root is normal in size  and structure.  Venous: The inferior vena cava is normal in size with greater than 50% respiratory variability, suggesting right atrial pressure of 3 mmHg.  IAS/Shunts: The atrial septum is grossly normal.   LEFT VENTRICLE PLAX 2D LVIDd:         4.00 cm   Diastology LVIDs:         2.50 cm   LV e' medial:    9.14 cm/s LV PW:         1.10 cm   LV E/e' medial:  10.4 LV IVS:        1.20 cm   LV e' lateral:   22.00 cm/s LVOT diam:     2.20 cm   LV E/e' lateral: 4.3 LV SV:         102 LV SV Index:   55        2D Longitudinal Strain LVOT Area:     3.80 cm  2D Strain GLS (A2C):   -31.1 % 2D Strain GLS (A3C):   -22.7 % 2D Strain GLS (A4C):   -28.5 % 2D Strain GLS Avg:     -27.4 %  3D Volume EF: 3D EF:        59 % LV EDV:       127 ml LV ESV:       52 ml LV SV:        74 ml  RIGHT VENTRICLE             IVC RV Basal diam:  3.70 cm     IVC diam: 1.30 cm RV S prime:     12.80 cm/s TAPSE (M-mode): 2.3 cm RVSP:           28.0 mmHg  LEFT ATRIUM             Index        RIGHT ATRIUM           Index LA diam:        3.60 cm 1.95 cm/m   RA Pressure: 3.00 mmHg LA Vol (A2C):   48.9 ml 26.46 ml/m  RA Area:     11.60 cm LA Vol (A4C):   52.8 ml 28.57 ml/m  RA Volume:   28.80 ml  15.58 ml/m LA Biplane Vol: 51.3 ml 27.76 ml/m AORTIC VALVE LVOT Vmax:   129.00 cm/s LVOT Vmean:  91.200 cm/s LVOT VTI:    0.268 m  AORTA Ao Root diam: 2.90 cm Ao Asc diam:  2.90 cm  MITRAL VALVE               TRICUSPID VALVE TR Peak grad:   25.0 mmHg MV Decel Time: 144 msec    TR Vmax:        250.00 cm/s MV E velocity: 95.40 cm/s  Estimated RAP:  3.00 mmHg MV A velocity: 57.10 cm/s  RVSP:           28.0 mmHg MV E/A ratio:  1.67 SHUNTS Systemic VTI:  0.27 m Systemic Diam: 2.20 cm  Weston Brass MD Electronically signed by Weston Brass MD Signature Date/Time: 12/25/2021/4:25:57 PM    Final    MONITORS  LONG TERM MONITOR (3-14 DAYS) 10/14/2021  Narrative 4 Day Zio Monitor  Quality: Fair.  Baseline artifact. Predominant rhythm: sinus rhythm Average heart rate: 83 bpm Max heart rate: 137 bpm Min heart rate: 54 bpm Pauses >2.5 seconds: none  Rare PACs and PVCs Ventricular triplet  Tiffany C. Duke Salvia, MD, [facility] 10/14/2021  4:36 PM   CT SCANS  CT CORONARY MORPH W/CTA COR W/SCORE 10/17/2022  Addendum 10/17/2022 11:39 AM ADDENDUM REPORT: 10/17/2022 11:36  EXAM: OVER-READ INTERPRETATION  CT CHEST  The following report is an over-read performed by radiologist Dr. Alcide Clever of [facility] on 10/17/2022. This over-read does not include interpretation of cardiac or coronary anatomy or pathology. The coronary calcium score/coronary CTA interpretation by the cardiologist is attached.  COMPARISON:  None.  FINDINGS: Cardiovascular: There are no significant extracardiac vascular findings. No pulmonary emboli are seen.  Mediastinum/Nodes: There are no enlarged lymph nodes within the visualized mediastinum.Small sliding-type hiatal hernia is noted.  Lungs/Pleura: There is no pleural effusion. The visualized lungs appear clear.  Upper abdomen: No significant findings in the visualized upper abdomen.  Musculoskeletal/Chest wall: No chest wall mass or suspicious osseous findings within the visualized chest.  IMPRESSION: Sliding-type hiatal hernia.  No other significant extracardiac findings are noted.   Electronically Signed By: Alcide Clever M.D. On: 10/17/2022 11:36  Narrative HISTORY: Chest pain, nonspecific chest pain, dizziness  EXAM: Cardiac/Coronary CT  TECHNIQUE: The patient was scanned on a [employer].  PROTOCOL: A 90 kV prospective scan was triggered in the descending thoracic aorta at 111 HU's. Axial non-contrast 3 mm slices were carried out through  the heart. The data set was analyzed on a dedicated work station and scored using the Agatston method. Gantry rotation speed was 250 msecs and collimation was 0.6 mm. Heart rate was optimized medically and sl NTG was given. The 3D data set was reconstructed in 5% intervals of the 35-75 % of the R-R cycle. Systolic and diastolic phases were analyzed on a dedicated work station using MPR, MIP and VRT modes. The patient received OMNIPAQUE IOHEXOL 350 MG/ML SOLN of contrast.  FINDINGS: Coronary calcium score: The patient's coronary artery calcium score is 0, which places the patient in the 0 percentile.  Coronary arteries: Normal coronary origins.  Right dominance.  Right Coronary Artery: Normal caliber vessel, gives rise to PDA. No significant plaque or stenosis.  Left Main Coronary Artery: Normal caliber vessel. No significant plaque or stenosis.  Left Anterior Descending Coronary Artery: Normal caliber vessel. No significant plaque or stenosis. Gives rise to one large diagonal branch without significant plaque or stenosis.  Left Circumflex Artery: Normal caliber vessel. No significant plaque or stenosis in visualized vessel, but there is a section of proximal LCx that is not well seen due to slab artifact. Gives rise to two small OM branches.  Aorta: Normal size, 29 mm at the mid ascending aorta (level of the PA bifurcation) measured double oblique. No aortic atherosclerosis. No dissection seen in visualized portions of the aorta.  Aortic Valve: No calcifications. Trileaflet.  Other findings:  Normal pulmonary vein drainage into the left atrium.  Normal left atrial appendage without a thrombus.  Normal size of the pulmonary artery.  Normal appearance of the pericardium.  Significant slab/motion artifact.  IMPRESSION: 1. No evidence of CAD, CADRADS = 0.  2. Coronary calcium score of 0. This was 0 percentile for age-, sex-, and race- matched controls.  3.  Significant slab/motion artifact.  INTERPRETATION:  CAD-RADS 0: No evidence of CAD (0%). Consider non-atherosclerotic causes of chest pain.  Electronically Signed: By: Jodelle Red M.D. On: 10/14/2022 15:30         Lab Results  Component Value Date   WBC 8.8 05/14/2022   HGB 12.0 05/14/2022   HCT 37.6 05/14/2022   MCV 85.9 05/14/2022  PLT 430.0 (H) 05/14/2022   Lab Results  Component Value Date   CREATININE 0.84 05/14/2022   BUN 17 05/14/2022   NA 135 05/14/2022   K 3.9 05/14/2022   CL 101 05/14/2022   CO2 28 05/14/2022   Lab Results  Component Value Date   ALT 11 05/14/2022   AST 18 05/14/2022   ALKPHOS 80 05/14/2022   BILITOT 0.3 05/14/2022   Lab Results  Component Value Date   CHOL 195 05/14/2022   HDL 64.20 05/14/2022   LDLCALC 120 (H) 05/14/2022   TRIG 56.0 05/14/2022   CHOLHDL 3 05/14/2022    Lab Results  Component Value Date   HGBA1C 6.0 05/14/2022     Assessment & Plan    1.  Essential hypertension: -Patient's blood pressure today is well-controlled at 108/68 -Continue spironolactone 50 mg,HCTZ 25 mg daily and Coreg 3.125 mg twice daily -Continue low-sodium heart healthy diet   2.  Lower extremity edema: -Today she reports that swelling still occurs and is worse by the end of the day. -She was encouraged to stand as frequently as she can during her workday to offset swelling and also abstain from excess salt in her diet. -Please elevate extremities when dependent and use compression stockings  3.  History of OSA: -Patient currently using CPAP -She reports some increased fatigue but is compliant with her appliance.   4.  Chest pain/fatigue: -Patient recently completed a coronary CTA that showed calcium score of 0 with no obstructive disease present. -She was noted to have a hiatal hernia and patient was directed to follow-up with PCP regarding referral to GI. -She denies any ongoing chest discomfort at this time. -She continues  to have some fatigue that she feels may be related to her lupus.   5.  Dizziness: -Currently improved and no longer having complaint -Continue current medications as prescribed.  Disposition: Follow-up with Chilton Si, MD or APP in 12 months    Medication Adjustments/Labs and Tests Ordered: Current medicines are reviewed at length with the patient today.  Concerns regarding medicines are outlined above.   Signed, Napoleon Form, Leodis Rains, NP 12/10/2022, 10:06 AM [facility]

## 2022-12-11 ENCOUNTER — Encounter: Payer: Self-pay | Admitting: Nurse Practitioner

## 2022-12-11 ENCOUNTER — Ambulatory Visit: Payer: No Typology Code available for payment source | Attending: Nurse Practitioner | Admitting: Nurse Practitioner

## 2022-12-11 VITALS — BP 108/68 | HR 77 | Ht 61.0 in | Wt 200.2 lb

## 2022-12-11 DIAGNOSIS — R079 Chest pain, unspecified: Secondary | ICD-10-CM

## 2022-12-11 DIAGNOSIS — I10 Essential (primary) hypertension: Secondary | ICD-10-CM | POA: Diagnosis not present

## 2022-12-11 DIAGNOSIS — R600 Localized edema: Secondary | ICD-10-CM

## 2022-12-11 DIAGNOSIS — G4733 Obstructive sleep apnea (adult) (pediatric): Secondary | ICD-10-CM

## 2022-12-11 DIAGNOSIS — R42 Dizziness and giddiness: Secondary | ICD-10-CM

## 2022-12-11 MED ORDER — SPIRONOLACTONE 50 MG PO TABS: 50.0000 mg | ORAL_TABLET | Freq: Every day | ORAL | 3 refills | Status: DC

## 2022-12-11 MED ORDER — HYDROCHLOROTHIAZIDE 25 MG PO TABS: 25.0000 mg | ORAL_TABLET | Freq: Every day | ORAL | 3 refills | Status: DC

## 2022-12-11 MED ORDER — CARVEDILOL 3.125 MG PO TABS: 3.1250 mg | ORAL_TABLET | Freq: Two times a day (BID) | ORAL | 3 refills | Status: DC

## 2022-12-11 NOTE — Patient Instructions (Signed)
Medication Instructions:  Your physician recommends that you continue on your current medications as directed. Please refer to the Current Medication list given to you today. *If you need a refill on your cardiac medications before your next appointment, please call your pharmacy*   Lab Work: None Ordered If you have labs (blood work) drawn today and your tests are completely normal, you will receive your results only by: MyChart Message (if you have MyChart) OR A paper copy in the mail If you have any lab test that is abnormal or we need to change your treatment, we will call you to review the results.   Testing/Procedures: None Ordered   Follow-Up: At [facility], you and your health needs are our priority.  As part of our continuing mission to provide you with exceptional heart care, we have created designated Provider Care Teams.  These Care Teams include your primary Cardiologist (physician) and Advanced Practice Providers (APPs -  Physician Assistants and Nurse Practitioners) who all work together to provide you with the care you need, when you need it.  We recommend signing up for the patient portal called "MyChart".  Sign up information is provided on this After Visit Summary.  MyChart is used to connect with patients for Virtual Visits (Telemedicine).  Patients are able to view lab/test results, encounter notes, upcoming appointments, etc.  Non-urgent messages can be sent to your provider as well.   To learn more about what you can do with MyChart, go to [email or web address].    Your next appointment:   12 month(s)  Provider:   Chilton Si, MD     Other Instructions

## 2022-12-12 ENCOUNTER — Other Ambulatory Visit: Payer: Self-pay | Admitting: Family Medicine

## 2022-12-27 ENCOUNTER — Other Ambulatory Visit: Payer: No Typology Code available for payment source

## 2023-01-20 ENCOUNTER — Other Ambulatory Visit: Payer: No Typology Code available for payment source

## 2023-01-29 ENCOUNTER — Ambulatory Visit
Admission: RE | Admit: 2023-01-29 | Discharge: 2023-01-29 | Disposition: A | Discharge status: Home / Self Care | Payer: No Typology Code available for payment source | Source: Ambulatory Visit | Attending: Family Medicine | Admitting: Family Medicine

## 2023-01-29 DIAGNOSIS — Z0189 Encounter for other specified special examinations: Secondary | ICD-10-CM

## 2023-01-29 DIAGNOSIS — E042 Nontoxic multinodular goiter: Secondary | ICD-10-CM

## 2023-02-05 LAB — HM PAP SMEAR: HPV, high-risk: NEGATIVE

## 2023-04-08 ENCOUNTER — Encounter: Payer: Self-pay | Admitting: Family Medicine

## 2023-04-08 NOTE — Progress Notes (Incomplete)
HPI: [name] is a 47 y.o. female with a PMHx significant for HTN, OSA, discoid lupus erythematosus, fibromyalgia, polyarthralgia, iron deficiency, and prediabetes who is here today for chronic disease management.  Last seen on 05/17/2022  *** Review of Systems See other pertinent positives and negatives in HPI.  Current Outpatient Medications on File Prior to Visit  Medication Sig Dispense Refill   carvedilol (COREG) 3.125 MG tablet Take 1 tablet (3.125 mg total) by mouth 2 (two) times daily. 180 tablet 3   Cholecalciferol (VITAMIN D3) 250 MCG (10000 UT) TABS Take 1 tablet by mouth every other day.     hydrochlorothiazide (HYDRODIURIL) 25 MG tablet Take 1 tablet (25 mg total) by mouth daily. 90 tablet 3   MAGNESIUM PO Take 1 tablet by mouth daily at 6 (six) AM.     Multiple Vitamin (MULTI-VITAMIN) tablet 1 tablet daily.     omeprazole (PRILOSEC) 40 MG capsule Take 40 mg by mouth as needed.     OVER THE COUNTER MEDICATION Turmeric 1500mg      OVER THE COUNTER MEDICATION Elderberry syrup with other herbs as needed     potassium chloride SA (KLOR-CON M20) 20 MEQ tablet Take 1 tablet by mouth as needed.     predniSONE (DELTASONE) 50 MG tablet Take 1 tablet 13 hours, 7 hours and 1 hour prior to CT. 3 tablet 0   spironolactone (ALDACTONE) 50 MG tablet Take 1 tablet (50 mg total) by mouth daily. 90 tablet 3   No current facility-administered medications on file prior to visit.    Past Medical History:  Diagnosis Date   ADD (attention deficit disorder)    B12 deficiency    Chest pain    Chronic headaches    Depression    Fibromyalgia    Gallbladder disease    Heart murmur    Hypertension    Insomnia    Joint pain    Lactose intolerance    MRSA (methicillin resistant Staphylococcus aureus) infection    OSA (obstructive sleep apnea) 03/24/2015   Psoriatic arthritis (HCC)    Sleep apnea    Systemic lupus erythematosus (HCC) 10/01/2016   -seeing dermatology and  rheumatologist (Dr. Kathi Ludwig)   Vitamin D deficiency    Allergies  Allergen Reactions   Iodinated Contrast Media Anaphylaxis   Dexamethasone     Severe vaginal and rectal burning   Iohexol      Desc: HIVES    Sulfa Antibiotics Hives   Sulfasalazine Hives    Social History   Socioeconomic History   Marital status: Divorced    Spouse name: Not on file   Number of children: Not on file   Years of education: Not on file   Highest education level: Bachelor's degree (e.g., BA, AB, BS)  Occupational History   Occupation: Charity fundraiser  Tobacco Use   Smoking status: Never   Smokeless tobacco: Never  Vaping Use   Vaping status: Never Used  Substance and Sexual Activity   Alcohol use: No    Alcohol/week: 0.0 standard drinks of alcohol   Drug use: No   Sexual activity: Not on file  Other Topics Concern   Not on file  Social History Narrative   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, Community education officer      Home Situation: lives with her son and daughter      Spiritual Beliefs: Christian      Lifestyle: Curves, some exercise and trying to work on diet  Social Determinants of Health   Financial Resource Strain: Low Risk  (05/13/2022)   Overall Financial Resource Strain (CARDIA)    Difficulty of Paying Living Expenses: Not very hard  Food Insecurity: No Food Insecurity (05/13/2022)   Hunger Vital Sign    Worried About Running Out of Food in the Last Year: Never true    Ran Out of Food in the Last Year: Never true  Transportation Needs: No Transportation Needs (05/13/2022)   PRAPARE - Administrator, Civil Service (Medical): No    Lack of Transportation (Non-Medical): No  Physical Activity: Unknown (05/13/2022)   Exercise Vital Sign    Days of Exercise per Week: 0 days    Minutes of Exercise per Session: Not on file  Stress: Stress Concern Present (05/13/2022)   [employer] of Occupational Health - Occupational Stress Questionnaire    Feeling of Stress : Very much   Social Connections: Unknown (05/13/2022)   Social Connection and Isolation Panel [NHANES]    Frequency of Communication with Friends and Family: More than three times a week    Frequency of Social Gatherings with Friends and Family: More than three times a week    Attends Religious Services: Patient declined    Database administrator or Organizations: No    Attends Engineer, structural: Not on file    Marital Status: Divorced    There were no vitals filed for this visit. There is no height or weight on file to calculate BMI.  Physical Exam  ASSESSMENT AND PLAN:  [name] was seen today for chronic disease follow up.   No orders of the defined types were placed in this encounter.   No problem-specific Assessment & Plan notes found for this encounter.   No follow-ups on file.  I, Suanne Marker, acting as a scribe for Betty [place], MD., have documented all relevant documentation on the behalf of Betty [place], MD, as directed by  Betty [place], MD while in the presence of Betty [place], MD.   I, Suanne Marker, have reviewed all documentation for this visit. The documentation on 04/08/23 for the exam, diagnosis, procedures, and orders are all accurate and complete.  Betty G. [place], MD  [facility]. [place] office.

## 2023-04-09 ENCOUNTER — Ambulatory Visit: Payer: No Typology Code available for payment source | Admitting: Family Medicine

## 2023-04-14 ENCOUNTER — Ambulatory Visit: Payer: No Typology Code available for payment source | Admitting: Family Medicine

## 2023-04-18 NOTE — Progress Notes (Signed)
ACUTE VISIT No chief complaint on file.  HPI: Sandra Vang is a 47 y.o. female with a PMHx significant for HTN, OSA, sciatica, discoid lupus erythematosus, fibromyalgia, and prediabetes, among others, who is here today to discuss starting Mounjaro.   Last seen on 05/17/2022. HPI  Review of Systems See other pertinent positives and negatives in HPI.  Current Outpatient Medications on File Prior to Visit  Medication Sig Dispense Refill   carvedilol (COREG) 3.125 MG tablet Take 1 tablet (3.125 mg total) by mouth 2 (two) times daily. 180 tablet 3   Cholecalciferol (VITAMIN D3) 250 MCG (10000 UT) TABS Take 1 tablet by mouth every other day.     hydrochlorothiazide (HYDRODIURIL) 25 MG tablet Take 1 tablet (25 mg total) by mouth daily. 90 tablet 3   MAGNESIUM PO Take 1 tablet by mouth daily at 6 (six) AM.     Multiple Vitamin (MULTI-VITAMIN) tablet 1 tablet daily.     omeprazole (PRILOSEC) 40 MG capsule Take 40 mg by mouth as needed.     OVER THE COUNTER MEDICATION Turmeric 1500mg      OVER THE COUNTER MEDICATION Elderberry syrup with other herbs as needed     potassium chloride SA (KLOR-CON M20) 20 MEQ tablet Take 1 tablet by mouth as needed.     predniSONE (DELTASONE) 50 MG tablet Take 1 tablet 13 hours, 7 hours and 1 hour prior to CT. 3 tablet 0   spironolactone (ALDACTONE) 50 MG tablet Take 1 tablet (50 mg total) by mouth daily. 90 tablet 3   No current facility-administered medications on file prior to visit.    Past Medical History:  Diagnosis Date   ADD (attention deficit disorder)    B12 deficiency    Chest pain    Chronic headaches    Depression    Fibromyalgia    Gallbladder disease    Heart murmur    Hypertension    Insomnia    Joint pain    Lactose intolerance    MRSA (methicillin resistant Staphylococcus aureus) infection    OSA (obstructive sleep apnea) 03/24/2015   Psoriatic arthritis (HCC)    Sleep apnea    Systemic lupus erythematosus (HCC)  10/01/2016   -seeing dermatology and rheumatologist (Dr. Kathi Ludwig)   Vitamin D deficiency    Allergies  Allergen Reactions   Iodinated Contrast Media Anaphylaxis   Dexamethasone     Severe vaginal and rectal burning   Iohexol      Desc: HIVES    Sulfa Antibiotics Hives   Sulfasalazine Hives    Social History   Socioeconomic History   Marital status: Divorced    Spouse name: Not on file   Number of children: Not on file   Years of education: Not on file   Highest education level: Bachelor's degree (e.g., BA, AB, BS)  Occupational History   Occupation: Charity fundraiser  Tobacco Use   Smoking status: Never   Smokeless tobacco: Never  Vaping Use   Vaping status: Never Used  Substance and Sexual Activity   Alcohol use: No    Alcohol/week: 0.0 standard drinks of alcohol   Drug use: No   Sexual activity: Not on file  Other Topics Concern   Not on file  Social History Narrative   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, Community education officer      Home Situation: lives with her son and daughter      Spiritual Beliefs: Christian      Lifestyle: Curves, some exercise and trying to  work on diet      Social Determinants of Health   Financial Resource Strain: Low Risk  (05/13/2022)   Overall Financial Resource Strain (CARDIA)    Difficulty of Paying Living Expenses: Not very hard  Food Insecurity: No Food Insecurity (05/13/2022)   Hunger Vital Sign    Worried About Running Out of Food in the Last Year: Never true    Ran Out of Food in the Last Year: Never true  Transportation Needs: No Transportation Needs (05/13/2022)   PRAPARE - Administrator, Civil Service (Medical): No    Lack of Transportation (Non-Medical): No  Physical Activity: Unknown (05/13/2022)   Exercise Vital Sign    Days of Exercise per Week: 0 days    Minutes of Exercise per Session: Not on file  Stress: Stress Concern Present (05/13/2022)   [employer] of Occupational Health - Occupational Stress Questionnaire     Feeling of Stress : Very much  Social Connections: Unknown (05/13/2022)   Social Connection and Isolation Panel [NHANES]    Frequency of Communication with Friends and Family: More than three times a week    Frequency of Social Gatherings with Friends and Family: More than three times a week    Attends Religious Services: Patient declined    Database administrator or Organizations: No    Attends Engineer, structural: Not on file    Marital Status: Divorced    There were no vitals filed for this visit. There is no height or weight on file to calculate BMI.  Physical Exam  ASSESSMENT AND PLAN:  [name] is here today to discuss starting Mounjaro.   There are no diagnoses linked to this encounter.  No follow-ups on file.  I, Suanne Marker, acting as a scribe for Javon Snee [place], MD., have documented all relevant documentation on the behalf of Jaysion Ramseyer [place], MD, as directed by  Linsay Vogt [place], MD while in the presence of Ayme Short [place], MD.   I, Suanne Marker, have reviewed all documentation for this visit. The documentation on 04/18/23 for the exam, diagnosis, procedures, and orders are all accurate and complete.  Vincenta Steffey G. [place], MD  [facility]. [place] office.  Discharge Instructions   None

## 2023-04-21 ENCOUNTER — Ambulatory Visit (INDEPENDENT_AMBULATORY_CARE_PROVIDER_SITE_OTHER): Payer: No Typology Code available for payment source | Admitting: Family Medicine

## 2023-04-21 VITALS — BP 126/70 | HR 91 | Temp 98.7°F | Resp 12 | Ht 61.0 in | Wt 196.4 lb

## 2023-04-21 DIAGNOSIS — I10 Essential (primary) hypertension: Secondary | ICD-10-CM

## 2023-04-21 DIAGNOSIS — E66812 Obesity, class 2: Secondary | ICD-10-CM

## 2023-04-21 DIAGNOSIS — R7303 Prediabetes: Secondary | ICD-10-CM

## 2023-04-21 DIAGNOSIS — E042 Nontoxic multinodular goiter: Secondary | ICD-10-CM | POA: Diagnosis not present

## 2023-04-21 DIAGNOSIS — Z6837 Body mass index (BMI) 37.0-37.9, adult: Secondary | ICD-10-CM

## 2023-04-21 NOTE — Assessment & Plan Note (Addendum)
Last TSH normal at 3.2 in 03/2023. Last thyroid US 01/2023 stable.

## 2023-04-21 NOTE — Assessment & Plan Note (Addendum)
BP adequately controlled. Because "low" BP's, she is taking Spironolactone 50 mg and hydrochlorothiazide 25 mg every other day, recommend taking the former one daily, can be decreased from 50 mg to 25 mg (0.5 tab). Hydrochlorothiazide 25 mg she can continue daily prn for LE edema. She is not taking Carvedilol, prescribed by her cardiologist, she went back to Metoprolol succinate 25 mg daily. Recommend letting her cardiologist know she is back to Metoprolol succinate.  Monitor BP daily am and pm , for 2 weeks and instructed to let me know about readings. Continue low salt diet. Eye exam is current. Follow up with cardiologist in 11/2023, so I can see her annually as far as BP is at goal, < 130/80.

## 2023-04-21 NOTE — Assessment & Plan Note (Signed)
HgA1C stable at 6.0 on 03/19/23. Encouraged consistency with a healthy life style for diabetes prevention.

## 2023-04-21 NOTE — Assessment & Plan Note (Signed)
She understands the benefits of wt loss as well as adverse effects of obesity. Consistency with healthy diet and physical activity encouraged. 24 hour food reviewed and some changes recommended. I do not recommend pharmacologic treatment at this time, took [facility]. She would like to discuss pharmacologic options, so referred to [employer] [facility].

## 2023-04-21 NOTE — Patient Instructions (Addendum)
A few things to remember from today's visit:  Essential hypertension  Multiple thyroid nodules  Prediabetes  Hyperlipidemia, unspecified hyperlipidemia type  Class 2 severe obesity with serious comorbidity and body mass index (BMI) of 38.0 to 38.9 in adult, unspecified obesity type (HCC), Chronic - Plan: Amb Ref to Medical Weight Management  Monitor blood pressure daily in the morning and night for 2 weeks and let me know about readings. Decrease Spironolactone from 50 mg to 25 mg daily. Continue hydrochlorothiazide daily as needed. No changes in Metoprolol. Next appt with cardiologist in 11/2023. As far as you follow with [facility] regularly, I can see you back in a year.  If you need refills for medications you take chronically, please call your pharmacy. Do not use My Chart to request refills or for acute issues that need immediate attention. If you send a my chart message, it may take a few days to be addressed, specially if I am not in the office.  Please be sure medication list is accurate. If a new problem present, please set up appointment sooner than planned today.

## 2023-05-19 ENCOUNTER — Ambulatory Visit: Payer: No Typology Code available for payment source | Admitting: Family Medicine

## 2023-06-17 ENCOUNTER — Ambulatory Visit (INDEPENDENT_AMBULATORY_CARE_PROVIDER_SITE_OTHER): Payer: No Typology Code available for payment source | Admitting: Family Medicine

## 2023-06-17 ENCOUNTER — Encounter: Payer: Self-pay | Admitting: Family Medicine

## 2023-06-17 VITALS — BP 146/86 | HR 75 | Temp 98.1°F | Ht 61.0 in | Wt 195.0 lb

## 2023-06-17 DIAGNOSIS — J029 Acute pharyngitis, unspecified: Secondary | ICD-10-CM

## 2023-06-17 NOTE — Progress Notes (Signed)
Established Patient Office Visit  Subjective   Patient ID: Sandra Vang, female    DOB: 1976-01-26  Age: 47 y.o. MRN: [id number]  Chief Complaint  Patient presents with   Neck Pain   Ear Pain   Sore Throat    Patient complains of sore throat, x4 days     HPI   Sandra Vang is seen with chief complaint of left tonsillar region pain for the past 4 days or so.  Some radiation toward the left ear and left neck.  She has had occasional headaches as well.  Tried Tylenol and ibuprofen without much improvement.  No fever.  She has had strep throat previously but states this feels much different.  No generalized bodyaches.  Some postnasal drip symptoms.  No active GERD symptoms.  Send little bit of laryngitis intermittently.  No known sick contacts.  Past Medical History:  Diagnosis Date   ADD (attention deficit disorder)    B12 deficiency    Chest pain    Chronic headaches    Depression    Fibromyalgia    Gallbladder disease    Heart murmur    Hypertension    Insomnia    Joint pain    Lactose intolerance    MRSA (methicillin resistant Staphylococcus aureus) infection    OSA (obstructive sleep apnea) 03/24/2015   Psoriatic arthritis (HCC)    Sleep apnea    Systemic lupus erythematosus (HCC) 10/01/2016   -seeing dermatology and rheumatologist (Dr. Kathi Ludwig)   Vitamin D deficiency    Past Surgical History:  Procedure Laterality Date   APPENDECTOMY  2005   CESAREAN SECTION     TUBAL LIGATION  1998    reports that she has never smoked. She has never used smokeless tobacco. She reports that she does not drink alcohol and does not use drugs. family history includes Alcohol abuse in her maternal grandfather, maternal grandmother, and mother; Anxiety disorder in her father and mother; Arthritis in her maternal grandmother and paternal grandmother; Asthma in her brother, daughter, daughter, and paternal grandmother; Cancer in her maternal grandmother and paternal grandmother; Colon cancer in  her maternal uncle; Depression in her paternal aunt and paternal uncle; Drug abuse in her father; Emphysema in her father; Heart failure in her maternal grandmother and paternal grandmother; High Cholesterol in her father and mother; Hypertension in her father, maternal grandfather, maternal grandmother, mother, paternal grandfather, and paternal grandmother; Kidney disease in her mother; Multiple sclerosis in her brother. Allergies  Allergen Reactions   Iodinated Contrast Media Anaphylaxis   Dexamethasone     Severe vaginal and rectal burning   Iohexol      Desc: HIVES    Sulfa Antibiotics Hives   Sulfasalazine Hives    Review of Systems  Constitutional:  Negative for chills and fever.  HENT:  Positive for ear pain and sore throat. Negative for sinus pain.   Respiratory:  Negative for cough.   Cardiovascular:  Negative for chest pain.      Objective:     BP (!) 146/86   Pulse 75   Temp 98.1 F (36.7 C) (Oral)   Ht 5\' 1"  (1.549 m)   Wt 195 lb (88.5 kg)   SpO2 100%   BMI 36.84 kg/m  BP Readings from Last 3 Encounters:  06/17/23 (!) 146/86  04/21/23 126/70  12/11/22 108/68   Wt Readings from Last 3 Encounters:  06/17/23 195 lb (88.5 kg)  04/21/23 196 lb 6 oz (89.1 kg)  12/11/22 200 lb 3.2  oz (90.8 kg)      Physical Exam Vitals reviewed.  Constitutional:      General: She is not in acute distress.    Appearance: She is not ill-appearing or toxic-appearing.  HENT:     Head:     Comments: No left TMJ tenderness.    Right Ear: Tympanic membrane normal.     Left Ear: Tympanic membrane normal.     Mouth/Throat:     Comments: Moist and clear.  No posterior pharynx erythema.  No tonsillar asymmetry.  No exudate.  No obvious foreign bodies.  No intraoral ulcers or lesions.  No gum erythema. Left Stensen's duct orifice appears slightly prominent but no erythema.  No palpated stones.  No purulent drainage.  No parotid tenderness Musculoskeletal:     Cervical back: Neck  supple.  Lymphadenopathy:     Cervical: No cervical adenopathy.  Neurological:     Mental Status: She is alert.      No results found for any visits on 06/17/23.    The 10-year ASCVD risk score (Arnett DK, et al., 2019) is: 3.7%    Assessment & Plan:   Left sided oropharyngeal pain tonsillar region.  No exudate or erythema.  No tonsillar asymmetry.  No evidence for peritonsillar cellulitis or abscess.  Low clinical suspicion for strep with no fever and no erythema or exudate.  No obvious foreign body.  Also no neck adenopathy.  No evidence for left ear infection or active TMJ symptoms.  No obvious dental abscess.   -Recommend salt water gargles -Consider over-the-counter Claritin Xyzal, or Zyrtec for postnasal drip symptoms -Consider ENT referral if symptoms not improving over the next week or so  Evelena Peat, MD

## 2023-06-17 NOTE — Patient Instructions (Signed)
Continue with salt water gargles  Let me know if throat pain not clearing in next few days.    Consider OTC anti-histamine such as Claritan, Xyzal, or Zyrtec.

## 2023-06-20 ENCOUNTER — Encounter: Payer: Self-pay | Admitting: Family Medicine

## 2023-06-20 DIAGNOSIS — J029 Acute pharyngitis, unspecified: Secondary | ICD-10-CM

## 2023-07-24 ENCOUNTER — Encounter (HOSPITAL_BASED_OUTPATIENT_CLINIC_OR_DEPARTMENT_OTHER): Payer: Self-pay

## 2023-07-24 ENCOUNTER — Telehealth (HOSPITAL_BASED_OUTPATIENT_CLINIC_OR_DEPARTMENT_OTHER): Payer: Self-pay | Admitting: Cardiovascular Disease

## 2023-07-24 NOTE — Telephone Encounter (Signed)
  Per MyChart scheduling message:   Patient was sent dot phrases for swelling and palpitations  Hi,   Thanks for your response    If swelling, where is the swelling located?  Bilateral legs, ankles feet    How much weight have you gained and in what time span? 1-3 lbs or more in a few days or by end of week  sometimes    Have you gained 2 pounds in a day or 5 pounds in a week? Occasionally but not this week so far   Do you have a log of your daily weights (if so, list)? I don't....but I can.   Do check my wt 3 to 4 days weekly. It ranges from 192-199 lbs week to week without significant changes to my diet   Are you currently taking a fluid pill? Yes, I am on spironolactone and HCTZ.   Are you currently short of breath?  No...only with moderate exertion.   Have you traveled recently in a car or plane for an extended period of time? No   How long have you had palpitations/irregular heart rate/ Afib? Are you having the symptoms now? Palpitations are occasional and maybe 1-3 times weekly    Are you currently experiencing lightheadedness, shortness of breath or chest pain? Not today. However,  I  have had some lightheadedness or occasional changes in chest sensation, but not pain.   Do you have a history of afib (atrial fibrillation) or irregular heart rhythm? No   Have you checked your blood pressure or heart rate? (document readings if available): Yes. It has been 134/87, 150/80, 140/92, 120/82, 125/75, 132/85, 117/77   Are you experiencing any other- fatigue symptoms?  No chest sensation changes today.

## 2023-07-24 NOTE — Telephone Encounter (Signed)
Called and scheduled visit for 07/31/23.   Alver Sorrow, NP

## 2023-07-25 ENCOUNTER — Encounter (HOSPITAL_BASED_OUTPATIENT_CLINIC_OR_DEPARTMENT_OTHER): Payer: Self-pay

## 2023-07-28 ENCOUNTER — Institutional Professional Consult (permissible substitution) (INDEPENDENT_AMBULATORY_CARE_PROVIDER_SITE_OTHER): Payer: No Typology Code available for payment source | Admitting: Otolaryngology

## 2023-07-28 ENCOUNTER — Encounter (INDEPENDENT_AMBULATORY_CARE_PROVIDER_SITE_OTHER): Payer: Self-pay | Admitting: Otolaryngology

## 2023-07-31 ENCOUNTER — Encounter (INDEPENDENT_AMBULATORY_CARE_PROVIDER_SITE_OTHER): Payer: Self-pay

## 2023-07-31 ENCOUNTER — Ambulatory Visit (HOSPITAL_BASED_OUTPATIENT_CLINIC_OR_DEPARTMENT_OTHER): Payer: No Typology Code available for payment source | Admitting: Family

## 2023-08-26 ENCOUNTER — Encounter (INDEPENDENT_AMBULATORY_CARE_PROVIDER_SITE_OTHER): Payer: Self-pay

## 2023-09-10 ENCOUNTER — Institutional Professional Consult (permissible substitution) (INDEPENDENT_AMBULATORY_CARE_PROVIDER_SITE_OTHER): Payer: No Typology Code available for payment source | Admitting: Otolaryngology

## 2023-09-29 NOTE — Progress Notes (Incomplete)
 HPI: Sandra Vang is a 48 y.o. female with a PMHx significant for HTN, OSA, sciatica, discoid lupus erythematosus, fibromyalgia, and prediabetes, among others, who is here today for her routine physical.  Last CPE: 08/17/2021  Exercise: Diet:  Sleep:  Alcohol Use:  Smoking: Vision:  Dental:  Immunization History  Administered Date(s) Administered   Influenza,inj,Quad PF,6+ Mos 03/31/2014, 05/03/2015   Tdap 08/18/2014   Health Maintenance  Topic Date Due   Hepatitis C Screening  Never done   Cervical Cancer Screening (HPV/Pap Cotest)  12/12/2015   INFLUENZA VACCINE  01/30/2023   COVID-19 Vaccine (1 - 2024-25 season) Never done   DTaP/Tdap/Td (2 - Td or Tdap) 08/18/2024   Colonoscopy  11/18/2032   HIV Screening  Completed   HPV VACCINES  Aged Out   Chronic medical problems: ***  Concerns today: ***  Review of Systems  Current Outpatient Medications on File Prior to Visit  Medication Sig Dispense Refill   Cholecalciferol (VITAMIN D3) 250 MCG (10000 UT) TABS Take 1 tablet by mouth every other day.     hydrochlorothiazide (HYDRODIURIL) 25 MG tablet Take 1 tablet (25 mg total) by mouth daily. 90 tablet 3   MAGNESIUM PO Take 1 tablet by mouth daily at 6 (six) AM.     metoprolol succinate (TOPROL-XL) 25 MG 24 hr tablet Take 25 mg by mouth daily.     Multiple Vitamin (MULTI-VITAMIN) tablet 1 tablet daily.     omeprazole (PRILOSEC) 40 MG capsule Take 40 mg by mouth as needed.     OVER THE COUNTER MEDICATION Turmeric 1500mg      OVER THE COUNTER MEDICATION Elderberry syrup with other herbs as needed     potassium chloride SA (KLOR-CON M20) 20 MEQ tablet Take 1 tablet by mouth as needed.     spironolactone (ALDACTONE) 50 MG tablet Take 1 tablet (50 mg total) by mouth daily. 90 tablet 3   No current facility-administered medications on file prior to visit.   Past Medical History:  Diagnosis Date   ADD (attention deficit disorder)    B12 deficiency    Chest pain     Chronic headaches    Depression    Fibromyalgia    Gallbladder disease    Heart murmur    Hypertension    Insomnia    Joint pain    Lactose intolerance    MRSA (methicillin resistant Staphylococcus aureus) infection    OSA (obstructive sleep apnea) 03/24/2015   Psoriatic arthritis (HCC)    Sleep apnea    Systemic lupus erythematosus (HCC) 10/01/2016   -seeing dermatology and rheumatologist (Dr. Kathi Ludwig)   Vitamin D deficiency    Past Surgical History:  Procedure Laterality Date   APPENDECTOMY  2005   CESAREAN SECTION     TUBAL LIGATION  1998   Allergies  Allergen Reactions   Iodinated Contrast Media Anaphylaxis   Dexamethasone     Severe vaginal and rectal burning   Iohexol      Desc: HIVES    Sulfa Antibiotics Hives   Sulfasalazine Hives   Family History  Problem Relation Age of Onset   Alcohol abuse Mother    Hypertension Mother    High Cholesterol Mother    Kidney disease Mother    Anxiety disorder Mother    Drug abuse Father    Hypertension Father    Emphysema Father    High Cholesterol Father    Anxiety disorder Father    Asthma Brother    Multiple sclerosis Brother  Colon cancer Maternal Uncle    Depression Paternal Aunt    Depression Paternal Uncle    Heart failure Maternal Grandmother    Alcohol abuse Maternal Grandmother    Arthritis Maternal Grandmother    Cancer Maternal Grandmother    Hypertension Maternal Grandmother    Alcohol abuse Maternal Grandfather    Hypertension Maternal Grandfather    Heart failure Paternal Grandmother    Arthritis Paternal Grandmother    Cancer Paternal Grandmother    Hypertension Paternal Grandmother    Asthma Paternal Grandmother    Hypertension Paternal Grandfather    Asthma Daughter    Asthma Daughter    Social History   Socioeconomic History   Marital status: Divorced    Spouse name: Not on file   Number of children: Not on file   Years of education: Not on file   Highest education level: Bachelor's  degree (e.g., BA, AB, BS)  Occupational History   Occupation: Charity fundraiser  Tobacco Use   Smoking status: Never   Smokeless tobacco: Never  Vaping Use   Vaping status: Never Used  Substance and Sexual Activity   Alcohol use: No    Alcohol/week: 0.0 standard drinks of alcohol   Drug use: No   Sexual activity: Not on file  Other Topics Concern   Not on file  Social History Narrative   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, Community education officer      Home Situation: lives with her son and daughter      Spiritual Beliefs: Christian      Lifestyle: Curves, some exercise and trying to work on diet      Social Drivers of Health   Financial Resource Strain: Low Risk  (04/21/2023)   Overall Financial Resource Strain (CARDIA)    Difficulty of Paying Living Expenses: Not hard at all  Food Insecurity: No Food Insecurity (04/21/2023)   Hunger Vital Sign    Worried About Running Out of Food in the Last Year: Never true    Ran Out of Food in the Last Year: Never true  Transportation Needs: No Transportation Needs (04/21/2023)   PRAPARE - Administrator, Civil Service (Medical): No    Lack of Transportation (Non-Medical): No  Physical Activity: Sufficiently Active (04/21/2023)   Exercise Vital Sign    Days of Exercise per Week: 7 days    Minutes of Exercise per Session: 30 min  Stress: Stress Concern Present (04/21/2023)   [employer] of Occupational Health - Occupational Stress Questionnaire    Feeling of Stress : To some extent  Social Connections: Socially Isolated (04/21/2023)   Social Connection and Isolation Panel [NHANES]    Frequency of Communication with Friends and Family: More than three times a week    Frequency of Social Gatherings with Friends and Family: Once a week    Attends Religious Services: Never    Database administrator or Organizations: No    Attends Engineer, structural: Not on file    Marital Status: Divorced   There were no vitals filed for this  visit. There is no height or weight on file to calculate BMI.  Wt Readings from Last 3 Encounters:  06/17/23 195 lb (88.5 kg)  04/21/23 196 lb 6 oz (89.1 kg)  12/11/22 200 lb 3.2 oz (90.8 kg)   Physical Exam  ASSESSMENT AND PLAN:  Sandra Vang was here today for her annual physical examination.  No orders of the defined types were placed in this  encounter.   @ASSESSPLAN @  There are no diagnoses linked to this encounter.  No follow-ups on file.  I, Rolla Etienne Wierda, acting as a scribe for Betty [place], MD., have documented all relevant documentation on the behalf of Betty [place], MD, as directed by  Betty [place], MD while in the presence of Betty [place], MD.   I, Betty [place], MD, have reviewed all documentation for this visit. The documentation on 09/29/23 for the exam, diagnosis, procedures, and orders are all accurate and complete.  Betty G. [place], MD  [facility]. [place] office.

## 2023-09-30 ENCOUNTER — Encounter: Payer: No Typology Code available for payment source | Admitting: Family Medicine

## 2023-09-30 DIAGNOSIS — E785 Hyperlipidemia, unspecified: Secondary | ICD-10-CM | POA: Insufficient documentation

## 2023-10-10 ENCOUNTER — Ambulatory Visit (HOSPITAL_BASED_OUTPATIENT_CLINIC_OR_DEPARTMENT_OTHER): Payer: No Typology Code available for payment source | Admitting: Family

## 2023-11-03 ENCOUNTER — Telehealth: Payer: Self-pay

## 2023-11-03 DIAGNOSIS — I10 Essential (primary) hypertension: Secondary | ICD-10-CM

## 2023-11-03 DIAGNOSIS — E785 Hyperlipidemia, unspecified: Secondary | ICD-10-CM

## 2023-11-03 DIAGNOSIS — R7303 Prediabetes: Secondary | ICD-10-CM

## 2023-11-03 NOTE — Telephone Encounter (Signed)
 Spoke to pt, appt scheduled

## 2023-11-03 NOTE — Transitions of Care (Post Inpatient/ED Visit) (Signed)
   11/03/2023  Name: Sandra Vang MRN: [id number] DOB: 04-Aug-1975  Today's TOC FU Call Status: Today's TOC FU Call Status:: Successful TOC FU Call Completed TOC FU Call Complete Date: 11/03/23 Patient's Name and Date of Birth confirmed.  Transition Care Management Follow-up Telephone Call Date of Discharge:  (patient hasn't been in hospital)  Items Reviewed:    Medications Reviewed Today: Medications Reviewed Today   Medications were not reviewed in this encounter     Home Care and Equipment/Supplies:    Functional Questionnaire:    Follow up appointments reviewed:      SIGNATURE Darrall Ellison, LPN [facility] Nurse Health Advisor Direct Dial [phone]

## 2023-11-03 NOTE — Telephone Encounter (Signed)
 Copied from CRM [id number]. Topic: Clinical - Request for Lab/Test Order >> Nov 03, 2023 12:16 PM Sandra Vang wrote: Reason for CRM: Patient is requesting generals labs and A1c and Thyroid  Panel prior to her physical 5/19.

## 2023-11-10 ENCOUNTER — Encounter (HOSPITAL_COMMUNITY): Payer: Self-pay

## 2023-11-10 ENCOUNTER — Other Ambulatory Visit

## 2023-11-17 ENCOUNTER — Encounter: Admitting: Family Medicine

## 2023-11-17 DIAGNOSIS — R7303 Prediabetes: Secondary | ICD-10-CM

## 2023-11-17 DIAGNOSIS — E785 Hyperlipidemia, unspecified: Secondary | ICD-10-CM

## 2023-11-17 DIAGNOSIS — I10 Essential (primary) hypertension: Secondary | ICD-10-CM

## 2023-11-28 ENCOUNTER — Other Ambulatory Visit

## 2023-11-28 ENCOUNTER — Other Ambulatory Visit: Payer: Self-pay | Admitting: Family Medicine

## 2023-11-28 DIAGNOSIS — I10 Essential (primary) hypertension: Secondary | ICD-10-CM

## 2023-11-28 DIAGNOSIS — R7303 Prediabetes: Secondary | ICD-10-CM

## 2023-11-28 DIAGNOSIS — E785 Hyperlipidemia, unspecified: Secondary | ICD-10-CM

## 2023-11-29 ENCOUNTER — Ambulatory Visit: Payer: Self-pay | Admitting: Family Medicine

## 2023-11-29 LAB — COMPREHENSIVE METABOLIC PANEL WITH GFR
ALT: 8 IU/L (ref 0–32)
AST: 14 IU/L (ref 0–40)
Albumin: 4.4 g/dL (ref 3.9–4.9)
Alkaline Phosphatase: 84 IU/L (ref 44–121)
BUN/Creatinine Ratio: 14 (ref 9–23)
BUN: 10 mg/dL (ref 6–24)
Bilirubin Total: 0.3 mg/dL (ref 0.0–1.2)
CO2: 22 mmol/L (ref 20–29)
Calcium: 9.3 mg/dL (ref 8.7–10.2)
Chloride: 99 mmol/L (ref 96–106)
Creatinine, Ser: 0.74 mg/dL (ref 0.57–1.00)
Globulin, Total: 2.4 g/dL (ref 1.5–4.5)
Glucose: 78 mg/dL (ref 70–99)
Potassium: 4.3 mmol/L (ref 3.5–5.2)
Sodium: 135 mmol/L (ref 134–144)
Total Protein: 6.8 g/dL (ref 6.0–8.5)
eGFR: 100 mL/min/{1.73_m2} (ref >59)

## 2023-11-29 LAB — LIPID PANEL
Chol/HDL Ratio: 3.1 ratio (ref 0.0–4.4)
Cholesterol, Total: 187 mg/dL (ref 100–199)
HDL: 60 mg/dL (ref >39)
LDL Chol Calc (NIH): 113 mg/dL — ABNORMAL HIGH (ref 0–99)
Triglycerides: 77 mg/dL (ref 0–149)
VLDL Cholesterol Cal: 14 mg/dL (ref 5–40)

## 2023-11-29 LAB — TSH: TSH: 2.16 u[IU]/mL (ref 0.450–4.500)

## 2023-11-29 LAB — HEMOGLOBIN A1C
Est. average glucose Bld gHb Est-mCnc: 108 mg/dL
Hgb A1c MFr Bld: 5.4 % (ref 4.8–5.6)

## 2023-12-01 ENCOUNTER — Encounter: Admitting: Family Medicine

## 2023-12-02 ENCOUNTER — Encounter: Admitting: Family Medicine

## 2023-12-05 ENCOUNTER — Encounter: Payer: Self-pay | Admitting: Family Medicine

## 2023-12-15 ENCOUNTER — Encounter: Payer: Self-pay | Admitting: Family Medicine

## 2023-12-15 ENCOUNTER — Ambulatory Visit (INDEPENDENT_AMBULATORY_CARE_PROVIDER_SITE_OTHER): Admitting: Family Medicine

## 2023-12-15 VITALS — BP 136/84 | HR 94 | Resp 12 | Ht 61.0 in | Wt 191.5 lb

## 2023-12-15 DIAGNOSIS — Z0000 Encounter for general adult medical examination without abnormal findings: Secondary | ICD-10-CM | POA: Insufficient documentation

## 2023-12-15 DIAGNOSIS — Z6837 Body mass index (BMI) 37.0-37.9, adult: Secondary | ICD-10-CM

## 2023-12-15 DIAGNOSIS — I10 Essential (primary) hypertension: Secondary | ICD-10-CM | POA: Diagnosis not present

## 2023-12-15 DIAGNOSIS — E66812 Obesity, class 2: Secondary | ICD-10-CM | POA: Diagnosis not present

## 2023-12-15 DIAGNOSIS — Z1159 Encounter for screening for other viral diseases: Secondary | ICD-10-CM

## 2023-12-15 DIAGNOSIS — E042 Nontoxic multinodular goiter: Secondary | ICD-10-CM | POA: Diagnosis not present

## 2023-12-15 DIAGNOSIS — D75839 Thrombocytosis, unspecified: Secondary | ICD-10-CM

## 2023-12-15 NOTE — Assessment & Plan Note (Signed)
We discussed the importance of regular physical activity and healthy diet for prevention of chronic illness and/or complications. Preventive guidelines reviewed. Vaccination up-to-date. Continue her female preventive care with gynecologist. Next CPE in a year. 

## 2023-12-15 NOTE — Patient Instructions (Addendum)
 A few things to remember from today's visit:  Routine general medical examination at a health care facility  Encounter for HCV screening test for low risk patient - Plan: Hepatitis C antibody  Essential hypertension - Plan: CBC  If you need refills for medications you take chronically, please call your pharmacy. Do not use My Chart to request refills or for acute issues that need immediate attention. If you send a my chart message, it may take a few days to be addressed, specially if I am not in the office.  Please be sure medication list is accurate. If a new problem present, please set up appointment sooner than planned today.  Health Maintenance, Female Adopting a healthy lifestyle and getting preventive care are important in promoting health and wellness. Ask your health care provider about: The right schedule for you to have regular tests and exams. Things you can do on your own to prevent diseases and keep yourself healthy. What should I know about diet, weight, and exercise? Eat a healthy diet  Eat a diet that includes plenty of vegetables, fruits, low-fat dairy products, and lean protein. Do not eat a lot of foods that are high in solid fats, added sugars, or sodium. Maintain a healthy weight Body mass index (BMI) is used to identify weight problems. It estimates body fat based on height and weight. Your health care provider can help determine your BMI and help you achieve or maintain a healthy weight. Get regular exercise Get regular exercise. This is one of the most important things you can do for your health. Most adults should: Exercise for at least 150 minutes each week. The exercise should increase your heart rate and make you sweat (moderate-intensity exercise). Do strengthening exercises at least twice a week. This is in addition to the moderate-intensity exercise. Spend less time sitting. Even light physical activity can be beneficial. Watch cholesterol and blood  lipids Have your blood tested for lipids and cholesterol at 48 years of age, then have this test every 5 years. Have your cholesterol levels checked more often if: Your lipid or cholesterol levels are high. You are older than 47 years of age. You are at high risk for heart disease. What should I know about cancer screening? Depending on your health history and family history, you may need to have cancer screening at various ages. This may include screening for: Breast cancer. Cervical cancer. Colorectal cancer. Skin cancer. Lung cancer. What should I know about heart disease, diabetes, and high blood pressure? Blood pressure and heart disease High blood pressure causes heart disease and increases the risk of stroke. This is more likely to develop in people who have high blood pressure readings or are overweight. Have your blood pressure checked: Every 3-5 years if you are 47-48 years of age. Every year if you are 88 years old or older. Diabetes Have regular diabetes screenings. This checks your fasting blood sugar level. Have the screening done: Once every three years after age 53 if you are at a normal weight and have a low risk for diabetes. More often and at a younger age if you are overweight or have a high risk for diabetes. What should I know about preventing infection? Hepatitis B If you have a higher risk for hepatitis B, you should be screened for this virus. Talk with your health care provider to find out if you are at risk for hepatitis B infection. Hepatitis C Testing is recommended for: Everyone born from [birth year] through 1965.  Anyone with known risk factors for hepatitis C. Sexually transmitted infections (STIs) Get screened for STIs, including gonorrhea and chlamydia, if: You are sexually active and are younger than 48 years of age. You are older than 48 years of age and your health care provider tells you that you are at risk for this type of infection. Your sexual  activity has changed since you were last screened, and you are at increased risk for chlamydia or gonorrhea. Ask your health care provider if you are at risk. Ask your health care provider about whether you are at high risk for HIV. Your health care provider may recommend a prescription medicine to help prevent HIV infection. If you choose to take medicine to prevent HIV, you should first get tested for HIV. You should then be tested every 3 months for as long as you are taking the medicine. Pregnancy If you are about to stop having your period (premenopausal) and you may become pregnant, seek counseling before you get pregnant. Take 400 to 800 micrograms (mcg) of folic acid every day if you become pregnant. Ask for birth control (contraception) if you want to prevent pregnancy. Osteoporosis and menopause Osteoporosis is a disease in which the bones lose minerals and strength with aging. This can result in bone fractures. If you are 104 years old or older, or if you are at risk for osteoporosis and fractures, ask your health care provider if you should: Be screened for bone loss. Take a calcium or vitamin D supplement to lower your risk of fractures. Be given hormone replacement therapy (HRT) to treat symptoms of menopause. Follow these instructions at home: Alcohol use Do not drink alcohol if: Your health care provider tells you not to drink. You are pregnant, may be pregnant, or are planning to become pregnant. If you drink alcohol: Limit how much you have to: 0-1 drink a day. Know how much alcohol is in your drink. In the [place] one drink equals one 12 oz bottle of beer (355 mL), one 5 oz glass of wine (148 mL), or one 1 oz glass of hard liquor (44 mL). Lifestyle Do not use any products that contain nicotine or tobacco. These products include cigarettes, chewing tobacco, and vaping devices, such as e-cigarettes. If you need help quitting, ask your health care provider. Do not use street  drugs. Do not share needles. Ask your health care provider for help if you need support or information about quitting drugs. General instructions Schedule regular health, dental, and eye exams. Stay current with your vaccines. Tell your health care provider if: You often feel depressed. You have ever been abused or do not feel safe at home. Summary Adopting a healthy lifestyle and getting preventive care are important in promoting health and wellness. Follow your health care provider's instructions about healthy diet, exercising, and getting tested or screened for diseases. Follow your health care provider's instructions on monitoring your cholesterol and blood pressure. This information is not intended to replace advice given to you by your health care provider. Make sure you discuss any questions you have with your health care provider. Document Revised: 11/06/2020 Document Reviewed: 11/06/2020 Elsevier Patient Education  2024 [employer].

## 2023-12-15 NOTE — Assessment & Plan Note (Signed)
 BP today mildly elevated. Currently on carvedilol  3.125 mg twice daily. She takes spironolactone  a couple times per week because lower BP's causes fatigue. She is not on HCTZ. Continue monitoring BP regularly. She follows with cardiologist.

## 2023-12-15 NOTE — Progress Notes (Unsigned)
 HPI: Sandra Vang is a 48 y.o. female with a PMHx significant for HTN, OSA, sciatica, discoid lupus erythematosus, fibromyalgia, and prediabetes, among some, who is here today for her routine physical.  Last CPE: 08/17/2021  Exercise: Patient states she has not been exercising lately due to her work schedule and added responsibility caring for her grandson. She is planning to start doing two 15 minute sessions per week.  Diet: She is cooking at home most of the time but does eat [employer] somewhat regularly. She is trying to improve her diet in general.  Sleep: 7-8 hours per night during the week. When she has her grandson during the weekends, her sleep is frequently interrupted.  Alcohol: none Smoking: never Vision: UTD on routine vision care.  Dental: She missed her appointment a few months ago but is planning to reschedule.   Immunization History  Administered Date(s) Administered   Influenza,inj,Quad PF,6+ Mos 03/31/2014, 05/03/2015   Tdap 08/18/2014   Health Maintenance  Topic Date Due   Hepatitis C Screening  Never done   Cervical Cancer Screening (HPV/Pap Cotest)  12/12/2015   COVID-19 Vaccine (1 - 2024-25 season) 12/31/2023 (Originally 03/02/2023)   INFLUENZA VACCINE  01/30/2024   DTaP/Tdap/Td (2 - Td or Tdap) 08/18/2024   Colonoscopy  11/18/2032   HIV Screening  Completed   HPV VACCINES  Aged Out   Meningococcal B Vaccine  Aged Out   Chronic medical problems:   Hypertension:  Medications: Currently on hydrochlorothiazide  25 mg daily, carvedilol  3.125 mg twice daily, and spironolactone  50 mg. No longer taking metoprolol  tartrate. She mentions she does not always take the spironolactone  (about 3 times per week) and only takes hydrochlorothiazide  if her BP is high.  Checking her BP at home: she checks her BP regularly at home and says it is usually in the 120s-130s/80s or 90s diastolic if she is stressed.  Side effects: She states she feels tired when her BP is around  110/60s.  Negative for unusual or severe headache, visual changes, exertional chest pain, dyspnea,  focal weakness, or edema.  Lab Results  Component Value Date   CREATININE 0.74 11/28/2023   BUN 10 11/28/2023   NA 135 11/28/2023   K 4.3 11/28/2023   CL 99 11/28/2023   CO2 22 11/28/2023   Lupus:  She has not been following with rheumatology lately, but has one if she needs to go.   GERD:  She is not taking omeprazole  40 mg. Not having symptoms at this time.   Also taking magnesium glycinate (200 mg) with passion flower, valerian, and L-threonine, a vitamin B complex with vitamin C, and vitamin D (10000 twice per week) supplementation. No longer taking a multivitamin.   Concerns today:   She asks about GLP-1s for weight loss.   Review of Systems  Current Outpatient Medications on File Prior to Visit  Medication Sig Dispense Refill   carvedilol  (COREG ) 3.125 MG tablet Take 3.125 mg by mouth 2 (two) times daily with a meal.     Cholecalciferol (VITAMIN D3) 250 MCG (10000 UT) TABS Take 1 tablet by mouth every other day.     hydrochlorothiazide  (HYDRODIURIL ) 25 MG tablet Take 1 tablet (25 mg total) by mouth daily. 90 tablet 3   MAGNESIUM GLYCINATE PO Take 300 mg by mouth 2 times daily at 12 noon and 4 pm.     potassium chloride  SA (KLOR-CON  M20) 20 MEQ tablet Take 1 tablet by mouth as needed.     spironolactone  (ALDACTONE ) 50  MG tablet Take 1 tablet (50 mg total) by mouth daily. 90 tablet 3   No current facility-administered medications on file prior to visit.   Past Medical History:  Diagnosis Date   ADD (attention deficit disorder)    B12 deficiency    Chest pain    Chronic headaches    Depression    Fibromyalgia    Gallbladder disease    Heart murmur    Hypertension    Insomnia    Joint pain    Lactose intolerance    MRSA (methicillin resistant Staphylococcus aureus) infection    OSA (obstructive sleep apnea) 03/24/2015   Psoriatic arthritis (HCC)    Sleep apnea     Systemic lupus erythematosus (HCC) 10/01/2016   -seeing dermatology and rheumatologist (Dr. Henrine Logan)   Vitamin D deficiency    Past Surgical History:  Procedure Laterality Date   APPENDECTOMY  2005   CESAREAN SECTION     TUBAL LIGATION  1998   Allergies  Allergen Reactions   Iodinated Contrast Media Anaphylaxis   Dexamethasone     Severe vaginal and rectal burning   Iohexol       Desc: HIVES    Sulfa Antibiotics Hives   Sulfasalazine Hives   Family History  Problem Relation Age of Onset   Alcohol abuse Mother    Hypertension Mother    High Cholesterol Mother    Kidney disease Mother    Anxiety disorder Mother    Drug abuse Father    Hypertension Father    Emphysema Father    High Cholesterol Father    Anxiety disorder Father    Asthma Brother    Multiple sclerosis Brother    Colon cancer Maternal Uncle    Depression Paternal Aunt    Depression Paternal Uncle    Heart failure Maternal Grandmother    Alcohol abuse Maternal Grandmother    Arthritis Maternal Grandmother    Cancer Maternal Grandmother    Hypertension Maternal Grandmother    Alcohol abuse Maternal Grandfather    Hypertension Maternal Grandfather    Heart failure Paternal Grandmother    Arthritis Paternal Grandmother    Cancer Paternal Grandmother    Hypertension Paternal Grandmother    Asthma Paternal Grandmother    Hypertension Paternal Grandfather    Asthma Daughter    Asthma Daughter    Social History   Socioeconomic History   Marital status: Divorced    Spouse name: Not on file   Number of children: Not on file   Years of education: Not on file   Highest education level: Bachelor's degree (e.g., BA, AB, BS)  Occupational History   Occupation: Charity fundraiser  Tobacco Use   Smoking status: Never   Smokeless tobacco: Never  Vaping Use   Vaping status: Never Used  Substance and Sexual Activity   Alcohol use: No    Alcohol/week: 0.0 standard drinks of alcohol   Drug use: No   Sexual activity: Not  on file  Other Topics Concern   Not on file  Social History Narrative   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, Community education officer      Home Situation: lives with her son and daughter      Spiritual Beliefs: Christian      Lifestyle: Curves, some exercise and trying to work on diet      Social Drivers of Health   Financial Resource Strain: Low Risk  (04/21/2023)   Overall Financial Resource Strain (CARDIA)    Difficulty of Paying Living Expenses:  Not hard at all  Food Insecurity: No Food Insecurity (04/21/2023)   Hunger Vital Sign    Worried About Running Out of Food in the Last Year: Never true    Ran Out of Food in the Last Year: Never true  Transportation Needs: No Transportation Needs (04/21/2023)   PRAPARE - Administrator, Civil Service (Medical): No    Lack of Transportation (Non-Medical): No  Physical Activity: Sufficiently Active (04/21/2023)   Exercise Vital Sign    Days of Exercise per Week: 7 days    Minutes of Exercise per Session: 30 min  Stress: Stress Concern Present (04/21/2023)   [employer] of Occupational Health - Occupational Stress Questionnaire    Feeling of Stress : To some extent  Social Connections: Socially Isolated (04/21/2023)   Social Connection and Isolation Panel    Frequency of Communication with Friends and Family: More than three times a week    Frequency of Social Gatherings with Friends and Family: Once a week    Attends Religious Services: Never    Database administrator or Organizations: No    Attends Banker Meetings: Not on file    Marital Status: Divorced   Vitals:   12/15/23 1535  BP: 136/84  Pulse: 94  Resp: 12  SpO2: 98%   Body mass index is 36.18 kg/m.  Wt Readings from Last 3 Encounters:  12/15/23 191 lb 8 oz (86.9 kg)  06/17/23 195 lb (88.5 kg)  04/21/23 196 lb 6 oz (89.1 kg)   Physical Exam Vitals and nursing note reviewed.  Constitutional:      General: She is not in acute distress.     Appearance: She is well-developed.  HENT:     Head: Normocephalic and atraumatic.     Right Ear: Tympanic membrane, ear canal and external ear normal.     Left Ear: Tympanic membrane, ear canal and external ear normal.     Mouth/Throat:     Mouth: Mucous membranes are moist.     Pharynx: Oropharynx is clear. Uvula midline.   Eyes:     Extraocular Movements: Extraocular movements intact.     Conjunctiva/sclera: Conjunctivae normal.     Pupils: Pupils are equal, round, and reactive to light.   Neck:     Thyroid : Thyromegaly present. No thyroid  mass.     Trachea: No tracheal deviation.     Comments: *** Cardiovascular:     Rate and Rhythm: Normal rate and regular rhythm.     Pulses:          Dorsalis pedis pulses are 2+ on the right side and 2+ on the left side.     Heart sounds: No murmur heard. Pulmonary:     Effort: Pulmonary effort is normal. No respiratory distress.     Breath sounds: Normal breath sounds.  Abdominal:     Palpations: Abdomen is soft. There is no hepatomegaly or mass.     Tenderness: There is no abdominal tenderness.  Genitourinary:    Comments: Deferred to gyn.  Musculoskeletal:     Right lower leg: No edema.     Left lower leg: No edema.     Comments: No major deformity or signs of synovitis appreciated.  Lymphadenopathy:     Cervical: No cervical adenopathy.     Upper Body:     Right upper body: No supraclavicular adenopathy.     Left upper body: No supraclavicular adenopathy.   Skin:    General: Skin  is warm.     Findings: No erythema or rash.   Neurological:     General: No focal deficit present.     Mental Status: She is alert and oriented to person, place, and time.     Cranial Nerves: No cranial nerve deficit.     Gait: Gait normal.     Deep Tendon Reflexes:     Reflex Scores:      Bicep reflexes are 2+ on the right side and 2+ on the left side.      Patellar reflexes are 2+ on the right side and 2+ on the left side.  Psychiatric:         Mood and Affect: Mood and affect normal.   ASSESSMENT AND PLAN:  Ms. Ashaunti Treptow was here today for her annual physical examination.  Orders Placed This Encounter  Procedures   CBC   Hepatitis C antibody    Routine general medical examination at a health care facility Assessment & Plan: We discussed the importance of regular physical activity and healthy diet for prevention of chronic illness and/or complications. Preventive guidelines reviewed. Vaccination up-to-date. Continue her female preventive care with gynecologist. Next CPE in a year.   Encounter for HCV screening test for low risk patient -     Hepatitis C antibody; Future  Essential hypertension Assessment & Plan: BP today mildly elevated. Currently on carvedilol  3.125 mg twice daily. She takes spironolactone  a couple times per week because lower BP's causes fatigue. She is not on HCTZ. Continue monitoring BP regularly. She follows with cardiologist.  Orders: -     CBC; Future  Return in about 1 year (around 12/14/2024) for CPE, chronic problems.  I, Fritz Jewel Wierda, acting as a scribe for Vito Beg [place], MD., have documented all relevant documentation on the behalf of Lorraine Terriquez [place], MD, as directed by  Marieli Rudy [place], MD while in the presence of Rashan Rounsaville [place], MD.   I, Jaishon Krisher [place], MD, have reviewed all documentation for this visit. The documentation on 12/15/23 for the exam, diagnosis, procedures, and orders are all accurate and complete.  Jeshawn Melucci G. [place], MD  [facility]. [place] office.

## 2023-12-16 ENCOUNTER — Encounter: Payer: Self-pay | Admitting: Family Medicine

## 2023-12-16 LAB — CBC
Hematocrit: 44 % (ref 34.0–46.6)
Hemoglobin: 13.4 g/dL (ref 11.1–15.9)
MCH: 26.7 pg (ref 26.6–33.0)
MCHC: 30.5 g/dL — ABNORMAL LOW (ref 31.5–35.7)
MCV: 88 fL (ref 79–97)
Platelets: 482 10*3/uL — ABNORMAL HIGH (ref 150–450)
RBC: 5.02 x10E6/uL (ref 3.77–5.28)
RDW: 14.5 % (ref 11.7–15.4)
WBC: 8.8 10*3/uL (ref 3.4–10.8)

## 2023-12-16 LAB — HEPATITIS C ANTIBODY: Hep C Virus Ab: NONREACTIVE

## 2023-12-16 NOTE — Assessment & Plan Note (Addendum)
 She understands the benefits of wt loss as well as adverse effects of obesity. Consistency with healthy diet and physical activity encouraged. Making positive life style changes, small steps at the time, will provide long lasting results with minimal risk for adverse effects; so I do not recommend pharmacologic treatment. She has seen provider at [facility], so prefers to hold on referral to healthy wt and wellness clinic.

## 2023-12-16 NOTE — Assessment & Plan Note (Signed)
 Last thyroid  US  in 01/2023 showed similar benign-appearing thyroid  nodules,no suspicious lesions or cervical lymphadenopathy. No further imaging is needed at this time. Last TSH 2.1. Will plan on checking TSH annually.

## 2023-12-17 ENCOUNTER — Ambulatory Visit: Payer: Self-pay | Admitting: Family Medicine

## 2023-12-29 ENCOUNTER — Other Ambulatory Visit (HOSPITAL_BASED_OUTPATIENT_CLINIC_OR_DEPARTMENT_OTHER): Payer: Self-pay | Admitting: Family

## 2023-12-30 ENCOUNTER — Encounter: Payer: Self-pay | Admitting: Family Medicine

## 2023-12-30 ENCOUNTER — Encounter (HOSPITAL_BASED_OUTPATIENT_CLINIC_OR_DEPARTMENT_OTHER): Payer: Self-pay | Admitting: Cardiovascular Disease

## 2023-12-30 DIAGNOSIS — I10 Essential (primary) hypertension: Secondary | ICD-10-CM

## 2023-12-30 MED ORDER — SPIRONOLACTONE 50 MG PO TABS: 50.0000 mg | ORAL_TABLET | Freq: Every day | ORAL | 0 refills | Status: DC

## 2023-12-30 MED ORDER — CARVEDILOL 3.125 MG PO TABS
3.1250 mg | ORAL_TABLET | Freq: Two times a day (BID) | ORAL | 0 refills | Status: DC
Start: 2023-12-30 — End: 2024-01-13

## 2024-01-13 ENCOUNTER — Ambulatory Visit (HOSPITAL_BASED_OUTPATIENT_CLINIC_OR_DEPARTMENT_OTHER): Admitting: Family

## 2024-01-13 VITALS — BP 134/82 | HR 79 | Ht 61.0 in | Wt 191.0 lb

## 2024-01-13 DIAGNOSIS — E66812 Obesity, class 2: Secondary | ICD-10-CM | POA: Diagnosis not present

## 2024-01-13 DIAGNOSIS — Z6836 Body mass index (BMI) 36.0-36.9, adult: Secondary | ICD-10-CM

## 2024-01-13 DIAGNOSIS — R7303 Prediabetes: Secondary | ICD-10-CM

## 2024-01-13 DIAGNOSIS — G4733 Obstructive sleep apnea (adult) (pediatric): Secondary | ICD-10-CM

## 2024-01-13 DIAGNOSIS — E876 Hypokalemia: Secondary | ICD-10-CM | POA: Diagnosis not present

## 2024-01-13 DIAGNOSIS — I10 Essential (primary) hypertension: Secondary | ICD-10-CM

## 2024-01-13 MED ORDER — SEMAGLUTIDE-WEIGHT MANAGEMENT 1.7 MG/0.75ML ~~LOC~~ SOAJ
1.7000 mg | SUBCUTANEOUS | 0 refills | Status: DC
Start: 2024-04-09 — End: 2024-04-19

## 2024-01-13 MED ORDER — SEMAGLUTIDE-WEIGHT MANAGEMENT 0.5 MG/0.5ML ~~LOC~~ SOAJ
0.5000 mg | SUBCUTANEOUS | 0 refills | Status: AC
Start: 2024-02-11 — End: 2024-03-10

## 2024-01-13 MED ORDER — SEMAGLUTIDE-WEIGHT MANAGEMENT 0.25 MG/0.5ML ~~LOC~~ SOAJ
0.2500 mg | SUBCUTANEOUS | 0 refills | Status: AC
Start: 2024-01-13 — End: 2024-02-10

## 2024-01-13 MED ORDER — CARVEDILOL 6.25 MG PO TABS: 6.2500 mg | ORAL_TABLET | Freq: Two times a day (BID) | ORAL | 1 refills | Status: DC

## 2024-01-13 MED ORDER — SEMAGLUTIDE-WEIGHT MANAGEMENT 1 MG/0.5ML ~~LOC~~ SOAJ
1.0000 mg | SUBCUTANEOUS | 0 refills | Status: AC
Start: 2024-03-11 — End: 2024-04-08

## 2024-01-13 MED ORDER — SEMAGLUTIDE-WEIGHT MANAGEMENT 2.4 MG/0.75ML ~~LOC~~ SOAJ
2.4000 mg | SUBCUTANEOUS | 0 refills | Status: AC
Start: 2024-05-08 — End: 2024-06-05

## 2024-01-13 NOTE — Patient Instructions (Addendum)
 Medication Instructions:   STOP Hydrochlorothiazide   CHANGE Carvedilol  6.125mg  BID  You have been prescribed a GLP-1 today called Wegovy .  This is a once weekly injection that helps you lose weight and maintain weight loss.  This medication works best when combined with healthy diet and regular exercise.  GLP1 medications help you lose weight by reducing appetite and helping you feel fuller longer. Most common side effects include nausea, vomiting, diarrhea. These side effects can be limited by eating slowly and eating small meals and often improve after a few days. We have Sent a prescription and are awaiting prior authorization from insurance.   *If you need a refill on your cardiac medications before your next appointment, please call your pharmacy*  Lab Work: Your physician recommends that you return for lab work in 1-2 weeks for BMET If you have labs (blood work) drawn today and your tests are completely normal, you will receive your results only by: MyChart Message (if you have MyChart) OR A paper copy in the mail If you have any lab test that is abnormal or we need to change your treatment, we will call you to review the results.  Testing/Procedures: Your EKG today shows normal sinus rhythm which is a good result  Follow-Up: At [facility], you and your health needs are our priority.  As part of our continuing mission to provide you with exceptional heart care, our providers are all part of one team.  This team includes your primary Cardiologist (physician) and Advanced Practice Providers or APPs (Physician Assistants and Nurse Practitioners) who all work together to provide you with the care you need, when you need it.  Your next appointment:   4-6 month(s)  Provider:   Annabella Scarce, MD, Rosaline Bane, NP, Reche Finder, NP, or Jackee Alberts, NP    We recommend signing up for the patient portal called MyChart.  Sign up information is provided on this After Visit  Summary.  MyChart is used to connect with patients for Virtual Visits (Telemedicine).  Patients are able to view lab/test results, encounter notes, upcoming appointments, etc.  Non-urgent messages can be sent to your provider as well.   To learn more about what you can do with MyChart, go to [email or web address].   Other Instructions  Recommend getting back to using CPAP

## 2024-01-13 NOTE — Progress Notes (Signed)
  Cardiology Office Note   Date:  01/17/2024  ID:  Sandra Vang, DOB 1976-04-24, MRN [id number] PCP: [place], Betty G, MD  [facility] Providers Cardiologist:  Annabella Scarce, MD     History of Present Illness Sandra Vang is a 48 y.o. female with history of prediabetes, HTN, lupus, FM, OSA, psoriatic arthritis, ADD, depression.   Established 08/2012 for chest pressure with normal stress echo. Re-established in 2022 due to hypertension. ETT 05/2022 negative for ischemia. Coronary CTA 10/17/22 with calcium score 0.   Last seen 12/11/22 with BP at goal <130/80 and spironolactone  50mg  dialy, hydrochlorothiazide  25mg  daily, coreg  3.125mg  BID continued. Leg elevation recommended for LE edema. Encouraged to follow up with PCP regarding hiatal hernia inadvertently noted on CTA.   Presents today for follow up indpendently. 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 She notes feeling her heart beating in her ears bilaterally. [facility] but was told for Wegovy  would need to pay  $750 toward deductibele then $100 per month with insurance which is cost prohibitive. Notes urinating 3-4 times at night and has gotten out of habit of wearing CPAP at night. BP at home 126/80s with HR 70-90s. Enjoys spending time with her 46 month old grandson.   ROS: Please see the history of present illness.    All other systems reviewed and are negative.   Studies Reviewed EKG Interpretation Date/Time:  Tuesday January 13 2024 15:51:24 EDT Ventricular Rate:  79 PR Interval:  160 QRS Duration:  76 QT Interval:  358 QTC Calculation: 410 R Axis:   48  Text Interpretation: Normal sinus rhythm Normal ECG Confirmed by Vannie Mora ([id number]) on 01/13/2024 4:20:20 PM     Risk Assessment/Calculations          Physical Exam VS:  BP 134/82   Pulse 79   Ht 5' 1 (1.549 m)   Wt 191 lb (86.6 kg)   LMP 12/02/2023   BMI 36.09 kg/m        Wt Readings from Last 3 Encounters:   01/13/24 191 lb (86.6 kg)  12/15/23 191 lb 8 oz (86.9 kg)  06/17/23 195 lb (88.5 kg)    GEN: Well nourished, well developed in no acute distress NECK: No JVD; No carotid bruits CARDIAC: RRR, no murmurs, rubs, gallops RESPIRATORY:  Clear to auscultation without rales, wheezing or rhonchi  ABDOMEN: Soft, non-tender, non-distended EXTREMITIES:  No edema; No deformity   ASSESSMENT AND PLAN  HTN - due to increased nocturia will stop hydrochlorothiazide  and increase Coreg  to 6.125mg  BID. Continue spironolactone  50mg  daily. Update BMET in 1-2 weeks for monitoring. Discussed to monitor BP at home at least 2 hours after medications and sitting for 5-10 minutes.   OSA - encouraged to resume using CPAP  Prediabetes / Obesity - Weight loss via diet and exercise encouraged. Discussed the impact being overweight would have on cardiovascular risk. [facility] [employer] but notes wast old would have to pay $750 insurance deductible but could not get clarity as to why. Unusual to pay medical deductible prior to Rx  will route to our PA team to see if they are able to get coverage for Wegovy  for obesity.Encouraged to participate in Right Start exercise program at [facility].        Dispo: follow up in 4-6 months   Signed, Mora GORMAN Vannie, NP

## 2024-01-14 ENCOUNTER — Other Ambulatory Visit (HOSPITAL_COMMUNITY): Payer: Self-pay

## 2024-01-14 ENCOUNTER — Telehealth: Payer: Self-pay | Admitting: Pharmacy Technician

## 2024-01-14 NOTE — Telephone Encounter (Signed)
 Pharmacy Patient Advocate Encounter   Received notification from Fax that prior authorization for Wegovy  0.25MG  is required/requested.   Insurance verification completed.   The patient is insured through [facility] .   Per test claim: PA required; PA submitted to above mentioned insurance via CoverMyMeds Key/confirmation #/EOC  [id number] Status is pending

## 2024-01-14 NOTE — Telephone Encounter (Signed)
 Pharmacy Patient Advocate Encounter  Received notification from [facility] that Prior Authorization for Wegovy  has been APPROVED from 01/14/24 to 08/11/24   PA #/Case ID/Reference #: [id number]

## 2024-01-17 ENCOUNTER — Encounter (HOSPITAL_BASED_OUTPATIENT_CLINIC_OR_DEPARTMENT_OTHER): Payer: Self-pay | Admitting: Family

## 2024-01-19 ENCOUNTER — Other Ambulatory Visit (HOSPITAL_BASED_OUTPATIENT_CLINIC_OR_DEPARTMENT_OTHER): Payer: Self-pay

## 2024-01-19 NOTE — Telephone Encounter (Signed)
 Appreciate assistance of prior auth team. [facility] to inquire on [facility] for Miss Hanif. Cost will be $484.72 for the initial month. They are unable to see subsequent months pricing.   Per prior auth, must also enroll and engage in [facility].   Will update Miss Tacey via MyChart  Reche GORMAN Finder, NP

## 2024-01-21 MED ORDER — WEGOVY 0.25 MG/0.5ML ~~LOC~~ SOAJ: 0.2500 mg | SUBCUTANEOUS | Status: DC

## 2024-01-21 NOTE — Addendum Note (Signed)
 Addended by: FREDIRICK BEAU B on: 01/21/2024 05:19 PM   Modules accepted: Orders

## 2024-02-03 ENCOUNTER — Encounter (HOSPITAL_BASED_OUTPATIENT_CLINIC_OR_DEPARTMENT_OTHER): Payer: Self-pay

## 2024-02-04 LAB — HM MAMMOGRAPHY

## 2024-02-18 ENCOUNTER — Emergency Department (HOSPITAL_COMMUNITY)
Admission: EM | Admit: 2024-02-18 | Discharge: 2024-02-18 | Disposition: A | Discharge status: Home / Self Care | Attending: Emergency Medicine | Admitting: Emergency Medicine

## 2024-02-18 ENCOUNTER — Emergency Department (HOSPITAL_COMMUNITY)

## 2024-02-18 ENCOUNTER — Encounter (HOSPITAL_COMMUNITY): Payer: Self-pay

## 2024-02-18 ENCOUNTER — Other Ambulatory Visit: Payer: Self-pay

## 2024-02-18 DIAGNOSIS — R109 Unspecified abdominal pain: Secondary | ICD-10-CM

## 2024-02-18 DIAGNOSIS — Z79899 Other long term (current) drug therapy: Secondary | ICD-10-CM | POA: Diagnosis not present

## 2024-02-18 DIAGNOSIS — R1013 Epigastric pain: Secondary | ICD-10-CM | POA: Insufficient documentation

## 2024-02-18 DIAGNOSIS — I10 Essential (primary) hypertension: Secondary | ICD-10-CM | POA: Insufficient documentation

## 2024-02-18 LAB — URINALYSIS, ROUTINE W REFLEX MICROSCOPIC
Bilirubin Urine: NEGATIVE
Glucose, UA: NEGATIVE mg/dL
Hgb urine dipstick: NEGATIVE
Ketones, ur: NEGATIVE mg/dL
Leukocytes,Ua: NEGATIVE
Nitrite: NEGATIVE
Protein, ur: NEGATIVE mg/dL
Specific Gravity, Urine: 1.018 (ref 1.005–1.030)
pH: 7 (ref 5.0–8.0)

## 2024-02-18 LAB — COMPREHENSIVE METABOLIC PANEL WITH GFR
ALT: 12 U/L (ref 0–44)
AST: 16 U/L (ref 15–41)
Albumin: 3.8 g/dL (ref 3.5–5.0)
Alkaline Phosphatase: 63 U/L (ref 38–126)
Anion gap: 11 (ref 5–15)
BUN: 13 mg/dL (ref 6–20)
CO2: 22 mmol/L (ref 22–32)
Calcium: 9.2 mg/dL (ref 8.9–10.3)
Chloride: 103 mmol/L (ref 98–111)
Creatinine, Ser: 0.73 mg/dL (ref 0.44–1.00)
GFR, Estimated: >60 mL/min (ref >60)
Glucose, Bld: 95 mg/dL (ref 70–99)
Potassium: 3.8 mmol/L (ref 3.5–5.1)
Sodium: 136 mmol/L (ref 135–145)
Total Bilirubin: 0.6 mg/dL (ref 0.0–1.2)
Total Protein: 7.1 g/dL (ref 6.5–8.1)

## 2024-02-18 LAB — CBC
HCT: 39.6 % (ref 36.0–46.0)
Hemoglobin: 12.5 g/dL (ref 12.0–15.0)
MCH: 27.4 pg (ref 26.0–34.0)
MCHC: 31.6 g/dL (ref 30.0–36.0)
MCV: 86.8 fL (ref 80.0–100.0)
Platelets: 395 K/uL (ref 150–400)
RBC: 4.56 MIL/uL (ref 3.87–5.11)
RDW: 14.4 % (ref 11.5–15.5)
WBC: 8.5 K/uL (ref 4.0–10.5)
nRBC: 0 % (ref 0.0–0.2)

## 2024-02-18 LAB — TROPONIN I (HIGH SENSITIVITY)
Troponin I (High Sensitivity): 4 ng/L (ref <18)
Troponin I (High Sensitivity): 4 ng/L (ref <18)

## 2024-02-18 LAB — HCG, SERUM, QUALITATIVE: Preg, Serum: NEGATIVE

## 2024-02-18 LAB — LIPASE, BLOOD: Lipase: 34 U/L (ref 11–51)

## 2024-02-18 MED ORDER — ACETAMINOPHEN 325 MG PO TABS
975.0000 mg | ORAL_TABLET | Freq: Once | ORAL | Status: AC
  Administered 2024-02-18: 975 mg via ORAL
  Filled 2024-02-18: qty 3

## 2024-02-18 NOTE — ED Provider Notes (Signed)
 [facility] Provider Note   CSN: [id number] Arrival date & time: 02/18/24  9070     Patient presents with: Abdominal Pain   Sandra Vang is a 48 y.o. female.  With a history of pyelonephritis hypertension, lupus, fibromyalgia and obesity on [place]  who presents to the ED for abdominal pain.  Epigastric pain bilateral flank pain began yesterday persisted since the onset.  Pain made worse with taking a deep breath.  No nausea vomiting fevers chills or shortness of breath.  No changes in bowel habits.  Denies urinary symptoms.  She is status post appendectomy and tubal ligation    Abdominal Pain      Prior to Admission medications   Medication Sig Start Date End Date Taking? Authorizing Provider  carvedilol  (COREG ) 6.25 MG tablet Take 1 tablet (6.25 mg total) by mouth 2 (two) times daily with a meal. Patient taking differently: Take 6.25 mg by mouth daily. 01/13/24  Yes Vannie Reche RAMAN, NP  MAGNESIUM GLYCINATE PO Take 2 tablets by mouth at bedtime.   Yes [provider]  OVER THE COUNTER MEDICATION Take 2 capsules by mouth at bedtime. Omega, DHA, vitamin D   Yes [provider]  Semaglutide -Weight Management 0.5 MG/0.5ML SOAJ Inject 0.5 mg into the skin once a week for 28 days. Patient taking differently: Inject 0.5 mg into the skin once a week. Tuesdays 02/11/24 03/10/24 Yes Vannie Reche RAMAN, NP  spironolactone  (ALDACTONE ) 50 MG tablet Take 1 tablet (50 mg total) by mouth daily. 12/30/23  Yes Raford Riggs, MD  hydrochlorothiazide  (HYDRODIURIL ) 25 MG tablet Take 25 mg by mouth daily. Patient not taking: Reported on 02/18/2024    [provider]  Semaglutide -Weight Management 1 MG/0.5ML SOAJ Inject 1 mg into the skin once a week for 28 days. Patient not taking: Reported on 02/18/2024 03/11/24 04/08/24  Vannie Reche RAMAN, NP  Semaglutide -Weight Management 1.7 MG/0.75ML SOAJ Inject 1.7 mg into the skin once a week  for 28 days. Patient not taking: Reported on 02/18/2024 04/09/24 05/07/24  Vannie Reche RAMAN, NP  Semaglutide -Weight Management 2.4 MG/0.75ML SOAJ Inject 2.4 mg into the skin once a week for 28 days. Patient not taking: Reported on 02/18/2024 05/08/24 06/05/24  Vannie Reche RAMAN, NP    Allergies: Iodinated contrast media, Dexamethasone, Iohexol , Sulfa antibiotics, and Sulfasalazine    Review of Systems  Gastrointestinal:  Positive for abdominal pain.    Updated Vital Signs BP (!) 148/87   Pulse 84   Temp 98.3 F (36.8 C) (Oral)   Resp 20   Ht 5' 1 (1.549 m)   Wt 85.3 kg   LMP 01/27/2024 (Approximate)   SpO2 100%   BMI 35.52 kg/m   Physical Exam Vitals and nursing note reviewed.  HENT:     Head: Normocephalic and atraumatic.  Eyes:     Pupils: Pupils are equal, round, and reactive to light.  Cardiovascular:     Rate and Rhythm: Normal rate and regular rhythm.  Pulmonary:     Effort: Pulmonary effort is normal.     Breath sounds: Normal breath sounds.  Abdominal:     Palpations: Abdomen is soft.     Tenderness: There is abdominal tenderness in the epigastric area. There is right CVA tenderness. There is no left CVA tenderness.  Skin:    General: Skin is warm and dry.  Neurological:     Mental Status: She is alert.  Psychiatric:        Mood and  Affect: Mood normal.     (all labs ordered are listed, but only abnormal results are displayed) Labs Reviewed  URINALYSIS, ROUTINE W REFLEX MICROSCOPIC - Abnormal; Notable for the following components:      Result Value   APPearance HAZY (*)    All other components within normal limits  LIPASE, BLOOD  CBC  HCG, SERUM, QUALITATIVE  COMPREHENSIVE METABOLIC PANEL WITH GFR  TROPONIN I (HIGH SENSITIVITY)  TROPONIN I (HIGH SENSITIVITY)    EKG: None  Radiology: CT ABDOMEN PELVIS WO CONTRAST Result Date: 02/18/2024 CLINICAL DATA:  Acute epigastric and bilateral flank pain. EXAM: CT ABDOMEN AND PELVIS WITHOUT CONTRAST  TECHNIQUE: Multidetector CT imaging of the abdomen and pelvis was performed following the standard protocol without IV contrast. RADIATION DOSE REDUCTION: This exam was performed according to the departmental dose-optimization program which includes automated exposure control, adjustment of the mA and/or kV according to patient size and/or use of iterative reconstruction technique. COMPARISON:  None Available. FINDINGS: Lower chest: No acute abnormality. Hepatobiliary: No focal liver abnormality is seen. No gallstones, gallbladder wall thickening, or biliary dilatation. Pancreas: Unremarkable. No pancreatic ductal dilatation or surrounding inflammatory changes. Spleen: Normal in size without focal abnormality. Adrenals/Urinary Tract: Adrenal glands are unremarkable. Kidneys are normal, without renal calculi, focal lesion, or hydronephrosis. Bladder is unremarkable. Stomach/Bowel: Stomach is unremarkable. Status post appendectomy. There is no evidence of bowel obstruction or inflammation. Vascular/Lymphatic: No significant vascular findings are present. No enlarged abdominal or pelvic lymph nodes. Reproductive: Uterus and bilateral adnexa are unremarkable. Other: No ascites or hernia. Musculoskeletal: No acute or significant osseous findings. IMPRESSION: No acute abnormality seen in the abdomen or pelvis. Electronically Signed   By: Lynwood Landy Raddle M.D.   On: 02/18/2024 12:03   DG Chest 2 View Result Date: 02/18/2024 CLINICAL DATA:  Chest and epigastric pain beginning yesterday. Dyspnea. EXAM: CHEST - 2 VIEW COMPARISON:  02/19/2020 FINDINGS: The heart size and mediastinal contours are within normal limits. Both lungs are clear. The visualized skeletal structures are unremarkable. IMPRESSION: No active cardiopulmonary disease. Electronically Signed   By: Norleen DELENA Kil M.D.   On: 02/18/2024 10:37     Procedures   Medications Ordered in the ED  acetaminophen  (TYLENOL ) tablet 975 mg (975 mg Oral Given 02/18/24  1142)    Clinical Course as of 02/18/24 1512  Wed Feb 18, 2024  1511 Laboratory workup CT unremarkable.  Most likely etiology may be a lupus flare.  She will follow-up with her specialist.  Return precaution discussed in detail. [MP]    Clinical Course User Index [MP] Pamella Ozell DELENA, DO                                 Medical Decision Making 48 year old female with history as above presents to the ED for epigastric pain and bilateral flank pain.  No urinary symptoms.  No fevers or chills.  Some epigastric tenderness and left CVA tenderness on my exam.  Afebrile slightly hypertensive.    Differential diagnosis includes: Acute intra-abdominal infectious/inflammatory process such as , pyelonephritis, diverticulitis, pancreatitis and cholecystitis Urinary tract infection Atypical ACS Atypical presentation for pneumonia Viral gastroenteritis  Will obtain laboratory workup including CBC with differential, metabolic panel, lipase and urinalysis along with CT abdomen pelvis (without contrast that she has an allergy) to evaluate for above pathologies  Amount and/or Complexity of Data Reviewed Labs: ordered. Radiology: ordered.  Risk OTC drugs.  Final diagnoses:  Abdominal pain, unspecified abdominal location    ED Discharge Orders     None          Pamella Ozell LABOR, DO 02/18/24 1512

## 2024-02-18 NOTE — ED Notes (Signed)
 CCMD called.

## 2024-02-18 NOTE — ED Notes (Signed)
 Patient transported to CT

## 2024-02-18 NOTE — ED Notes (Signed)
Phlebotomy to collect second troponin

## 2024-02-18 NOTE — ED Triage Notes (Addendum)
 C/O epigastric pain and bilateral flank pain started yesterday. Pt states hard to take a deep breath. Denies n/v/d.  C/O chest pain. Denies SHOB.

## 2024-02-18 NOTE — Discharge Instructions (Addendum)
 You were seen in the emerged part and for abdominal and flank pain The blood work EKG and CAT scan of your abdomen pelvis and chest x-ray all looked okay We are not certain what is causing your pain and It may be related to your lupus or fibromyalgia Follow-up with your specialist Continue taking all previous prescribed occasions Return to the emergency room for severe pain

## 2024-02-20 ENCOUNTER — Telehealth: Payer: Self-pay

## 2024-02-20 ENCOUNTER — Ambulatory Visit: Admitting: Physician Assistant

## 2024-02-20 NOTE — Transitions of Care (Post Inpatient/ED Visit) (Signed)
   02/20/2024  Name: Sandra Vang MRN: [id number] DOB: 06/01/1976  Today's TOC FU Call Status:    Attempted to reach the patient regarding the most recent Inpatient/ED visit.  Follow Up Plan: Additional outreach attempts will be made to reach the patient to complete the Transitions of Care (Post Inpatient/ED visit) call.   Signature : Laia Wiley, CMA

## 2024-03-23 ENCOUNTER — Other Ambulatory Visit (HOSPITAL_BASED_OUTPATIENT_CLINIC_OR_DEPARTMENT_OTHER): Payer: Self-pay | Admitting: Nurse Practitioner

## 2024-04-07 ENCOUNTER — Ambulatory Visit: Admitting: Physician Assistant

## 2024-04-15 ENCOUNTER — Other Ambulatory Visit (HOSPITAL_BASED_OUTPATIENT_CLINIC_OR_DEPARTMENT_OTHER): Payer: Self-pay | Admitting: Nurse Practitioner

## 2024-04-17 ENCOUNTER — Other Ambulatory Visit (HOSPITAL_BASED_OUTPATIENT_CLINIC_OR_DEPARTMENT_OTHER): Payer: Self-pay | Admitting: Family

## 2024-04-17 ENCOUNTER — Encounter (HOSPITAL_BASED_OUTPATIENT_CLINIC_OR_DEPARTMENT_OTHER): Payer: Self-pay

## 2024-04-17 DIAGNOSIS — Z6836 Body mass index (BMI) 36.0-36.9, adult: Secondary | ICD-10-CM

## 2024-04-17 DIAGNOSIS — E66812 Obesity, class 2: Secondary | ICD-10-CM

## 2024-04-19 MED ORDER — SEMAGLUTIDE-WEIGHT MANAGEMENT 1.7 MG/0.75ML ~~LOC~~ SOAJ: 1.7000 mg | SUBCUTANEOUS | 2 refills | Status: AC

## 2024-04-19 NOTE — Telephone Encounter (Signed)
 Okay to fill 1.7 mg dose.   Dwain Huhn S Bertine Schlottman, NP

## 2024-05-16 NOTE — Progress Notes (Deleted)
 Cardiology Office Note:    Date:  05/16/2024   ID:  Sandra Vang, DOB 06/12/1976, MRN [id number]  PCP:  Sandra Vang, Betty G, MD   [facility] Providers Cardiologist:  Sandra Scarce, MD { Click to update primary MD,subspecialty MD or APP then REFRESH:1}    Referring MD: Sandra Vang, Betty G, MD   Chief complaint: ***     History of Present Illness:   Sandra Vang is a 48 y.o. female with a hx of prediabetes, HTN, lupus, FM, OSA, psoriatic arthritis, ADD, depression.   Established 08/2012 for chest pressure with normal stress echo. Re-established in 2022 due to hypertension. ETT 05/2022 negative for ischemia. Coronary CTA 10/17/22 with calcium score 0.   Last seen by Sandra Vang on 01/13/2024, was stable from a cardiac standpoint.  Had reported increased nocturia, HCTZ stopped and Coreg  increased to 6.125 mg twice daily.  Weight loss/exercise program encouraged.  ROS:   Please see the history of present illness.    *** All other systems reviewed and are negative.     Past Medical History:  Diagnosis Date   ADD (attention deficit disorder)    B12 deficiency    Chest pain    Chronic headaches    Depression    Fibromyalgia    Gallbladder disease    Heart murmur    Hypertension    Insomnia    Joint pain    Lactose intolerance    MRSA (methicillin resistant Staphylococcus aureus) infection    OSA (obstructive sleep apnea) 03/24/2015   Psoriatic arthritis (HCC)    Sleep apnea    Systemic lupus erythematosus (HCC) 10/01/2016   -seeing dermatology and rheumatologist (Sandra Vang)   Vitamin D deficiency     Past Surgical History:  Procedure Laterality Date   APPENDECTOMY  2005   CESAREAN SECTION     TUBAL LIGATION  1998    Current Medications: No outpatient medications have been marked as taking for the 05/17/24 encounter (Appointment) with Vang Sandra RAMAN, NP.     Allergies:   Iodinated contrast media, Dexamethasone, Iohexol , Sulfa antibiotics, and  Sulfasalazine   Social History   Socioeconomic History   Marital status: Divorced    Spouse name: Not on file   Number of children: Not on file   Years of education: Not on file   Highest education level: Bachelor's degree (e.Vang., BA, AB, BS)  Occupational History   Occupation: [employer]  Tobacco Use   Smoking status: Never   Smokeless tobacco: Never  Vaping Use   Vaping status: Never Used  Substance and Sexual Activity   Alcohol use: No    Alcohol/week: 0.0 standard drinks of alcohol   Drug use: No   Sexual activity: Not on file  Other Topics Concern   Not on file  Social History Narrative   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, community education officer      Home Situation: lives with her son and daughter      Spiritual Beliefs: Christian      Lifestyle: Curves, some exercise and trying to work on diet      Social Drivers of Health   Financial Resource Strain: Low Risk  (04/21/2023)   Overall Financial Resource Strain (CARDIA)    Difficulty of Paying Living Expenses: Not hard at all  Food Insecurity: No Food Insecurity (04/21/2023)   Hunger Vital Sign    Worried About Running Out of Food in the Last Year: Never true    Ran Out of Food in  the Last Year: Never true  Transportation Needs: No Transportation Needs (04/21/2023)   PRAPARE - Administrator, Civil Service (Medical): No    Lack of Transportation (Non-Medical): No  Physical Activity: Sufficiently Active (04/21/2023)   Exercise Vital Sign    Days of Exercise per Week: 7 days    Minutes of Exercise per Session: 30 min  Stress: Stress Concern Present (04/21/2023)   Sandra Vang    Feeling of Stress : To some extent  Social Connections: Socially Isolated (04/21/2023)   Social Connection and Isolation Panel    Frequency of Communication with Friends and Family: More than three times a week    Frequency of Social Gatherings with Friends and Family: Once a  week    Attends Religious Services: Never    Database Administrator or Organizations: No    Attends Engineer, Structural: Not on file    Marital Status: Divorced     Family History: The patient's ***family history includes Alcohol abuse in her maternal grandfather, maternal grandmother, and mother; Anxiety disorder in her father and mother; Arthritis in her maternal grandmother and paternal grandmother; Asthma in her brother, daughter, daughter, and paternal grandmother; Cancer in her maternal grandmother and paternal grandmother; Colon cancer in her maternal uncle; Depression in her paternal aunt and paternal uncle; Drug abuse in her father; Emphysema in her father; Heart failure in her maternal grandmother and paternal grandmother; High Cholesterol in her father and mother; Hypertension in her father, maternal grandfather, maternal grandmother, mother, paternal grandfather, and paternal grandmother; Kidney disease in her mother; Multiple sclerosis in her brother.  EKGs/Labs/Other Studies Reviewed:    The following studies were reviewed today: ***      Recent Labs: 11/28/2023: TSH 2.160 02/18/2024: ALT 12; BUN 13; Creatinine, Ser 0.73; Hemoglobin 12.5; Platelets 395; Potassium 3.8; Sodium 136  Recent Lipid Panel    Component Value Date/Time   CHOL 187 11/28/2023 1600   TRIG 77 11/28/2023 1600   HDL 60 11/28/2023 1600   CHOLHDL 3.1 11/28/2023 1600   CHOLHDL 3 05/14/2022 0839   VLDL 11.2 05/14/2022 0839   LDLCALC 113 (H) 11/28/2023 1600   LDLCALC 132 (H) 08/10/2021 1626     Risk Assessment/Calculations:   {Does this patient have ATRIAL FIBRILLATION?:[phone]}  No BP recorded.  {Refresh Note OR Click here to enter BP  :1}***         Physical Exam:    VS:  There were no vitals taken for this visit.       Wt Readings from Last 3 Encounters:  02/18/24 188 lb (85.3 kg)  01/13/24 191 lb (86.6 kg)  12/15/23 191 lb 8 oz (86.9 kg)     GEN: *** Well nourished, well  developed in no acute distress HEENT: Normal NECK: No JVD; No carotid bruits CARDIAC: *** S1-S2 normal, RRR, no murmurs, rubs, gallops RESPIRATORY:  Clear to auscultation without rales, wheezing or rhonchi  MUSCULOSKELETAL:  No edema; No deformity  SKIN: Warm and dry NEUROLOGIC:  Alert and oriented x 3 PSYCHIATRIC:  Normal affect       Assessment & Plan        {Are you ordering a CV Procedure (e.Vang. stress test, cath, DCCV, TEE, etc)?   Press F2        :[id number]}    Medication Adjustments/Labs and Tests Ordered: Current medicines are reviewed at length with the patient today.  Concerns regarding medicines are outlined above.  No orders of the defined types were placed in this encounter.  No orders of the defined types were placed in this encounter.   There are no Patient Instructions on file for this visit.   Signed, Miriam FORBES Shams, NP  05/16/2024 8:51 AM    [facility]

## 2024-05-17 ENCOUNTER — Ambulatory Visit (HOSPITAL_BASED_OUTPATIENT_CLINIC_OR_DEPARTMENT_OTHER): Admitting: Emergency Medicine

## 2024-07-15 ENCOUNTER — Telehealth: Admitting: Physician Assistant

## 2024-07-15 ENCOUNTER — Other Ambulatory Visit (HOSPITAL_BASED_OUTPATIENT_CLINIC_OR_DEPARTMENT_OTHER): Payer: Self-pay | Admitting: Family

## 2024-07-15 DIAGNOSIS — M542 Cervicalgia: Secondary | ICD-10-CM

## 2024-07-15 DIAGNOSIS — G44201 Tension-type headache, unspecified, intractable: Secondary | ICD-10-CM | POA: Diagnosis not present

## 2024-07-15 DIAGNOSIS — I10 Essential (primary) hypertension: Secondary | ICD-10-CM

## 2024-07-15 MED ORDER — CYCLOBENZAPRINE HCL 10 MG PO TABS: 5.0000 mg | ORAL_TABLET | Freq: Three times a day (TID) | ORAL | 0 refills | Status: AC | PRN

## 2024-07-15 MED ORDER — PREDNISONE 10 MG (21) PO TBPK: ORAL_TABLET | ORAL | 0 refills | Status: DC

## 2024-07-15 NOTE — Progress Notes (Signed)
 " Virtual Visit Consent   Sandra Vang, you are scheduled for a virtual visit with a [place] provider today. Just as with appointments in the office, your consent must be obtained to participate. Your consent will be active for this visit and any virtual visit you may have with one of our providers in the next 365 days. If you have a MyChart account, a copy of this consent can be sent to you electronically.  As this is a virtual visit, video technology does not allow for your provider to perform a traditional examination. This may limit your provider's ability to fully assess your condition. If your provider identifies any concerns that need to be evaluated in person or the need to arrange testing (such as labs, EKG, etc.), we will make arrangements to do so. Although advances in technology are sophisticated, we cannot ensure that it will always work on either your end or our end. If the connection with a video visit is poor, the visit may have to be switched to a telephone visit. With either a video or telephone visit, we are not always able to ensure that we have a secure connection.  By engaging in this virtual visit, you consent to the provision of healthcare and authorize for your insurance to be billed (if applicable) for the services provided during this visit. Depending on your insurance coverage, you may receive a charge related to this service.  I need to obtain your verbal consent now. Are you willing to proceed with your visit today? Sandra Vang has provided verbal consent on 07/15/2024 for a virtual visit (video or telephone). Delon CHRISTELLA Dickinson, PA-C  Date: 07/15/2024 1:15 PM   Virtual Visit via Video Note   I, Delon CHRISTELLA Dickinson, connected with  Sandra Vang  ([id number], 12-Oct-1975) on 07/15/24 at 12:15 PM EST by a video-enabled telemedicine application and verified that I am speaking with the correct person using two identifiers.  Location: Patient: Virtual Visit  Location Patient: Home Provider: Virtual Visit Location Provider: Home Office   I discussed the limitations of evaluation and management by telemedicine and the availability of in person appointments. The patient expressed understanding and agreed to proceed.    History of Present Illness: Sandra Vang is a 49 y.o. who identifies as a female who was assigned female at birth, and is being seen today for neck pain and headache.  HPI: Headache  This is a new problem. The current episode started yesterday. The problem occurs constantly. The problem has been unchanged. The pain is located in the Left unilateral and occipital region. The pain radiates to the left neck and left shoulder. The pain quality is not similar to prior headaches. The quality of the pain is described as aching, squeezing, sharp and stabbing. The pain is severe. Associated symptoms include nausea, scalp tenderness, sinus pressure (left frontal) and weakness (fatigue, tiredness). Pertinent negatives include no abdominal pain, blurred vision, coughing, dizziness, ear pain, eye pain, eye redness, eye watering, fever, hearing loss, numbness, phonophobia, photophobia, tingling, visual change or vomiting. The symptoms are aggravated by work, weather changes, menstrual cycle and activity. She has tried acetaminophen  and Excedrin (Zofran) for the symptoms. The treatment provided mild relief. Her past medical history is significant for migraine headaches.   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  Resting comfortably at home.  Head is normocephalic, atraumatic.  No labored breathing.  Speech is clear and coherent with logical content.  Patient is alert and oriented at baseline.    Assessment and Plan: 1. Neck pain (Primary) - predniSONE  (STERAPRED UNI-PAK 21 TAB) 10 MG (21) TBPK tablet; 6 day taper; take as directed on package instructions  Dispense: 21 tablet; Refill: 0 - cyclobenzaprine  (FLEXERIL ) 10 MG tablet; Take 0.5-1 tablets (5-10 mg total) by mouth 3 (three) times daily as needed.  Dispense: 30 tablet; Refill: 0  2. Acute intractable tension-type headache - predniSONE  (STERAPRED UNI-PAK 21 TAB) 10 MG (21) TBPK tablet; 6 day taper; take as directed on package instructions  Dispense: 21 tablet; Refill: 0 - cyclobenzaprine  (FLEXERIL ) 10 MG tablet; Take 0.5-1 tablets (5-10 mg total) by mouth 3 (three) times daily as needed.  Dispense: 30 tablet; Refill: 0  - Suspect possible tension-type headache triggering migraine - Will add Prednisone  taper and muscle relaxer  - Heat to neck - Light stretches - May need to follow up with PCP in near future if continues; does have migraine history that required preventative and abortive therapy and trigger point injections, had been followed by Neurology, but that was in her 30s - If not improving  in next 24 hours or if worsens she is to seek immediate medical evaluation at the closest ER  Follow Up Instructions: I discussed the assessment and treatment plan with the patient. The patient was provided an opportunity to ask questions and all were answered. The patient agreed with the plan and demonstrated an understanding of the instructions.  A copy of instructions were sent to the patient via MyChart unless otherwise noted below.    The patient was advised to call back or seek an in-person evaluation if the symptoms worsen or if the condition fails to improve as anticipated.    Delon CHRISTELLA Dickinson, PA-C     [1]  Allergies Allergen Reactions   Iodinated Contrast Media Anaphylaxis   Dexamethasone Other (See Comments)    Severe vaginal and rectal burning   Iohexol  Hives   Sulfa Antibiotics Hives   Sulfasalazine Hives  [2]  Current Outpatient Medications:    cyclobenzaprine  (FLEXERIL ) 10 MG tablet, Take 0.5-1 tablets (5-10 mg total) by mouth 3 (three) times daily as needed., Disp: 30 tablet, Rfl: 0   predniSONE  (STERAPRED UNI-PAK 21 TAB) 10 MG (21) TBPK tablet, 6 day taper; take as directed on package instructions, Disp: 21 tablet, Rfl: 0   carvedilol  (COREG ) 6.25 MG tablet, Take 1 tablet (6.25 mg total) by mouth 2 (two) times daily with a meal. (Patient taking differently: Take 6.25 mg by mouth daily.), Disp: 180 tablet, Rfl: 1  hydrochlorothiazide  (HYDRODIURIL ) 25 MG tablet, TAKE 1 TABLET (25 MG TOTAL) BY MOUTH DAILY., Disp: 90 tablet, Rfl: 0   MAGNESIUM GLYCINATE PO, Take 2 tablets by mouth at bedtime., Disp: , Rfl:    OVER THE COUNTER MEDICATION, Take 2 capsules by mouth at bedtime. Omega, DHA, vitamin D, Disp: , Rfl:    spironolactone  (ALDACTONE ) 50 MG tablet, Take 1 tablet (50 mg total) by mouth daily., Disp: 90 tablet, Rfl: 0  "

## 2024-07-15 NOTE — Patient Instructions (Signed)
 " Sandra Vang, thank you for joining Delon CHRISTELLA Dickinson, PA-C for today's virtual visit.  While this provider is not your primary care provider (PCP), if your PCP is located in our provider database this encounter information will be shared with them immediately following your visit.   A [place] MyChart account gives you access to today's visit and all your visits, tests, and labs performed at [facility]  click here if you don't have a [facility] MyChart account or go to [email or web address]  Consent: (Patient) Sandra Vang provided verbal consent for this virtual visit at the beginning of the encounter.  Current Medications:  Current Outpatient Medications:    cyclobenzaprine  (FLEXERIL ) 10 MG tablet, Take 0.5-1 tablets (5-10 mg total) by mouth 3 (three) times daily as needed., Disp: 30 tablet, Rfl: 0   predniSONE  (STERAPRED UNI-PAK 21 TAB) 10 MG (21) TBPK tablet, 6 day taper; take as directed on package instructions, Disp: 21 tablet, Rfl: 0   carvedilol  (COREG ) 6.25 MG tablet, Take 1 tablet (6.25 mg total) by mouth 2 (two) times daily with a meal. (Patient taking differently: Take 6.25 mg by mouth daily.), Disp: 180 tablet, Rfl: 1   hydrochlorothiazide  (HYDRODIURIL ) 25 MG tablet, TAKE 1 TABLET (25 MG TOTAL) BY MOUTH DAILY., Disp: 90 tablet, Rfl: 0   MAGNESIUM GLYCINATE PO, Take 2 tablets by mouth at bedtime., Disp: , Rfl:    OVER THE COUNTER MEDICATION, Take 2 capsules by mouth at bedtime. Omega, DHA, vitamin D, Disp: , Rfl:    spironolactone  (ALDACTONE ) 50 MG tablet, Take 1 tablet (50 mg total) by mouth daily., Disp: 90 tablet, Rfl: 0   Medications ordered in this encounter:  Meds ordered this encounter  Medications   predniSONE  (STERAPRED UNI-PAK 21 TAB) 10 MG (21) TBPK tablet    Sig: 6 day taper; take as directed on package instructions    Dispense:  21 tablet    Refill:  0    Supervising Provider:   LAMPTEY, PHILIP O [[id number]]   cyclobenzaprine   (FLEXERIL ) 10 MG tablet    Sig: Take 0.5-1 tablets (5-10 mg total) by mouth 3 (three) times daily as needed.    Dispense:  30 tablet    Refill:  0    Supervising Provider:   LAMPTEY, PHILIP O [[id number]]     *If you need refills on other medications prior to your next appointment, please contact your pharmacy*  Follow-Up: Call back or seek an in-person evaluation if the symptoms worsen or if the condition fails to improve as anticipated.  [facility] [phone]  Other Instructions  Neck Exercises Ask your health care provider which exercises are safe for you. Do exercises exactly as told by your health care provider and adjust them as directed. It is normal to feel mild stretching, pulling, tightness, or discomfort as you do these exercises. Stop right away if you feel sudden pain or your pain gets worse. Do not begin these exercises until told by your health care provider. Neck exercises can be important for many reasons. They can improve strength and maintain flexibility in your neck, which will help your upper back and prevent neck pain. Stretching exercises Rotation neck stretching  Sit in a chair or stand up. Place your feet flat on the floor, shoulder-width apart. Slowly turn your head (rotate) to the right until a slight stretch is felt. Turn it all the way to the right so you can look over your right shoulder. Do not tilt or tip  your head. Hold this position for 10-30 seconds. Slowly turn your head (rotate) to the left until a slight stretch is felt. Turn it all the way to the left so you can look over your left shoulder. Do not tilt or tip your head. Hold this position for 10-30 seconds. Repeat __________ times. Complete this exercise __________ times a day. Neck retraction  Sit in a sturdy chair or stand up. Look straight ahead. Do not bend your neck. Use your fingers to push your chin backward (retraction). Do not bend your neck for this movement. Continue  to face straight ahead. If you are doing the exercise properly, you will feel a slight sensation in your throat and a stretch at the back of your neck. Hold the stretch for 1-2 seconds. Repeat __________ times. Complete this exercise __________ times a day. Strengthening exercises Neck press  Lie on your back on a firm bed or on the floor with a pillow under your head. Use your neck muscles to push your head down on the pillow and straighten your spine. Hold the position as well as you can. Keep your head facing up (in a neutral position) and your chin tucked. Slowly count to 5 while holding this position. Repeat __________ times. Complete this exercise __________ times a day. Isometrics These are exercises in which you strengthen the muscles in your neck while keeping your neck still (isometrics). Sit in a supportive chair and place your hand on your forehead. Keep your head and face facing straight ahead. Do not flex or extend your neck while doing isometrics. Push forward with your head and neck while pushing back with your hand. Hold for 10 seconds. Do the sequence again, this time putting your hand against the back of your head. Use your head and neck to push backward against the hand pressure. Finally, do the same exercise on either side of your head, pushing sideways against the pressure of your hand. Repeat __________ times. Complete this exercise __________ times a day. Prone head lifts  Lie face-down (prone position), resting on your elbows so that your chest and upper back are raised. Start with your head facing downward, near your chest. Position your chin either on or near your chest. Slowly lift your head upward. Lift until you are looking straight ahead. Then continue lifting your head as far back as you can comfortably stretch. Hold your head up for 5 seconds. Then slowly lower it to your starting position. Repeat __________ times. Complete this exercise __________ times a  day. Supine head lifts  Lie on your back (supine position), bending your knees to point to the ceiling and keeping your feet flat on the floor. Lift your head slowly off the floor, raising your chin toward your chest. Hold for 5 seconds. Repeat __________ times. Complete this exercise __________ times a day. Scapular retraction  Stand with your arms at your sides. Look straight ahead. Slowly pull both shoulders (scapulae) backward and downward (retraction) until you feel a stretch between your shoulder blades in your upper back. Hold for 10-30 seconds. Relax and repeat. Repeat __________ times. Complete this exercise __________ times a day. Contact a health care provider if: Your neck pain or discomfort gets worse when you do an exercise. Your neck pain or discomfort does not improve within 2 hours after you exercise. If you have any of these problems, stop exercising right away. Do not do the exercises again unless your health care provider says that you can. Get help right  away if: You develop sudden, severe neck pain. If this happens, stop exercising right away. Do not do the exercises again unless your health care provider says that you can. This information is not intended to replace advice given to you by your health care provider. Make sure you discuss any questions you have with your health care provider. Document Revised: 12/12/2020 Document Reviewed: 12/12/2020 Elsevier Patient Education  2024 [employer]  Tension Headache, Adult A tension headache is a feeling of pain, pressure, or aching over the front and sides of the head. The pain can be dull, or it can feel tight. There are two types of tension headache: Episodic tension headache. This is when the headaches happen fewer than 15 days a month. Chronic tension headache. This is when the headaches happen more than 15 days a month during a 42-month period. A tension headache can last from 30 minutes to several days. It is  the most common kind of headache. Tension headaches are not normally associated with nausea or vomiting, and they do not get worse with physical activity. What are the causes? The exact cause of this condition is not known. Tension headaches are often triggered by stress, anxiety, or depression. Other triggers may include: Alcohol. Too much caffeine or caffeine withdrawal. Respiratory infections, such as colds, flu, or sinus infections. Dental problems or teeth clenching. Fatigue. Holding your head and neck in the same position for a long period of time, such as while using a computer. Smoking. Arthritis of the neck. What are the signs or symptoms? Symptoms of this condition include: A feeling of pressure or tightness around the head. Dull, aching head pain. Pain over the front and sides of the head. Tenderness in the muscles of the head, neck, and shoulders. How is this diagnosed? This condition may be diagnosed based on your symptoms, your medical history, and a physical exam. If your symptoms are severe or unusual, you may have imaging tests, such as a CT scan or an MRI of your head. Your vision may also be checked. How is this treated? This condition may be treated with lifestyle changes and with medicines that help relieve symptoms. Follow these instructions at home: Managing pain Take over-the-counter and prescription medicines only as told by your health care provider. When you have a headache, lie down in a dark, quiet room. If directed, put ice on your head and neck. To do this: Put ice in a plastic bag. Place a towel between your skin and the bag. Leave the ice on for 20 minutes, 2-3 times a day. Remove the ice if your skin turns bright red. This is very important. If you cannot feel pain, heat, or cold, you have a greater risk of damage to the area. If directed, apply heat to the back of your neck as often as told by your health care provider. Use the heat source that your  health care provider recommends, such as a moist heat pack or a heating pad. Place a towel between your skin and the heat source. Leave the heat on for 20-30 minutes. Remove the heat if your skin turns bright red. This is especially important if you are unable to feel pain, heat, or cold. You have a greater risk of getting burned. Eating and drinking Eat meals on a regular schedule. If you drink alcohol: Limit how much you have to: 0-1 drink a day for women who are not pregnant. 0-2 drinks a day for men. Know how much alcohol is  in your drink. In the [place] one drink equals one 12 oz bottle of beer (355 mL), one 5 oz glass of wine (148 mL), or one 1 oz glass of hard liquor (44 mL). Drink enough fluid to keep your urine pale yellow. Decrease your caffeine intake, or stop using caffeine. Lifestyle Get 7-9 hours of sleep each night, or get the amount of sleep recommended by your health care provider. At bedtime, remove computers, phones, and tablets from your room. Find ways to manage your stress. This may include: Exercise. Deep breathing exercises. Yoga. Listening to music. Positive mental imagery. Try to sit up straight and avoid tensing your muscles. Do not use any products that contain nicotine or tobacco. These include cigarettes, chewing tobacco, and vaping devices, such as e-cigarettes. If you need help quitting, ask your health care provider. General instructions  Avoid any headache triggers. Keep a journal to help find out what may trigger your headaches. For example, write down: What you eat and drink. How much sleep you get. Any change to your diet or medicines. Keep all follow-up visits. This is important. Contact a health care provider if: Your headache does not get better. Your headache comes back. You are sensitive to sounds, light, or smells because of a headache. You have nausea or you vomit. Your stomach hurts. Get help right away if: You suddenly develop a  severe headache, along with any of the following: A stiff neck. Nausea and vomiting. Confusion. Weakness in one part or one side of your body. Double vision or loss of vision. Shortness of breath. Rash. Unusual sleepiness. Fever or chills. Trouble speaking. Pain in your eye or ear. Trouble walking or balancing. Feeling faint or passing out. Summary A tension headache is a feeling of pain, pressure, or aching over the front and sides of the head. A tension headache can last from 30 minutes to several days. It is the most common kind of headache. This condition may be diagnosed based on your symptoms, your medical history, and a physical exam. This condition may be treated with lifestyle changes and with medicines that help relieve symptoms. This information is not intended to replace advice given to you by your health care provider. Make sure you discuss any questions you have with your health care provider. Document Revised: 03/14/2023 Document Reviewed: 03/14/2023 Elsevier Patient Education  2024 [employer]   If you have been instructed to have an in-person evaluation today at a local Urgent Care facility, please use the link below. It will take you to a list of all of our available [facility], including address, phone number and hours of operation. Please do not delay care.  [facility]  If you or a family member do not have a primary care provider, use the link below to schedule a visit and establish care. When you choose a [facility] primary care physician or advanced practice provider, you gain a long-term partner in health. Find a Primary Care Provider  Learn more about [facility]'s in-office and virtual care options: [facility] - Get Care Now "

## 2024-07-17 ENCOUNTER — Encounter: Payer: Self-pay | Admitting: Family Medicine

## 2024-07-17 ENCOUNTER — Encounter (HOSPITAL_BASED_OUTPATIENT_CLINIC_OR_DEPARTMENT_OTHER): Payer: Self-pay

## 2024-07-17 DIAGNOSIS — Z0189 Encounter for other specified special examinations: Secondary | ICD-10-CM

## 2024-07-17 DIAGNOSIS — E042 Nontoxic multinodular goiter: Secondary | ICD-10-CM

## 2024-07-20 ENCOUNTER — Telehealth: Admitting: Family Medicine

## 2024-07-20 ENCOUNTER — Encounter: Payer: Self-pay | Admitting: Family Medicine

## 2024-07-20 VITALS — Ht 61.0 in

## 2024-07-20 DIAGNOSIS — G4733 Obstructive sleep apnea (adult) (pediatric): Secondary | ICD-10-CM

## 2024-07-20 DIAGNOSIS — M542 Cervicalgia: Secondary | ICD-10-CM

## 2024-07-20 DIAGNOSIS — R519 Headache, unspecified: Secondary | ICD-10-CM

## 2024-07-20 DIAGNOSIS — I10 Essential (primary) hypertension: Secondary | ICD-10-CM

## 2024-07-20 NOTE — Assessment & Plan Note (Signed)
 She has not been consistent with wearing CPAP. Working on wt loss. We discussed possible complications if not well controlled, including headaches and HTN.

## 2024-07-20 NOTE — Assessment & Plan Note (Signed)
 BP is not adequately controlled. Recommend increasing dose of Carvedilol  from 6.25 mg bid to 12.5 mg bid. Continue low salt diet. Continue monitoring BP regularly. Keep next appt with her cardiologist next month.

## 2024-07-20 NOTE — Progress Notes (Signed)
 Virtual Visit via Video Note I connected with Sandra Vang on 07/20/24 by a video enabled telemedicine application and verified that I am speaking with the correct person using two identifiers. Location patient: home Location provider:work office Persons participating in the virtual visit: patient, provider  I discussed the limitations of evaluation and management by telemedicine and the availability of in person appointments. The patient expressed understanding and agreed to proceed.  Chief Complaint  Patient presents with   Migraine    X 2 weeks with neck pain x 1 week    Discussed the use of AI scribe software for clinical note transcription with the patient, who gave verbal consent to proceed. History of Present Illness Sandra Vang is a 49 year old female with PMHx significant for HTN, OSA, migraine, discoid lupus erythematosus, fibromyalgia, and prediabetes who presents with a two-week history of increased migraine frequency and neck pain. She was last seen on 12/15/23 for ehr CPE.  For the past two weeks, she has experienced migraines with pain primarily on the left side of her head, extending from the occipital to the frontal region. The pain is described as dull, sometimes sharp, and pressure-like, with a severity of 7 out of 10. Associated symptoms include nausea and fatigue. No fever, chills, visual changes,photophobia, vomiting, numbness,of focal weakness. The frequency of her migraines has increased from approximately four times a month to daily occurrences over the past two weeks.  She reports neck pain for the past week, located on the  left side, described as pressure. The neck pain sometimes radiates to her shoulders and is associated with dizziness and an electrical sensation around her mouth, the latter one she has had intermittently for long time and stable. No associated dysphagia or stridor. Neck pain is not radiated to UE's, no extremities numbness or  tingling.  She has been taking Excedrin for her headaches. Video visit on 07/15/24 , she was prescribed prednisone  and Flexeril  for severe neck pain and headache. Prednisone  has helped with the soreness, but she avoids taking Flexeril  due to its sedative effects.  Dx'ed with migraines in her twenties. Hypertension with recent blood pressure readings ranging from 129/87 to 162/100. She is currently taking carvedilol  6.25 mg twice daily Her cardiologist discontinued hydrochlorothiazide  and spironolactone . + Fatigue. Negative for CP,SOB,and palpitations. She reports HR >=80-90's She takes Adderall for ADHD. She is mindful of her fluid intake, consuming four to five bottles of water daily.  OSA: She does not use her CPAP machine regularly. She is trying to manage her weight and stress.  Her family history includes neurofibromatosis in her cousin and uncle. She would like a referral to neuro.   ROS: See pertinent positives and negatives per HPI.  Past Medical History:  Diagnosis Date   ADD (attention deficit disorder)    B12 deficiency    Chest pain    Chronic headaches    Depression    Fibromyalgia    Gallbladder disease    Heart murmur    Hypertension    Insomnia    Joint pain    Lactose intolerance    MRSA (methicillin resistant Staphylococcus aureus) infection    OSA (obstructive sleep apnea) 03/24/2015   Psoriatic arthritis (HCC)    Sleep apnea    Systemic lupus erythematosus (HCC) 10/01/2016   -seeing dermatology and rheumatologist (Dr. Leni)   Vitamin D deficiency     Past Surgical History:  Procedure Laterality Date   APPENDECTOMY  2005   CESAREAN SECTION  TUBAL LIGATION  1998    Family History  Problem Relation Age of Onset   Alcohol abuse Mother    Hypertension Mother    High Cholesterol Mother    Kidney disease Mother    Anxiety disorder Mother    Drug abuse Father    Hypertension Father    Emphysema Father    High Cholesterol Father    Anxiety  disorder Father    Asthma Brother    Multiple sclerosis Brother    Colon cancer Maternal Uncle    Depression Paternal Aunt    Depression Paternal Uncle    Heart failure Maternal Grandmother    Alcohol abuse Maternal Grandmother    Arthritis Maternal Grandmother    Cancer Maternal Grandmother    Hypertension Maternal Grandmother    Alcohol abuse Maternal Grandfather    Hypertension Maternal Grandfather    Heart failure Paternal Grandmother    Arthritis Paternal Grandmother    Cancer Paternal Grandmother    Hypertension Paternal Grandmother    Asthma Paternal Grandmother    Hypertension Paternal Grandfather    Asthma Daughter    Asthma Daughter     Social History   Socioeconomic History   Marital status: Divorced    Spouse name: Not on file   Number of children: Not on file   Years of education: Not on file   Highest education level: Bachelor's degree (e.g., BA, AB, BS)  Occupational History   Occupation: [employer]  Tobacco Use   Smoking status: Never   Smokeless tobacco: Never  Vaping Use   Vaping status: Never Used  Substance and Sexual Activity   Alcohol use: No    Alcohol/week: 0.0 standard drinks of alcohol   Drug use: No   Sexual activity: Not on file  Other Topics Concern   Not on file  Social History Narrative   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, community education officer      Home Situation: lives with her son and daughter      Spiritual Beliefs: Christian      Lifestyle: Curves, some exercise and trying to work on diet      Social Drivers of Health   Tobacco Use: Low Risk (07/20/2024)   Patient History    Smoking Tobacco Use: Never    Smokeless Tobacco Use: Never    Passive Exposure: Not on file  Financial Resource Strain: Low Risk (07/20/2024)   Overall Financial Resource Strain (CARDIA)    Difficulty of Paying Living Expenses: Not very hard  Food Insecurity: No Food Insecurity (07/20/2024)   Epic    Worried About Radiation Protection Practitioner of Food in the Last Year: Never true     Ran Out of Food in the Last Year: Never true  Transportation Needs: No Transportation Needs (07/20/2024)   Epic    Lack of Transportation (Medical): No    Lack of Transportation (Non-Medical): No  Physical Activity: Inactive (07/20/2024)   Exercise Vital Sign    Days of Exercise per Week: 0 days    Minutes of Exercise per Session: Not on file  Stress: Stress Concern Present (07/20/2024)   [employer] of Occupational Health - Occupational Stress Questionnaire    Feeling of Stress: Rather much  Social Connections: Socially Isolated (07/20/2024)   Social Connection and Isolation Panel    Frequency of Communication with Friends and Family: Three times a week    Frequency of Social Gatherings with Friends and Family: Once a week    Attends Religious Services: Never  Active Member of Clubs or Organizations: No    Attends Banker Meetings: Not on file    Marital Status: Divorced  Intimate Partner Violence: Unknown (10/05/2021)   Received from [facility]   HITS    Physically Hurt: Not on file    Insult or Talk Down To: Not on file    Threaten Physical Harm: Not on file    Scream or Curse: Not on file  Depression (PHQ2-9): Low Risk (07/20/2024)   Depression (PHQ2-9)    PHQ-2 Score: 0  Alcohol Screen: Not on file  Housing: High Risk (07/20/2024)   Epic    Unable to Pay for Housing in the Last Year: Yes    Number of Times Moved in the Last Year: 0    Homeless in the Last Year: No  Utilities: Not on file  Health Literacy: Not on file    Current Medications[1]  EXAM:  VITALS per patient if applicable:Ht 5' 1 (1.549 m)   BMI 35.52 kg/m   GENERAL: alert, oriented, appears well and in no acute distress  HEENT: atraumatic, conjunctiva clear, no obvious abnormalities on inspection of external nose and ears  NECK: normal movements of the head and neck  LUNGS: on inspection no signs of respiratory distress, breathing rate appears normal, no obvious gross SOB,  gasping or wheezing  CV: no obvious cyanosis  MS: moves all visible extremities without noticeable abnormality. Left-sided neck pain with movement.  PSYCH/NEURO: pleasant and cooperative, no obvious depression or anxiety, speech and thought processing grossly intact  ASSESSMENT AND PLAN:  Discussed the following assessment and plan:  Headache, unspecified headache type Tension headache vs migraine. We discussed treatment options. Has tried Amitriptyline, caused drowsiness the following day. She prefers to hold on medications like Nortriptyline or Topamax. Neuro referral placed as requested. Instructed about warning signs. PT for neck pain may help.  -     Ambulatory referral to Neurology -     Ambulatory referral to Physical Therapy  Neck pain PT will be arranged. Recommend Flexeril  5-10 mg at bedtime as needed. I do not think imaging is needed.  -     Ambulatory referral to Physical Therapy  Essential hypertension Assessment & Plan: BP is not adequately controlled. Recommend increasing dose of Carvedilol  from 6.25 mg bid to 12.5 mg bid. Continue low salt diet. Continue monitoring BP regularly. Keep next appt with her cardiologist next month.   OSA (obstructive sleep apnea) Assessment & Plan: She has not been consistent with wearing CPAP. Working on wt loss. We discussed possible complications if not well controlled, including headaches and HTN.  We discussed possible serious and likely etiologies, options for evaluation and workup, limitations of telemedicine visit vs in person visit, treatment, treatment risks and precautions. The patient was advised to call back or seek an in-person evaluation if the symptoms worsen or if the condition fails to improve as anticipated. I discussed the assessment and treatment plan with the patient. The patient was provided an opportunity to ask questions and all were answered. The patient agreed with the plan and demonstrated an  understanding of the instructions.  Return if symptoms worsen or fail to improve, for will follow with neuro.  Nicolas Banh, MD      [1]  Current Outpatient Medications:    carvedilol  (COREG ) 6.25 MG tablet, TAKE 1 TABLET BY MOUTH 2 TIMES DAILY WITH A MEAL., Disp: 180 tablet, Rfl: 2   cyclobenzaprine  (FLEXERIL ) 10 MG tablet, Take 0.5-1 tablets (5-10 mg  total) by mouth 3 (three) times daily as needed., Disp: 30 tablet, Rfl: 0   MAGNESIUM GLYCINATE PO, Take 2 tablets by mouth at bedtime., Disp: , Rfl:    OVER THE COUNTER MEDICATION, Take 2 capsules by mouth at bedtime. Omega, DHA, vitamin D, Disp: , Rfl:    predniSONE  (STERAPRED UNI-PAK 21 TAB) 10 MG (21) TBPK tablet, 6 day taper; take as directed on package instructions, Disp: 21 tablet, Rfl: 0

## 2024-07-21 ENCOUNTER — Encounter (HOSPITAL_BASED_OUTPATIENT_CLINIC_OR_DEPARTMENT_OTHER): Payer: Self-pay

## 2024-07-21 DIAGNOSIS — E785 Hyperlipidemia, unspecified: Secondary | ICD-10-CM

## 2024-07-21 DIAGNOSIS — R7303 Prediabetes: Secondary | ICD-10-CM

## 2024-07-22 ENCOUNTER — Encounter: Payer: Self-pay | Admitting: Neurology

## 2024-07-22 NOTE — Telephone Encounter (Signed)
 Please order fasting lipid panel, A1c, CMP to be collected prior to OV. Lipid/CMP for HLD and A1c for prediabetes. TY!  Shameek Nyquist S Nallely Yost, NP

## 2024-07-23 NOTE — Addendum Note (Signed)
 Addended by: VANICE LUCIENNE PARAS on: 07/23/2024 07:58 AM   Modules accepted: Orders

## 2024-07-29 ENCOUNTER — Ambulatory Visit: Admitting: Physical Therapy

## 2024-07-29 ENCOUNTER — Encounter: Payer: Self-pay | Admitting: Family Medicine

## 2024-07-29 DIAGNOSIS — Z77120 Contact with and (suspected) exposure to mold (toxic): Secondary | ICD-10-CM

## 2024-07-30 ENCOUNTER — Encounter (HOSPITAL_BASED_OUTPATIENT_CLINIC_OR_DEPARTMENT_OTHER): Payer: Self-pay | Admitting: Family

## 2024-07-30 ENCOUNTER — Ambulatory Visit

## 2024-07-30 ENCOUNTER — Ambulatory Visit (HOSPITAL_BASED_OUTPATIENT_CLINIC_OR_DEPARTMENT_OTHER)
Admission: RE | Admit: 2024-07-30 | Discharge: 2024-07-30 | Disposition: A | Source: Ambulatory Visit | Attending: Family Medicine | Admitting: Family Medicine

## 2024-07-30 ENCOUNTER — Ambulatory Visit (INDEPENDENT_AMBULATORY_CARE_PROVIDER_SITE_OTHER): Admitting: Family

## 2024-07-30 ENCOUNTER — Encounter (HOSPITAL_BASED_OUTPATIENT_CLINIC_OR_DEPARTMENT_OTHER): Payer: Self-pay

## 2024-07-30 VITALS — BP 124/82 | HR 76 | Ht 61.0 in | Wt 175.9 lb

## 2024-07-30 DIAGNOSIS — R002 Palpitations: Secondary | ICD-10-CM | POA: Insufficient documentation

## 2024-07-30 DIAGNOSIS — R7303 Prediabetes: Secondary | ICD-10-CM

## 2024-07-30 DIAGNOSIS — Z136 Encounter for screening for cardiovascular disorders: Secondary | ICD-10-CM | POA: Diagnosis not present

## 2024-07-30 DIAGNOSIS — Z0189 Encounter for other specified special examinations: Secondary | ICD-10-CM | POA: Insufficient documentation

## 2024-07-30 DIAGNOSIS — Z13220 Encounter for screening for lipoid disorders: Secondary | ICD-10-CM

## 2024-07-30 DIAGNOSIS — E042 Nontoxic multinodular goiter: Secondary | ICD-10-CM | POA: Diagnosis present

## 2024-07-30 DIAGNOSIS — I059 Rheumatic mitral valve disease, unspecified: Secondary | ICD-10-CM

## 2024-07-30 DIAGNOSIS — I349 Nonrheumatic mitral valve disorder, unspecified: Secondary | ICD-10-CM | POA: Diagnosis not present

## 2024-07-30 MED ORDER — WEGOVY 2.4 MG/0.75ML ~~LOC~~ SOAJ: 2.4000 mg | SUBCUTANEOUS | 2 refills | Status: AC

## 2024-07-30 NOTE — Progress Notes (Unsigned)
Enrolled patient for a 14 day Zio XT monitor to be mailed to patients home  [place] to read

## 2024-07-30 NOTE — Progress Notes (Signed)
 " Cardiology Office Note   Date:  07/30/2024  ID:  Sandra Vang, DOB [birth year], MRN [id number] PCP: Jordan, Betty G, MD  [facility] Providers Cardiologist:  Annabella Scarce, MD     History of Present Illness Sandra Vang is a 49 y.o. female with history of prediabetes, HTN, lupus, FM, OSA, psoriatic arthritis, ADD, depression.   Established 08/2012 for chest pressure with normal stress echo. Re-established in 2022 due to hypertension. ETT 05/2022 negative for ischemia. Coronary CTA 10/17/22 with calcium score 0.   Seen 12/11/22 with BP at goal <130/80 and spironolactone  50mg  dialy, hydrochlorothiazide  25mg  daily, coreg  3.125mg  BID continued. Leg elevation recommended for LE edema. Encouraged to follow up with PCP regarding hiatal hernia inadvertently noted on CTA.   At visit 01/13/24 due to nocturia hczt stopped and carvedilol  increased to 6.125mg  BID for BP control. Spironolactone  50mg  daily continued. Encouraged to resume using CPAP. She was started on Wegovy  and enrolled in [facility] weight management program per her insurance requirements.   Presents today for follow up. Works in community education officer and enjoys spending time with 78 month old grandson. Weight loss of 16 lbs over 6 months on Wegovy  demonstrating 8.37% decrease in weight. Notes some headaches. She skipped dose of Wegovy , minimal improvement. She also had accupuncture session. Encouraged to hydrate well to prevent headache. She limits caffeine to 1 cup per day. Following with primary care regarding headache, known history of migraine. She has been taking two of her Carvedilol  6.25mg  tablets twice per day to help with BP and tachycardia at direction of primary care starting 10 days ago. Of note, Adderal was changed to Guanfacine but has not yet started Guanfacine. Reports persistent palpitations with HR up to 105 bpm at rest..   ROS: Please see the history of present illness.    All other systems reviewed and are negative.    Studies Reviewed      Cardiac Studies & Procedures   ______________________________________________________________________________________________   STRESS TESTS  EXERCISE TOLERANCE TEST (ETT) 06/04/2022  Interpretation Summary   No ST deviation was noted.  Negative adequate stress test without evidence of ischemia at given workload.   ECHOCARDIOGRAM  ECHOCARDIOGRAM COMPLETE 12/25/2021  Narrative ECHOCARDIOGRAM REPORT    Patient Name:   Sandra Vang Date of Exam: 12/25/2021 Medical Rec #:  [id number]        Height:       61.0 in Accession #:    [id number]       Weight:       190.0 lb Date of Birth:  23-Mar-1976         BSA:          1.848 m Patient Age:    46 years         BP:           165/96 mmHg Patient Gender: F                HR:           75 bpm. Exam Location:  [address]  Procedure: 2D Echo, 3D Echo, Cardiac Doppler, Color Doppler and Strain Analysis  Indications:    R06.09 Dyspnea on exertion  History:        Patient has no prior history of Echocardiogram examinations. Risk Factors:Hypertension and Sleep Apnea. Obese. Lupus.  Sonographer:    Marshia Lawyer BS, RDCS Referring Phys: [id number] Mamie Diiorio S Brie Eppard  IMPRESSIONS   1. Left ventricular ejection fraction, by estimation, is 65 to 70%. The left  ventricle has normal function. The left ventricle has no regional wall motion abnormalities. There is mild asymmetric left ventricular hypertrophy of the septal segment. Left ventricular diastolic parameters were normal. The average left ventricular global longitudinal strain is -27.4 %. The global longitudinal strain is normal. 2. Right ventricular systolic function is normal. The right ventricular size is normal. There is normal pulmonary artery systolic pressure. The estimated right ventricular systolic pressure is 28.0 mmHg. 3. The mitral valve is normal in structure. Trivial mitral valve regurgitation. No evidence of mitral stenosis. 4. The aortic valve is  tricuspid. Aortic valve regurgitation is not visualized. No aortic stenosis is present. 5. The inferior vena cava is normal in size with greater than 50% respiratory variability, suggesting right atrial pressure of 3 mmHg. 6. Increased flow velocities may be secondary to anemia, thyrotoxicosis, hyperdynamic or high flow state.  FINDINGS Left Ventricle: Left ventricular ejection fraction, by estimation, is 65 to 70%. The left ventricle has normal function. The left ventricle has no regional wall motion abnormalities. The average left ventricular global longitudinal strain is -27.4 %. The global longitudinal strain is normal. 3D left ventricular ejection fraction analysis performed but not reported based on interpreter judgement due to suboptimal tracking. The left ventricular internal cavity size was normal in size. There is mild asymmetric left ventricular hypertrophy of the septal segment. Left ventricular diastolic parameters were normal.  Right Ventricle: The right ventricular size is normal. No increase in right ventricular wall thickness. Right ventricular systolic function is normal. There is normal pulmonary artery systolic pressure. The tricuspid regurgitant velocity is 2.50 m/s, and with an assumed right atrial pressure of 3 mmHg, the estimated right ventricular systolic pressure is 28.0 mmHg.  Left Atrium: Left atrial size was normal in size.  Right Atrium: Right atrial size was normal in size.  Pericardium: There is no evidence of pericardial effusion.  Mitral Valve: The mitral valve is normal in structure. Trivial mitral valve regurgitation. No evidence of mitral valve stenosis.  Tricuspid Valve: The tricuspid valve is normal in structure. Tricuspid valve regurgitation is mild . No evidence of tricuspid stenosis.  Aortic Valve: The aortic valve is tricuspid. Aortic valve regurgitation is not visualized. No aortic stenosis is present.  Pulmonic Valve: The pulmonic valve was normal  in structure. Pulmonic valve regurgitation is trivial. No evidence of pulmonic stenosis.  Aorta: The aortic root is normal in size and structure.  Venous: The inferior vena cava is normal in size with greater than 50% respiratory variability, suggesting right atrial pressure of 3 mmHg.  IAS/Shunts: The atrial septum is grossly normal.   LEFT VENTRICLE PLAX 2D LVIDd:         4.00 cm   Diastology LVIDs:         2.50 cm   LV e' medial:    9.14 cm/s LV PW:         1.10 cm   LV E/e' medial:  10.4 LV IVS:        1.20 cm   LV e' lateral:   22.00 cm/s LVOT diam:     2.20 cm   LV E/e' lateral: 4.3 LV SV:         102 LV SV Index:   55        2D Longitudinal Strain LVOT Area:     3.80 cm  2D Strain GLS (A2C):   -31.1 % 2D Strain GLS (A3C):   -22.7 % 2D Strain GLS (A4C):   -28.5 % 2D Strain  GLS Avg:     -27.4 %  3D Volume EF: 3D EF:        59 % LV EDV:       127 ml LV ESV:       52 ml LV SV:        74 ml  RIGHT VENTRICLE             IVC RV Basal diam:  3.70 cm     IVC diam: 1.30 cm RV S prime:     12.80 cm/s TAPSE (M-mode): 2.3 cm RVSP:           28.0 mmHg  LEFT ATRIUM             Index        RIGHT ATRIUM           Index LA diam:        3.60 cm 1.95 cm/m   RA Pressure: 3.00 mmHg LA Vol (A2C):   48.9 ml 26.46 ml/m  RA Area:     11.60 cm LA Vol (A4C):   52.8 ml 28.57 ml/m  RA Volume:   28.80 ml  15.58 ml/m LA Biplane Vol: 51.3 ml 27.76 ml/m AORTIC VALVE LVOT Vmax:   129.00 cm/s LVOT Vmean:  91.200 cm/s LVOT VTI:    0.268 m  AORTA Ao Root diam: 2.90 cm Ao Asc diam:  2.90 cm  MITRAL VALVE               TRICUSPID VALVE TR Peak grad:   25.0 mmHg MV Decel Time: 144 msec    TR Vmax:        250.00 cm/s MV E velocity: 95.40 cm/s  Estimated RAP:  3.00 mmHg MV A velocity: 57.10 cm/s  RVSP:           28.0 mmHg MV E/A ratio:  1.67 SHUNTS Systemic VTI:  0.27 m Systemic Diam: 2.20 cm  Soyla Merck MD Electronically signed by Soyla Merck MD Signature Date/Time:  12/25/2021/4:25:57 PM    Final    MONITORS  LONG TERM MONITOR (3-14 DAYS) 09/13/2021  Narrative 4 Day Zio Monitor  Quality: Fair.  Baseline artifact. Predominant rhythm: sinus rhythm Average heart rate: 83 bpm Max heart rate: 137 bpm Min heart rate: 54 bpm Pauses >2.5 seconds: none  Rare PACs and PVCs Ventricular triplet  Tiffany C. Raford, MD, [facility] 10/14/2021 4:36 PM   CT SCANS  CT CORONARY MORPH W/CTA COR W/SCORE 10/14/2022  Addendum 10/17/2022 11:39 AM ADDENDUM REPORT: 10/17/2022 11:36  EXAM: OVER-READ INTERPRETATION  CT CHEST  The following report is an over-read performed by radiologist Dr. Oneil Devonshire of [facility] on 10/17/2022. This over-read does not include interpretation of cardiac or coronary anatomy or pathology. The coronary calcium score/coronary CTA interpretation by the cardiologist is attached.  COMPARISON:  None.  FINDINGS: Cardiovascular: There are no significant extracardiac vascular findings. No pulmonary emboli are seen.  Mediastinum/Nodes: There are no enlarged lymph nodes within the visualized mediastinum.Small sliding-type hiatal hernia is noted.  Lungs/Pleura: There is no pleural effusion. The visualized lungs appear clear.  Upper abdomen: No significant findings in the visualized upper abdomen.  Musculoskeletal/Chest wall: No chest wall mass or suspicious osseous findings within the visualized chest.  IMPRESSION: Sliding-type hiatal hernia.  No other significant extracardiac findings are noted.   Electronically Signed By: Oneil Devonshire M.D. On: 10/17/2022 11:36  Narrative HISTORY: Chest pain, nonspecific chest pain, dizziness  EXAM: Cardiac/Coronary CT  TECHNIQUE: The patient  was scanned on a [employer].  PROTOCOL: A 90 kV prospective scan was triggered in the descending thoracic aorta at 111 HU's. Axial non-contrast 3 mm slices were carried out through the heart. The data set was  analyzed on a dedicated work station and scored using the Agatston method. Gantry rotation speed was 250 msecs and collimation was 0.6 mm. Heart rate was optimized medically and sl NTG was given. The 3D data set was reconstructed in 5% intervals of the 35-75 % of the R-R cycle. Systolic and diastolic phases were analyzed on a dedicated work station using MPR, MIP and VRT modes. The patient received OMNIPAQUE  IOHEXOL  350 MG/ML SOLN of contrast.  FINDINGS: Coronary calcium score: The patient's coronary artery calcium score is 0, which places the patient in the 0 percentile.  Coronary arteries: Normal coronary origins.  Right dominance.  Right Coronary Artery: Normal caliber vessel, gives rise to PDA. No significant plaque or stenosis.  Left Main Coronary Artery: Normal caliber vessel. No significant plaque or stenosis.  Left Anterior Descending Coronary Artery: Normal caliber vessel. No significant plaque or stenosis. Gives rise to one large diagonal branch without significant plaque or stenosis.  Left Circumflex Artery: Normal caliber vessel. No significant plaque or stenosis in visualized vessel, but there is a section of proximal LCx that is not well seen due to slab artifact. Gives rise to two small OM branches.  Aorta: Normal size, 29 mm at the mid ascending aorta (level of the PA bifurcation) measured double oblique. No aortic atherosclerosis. No dissection seen in visualized portions of the aorta.  Aortic Valve: No calcifications. Trileaflet.  Other findings:  Normal pulmonary vein drainage into the left atrium.  Normal left atrial appendage without a thrombus.  Normal size of the pulmonary artery.  Normal appearance of the pericardium.  Significant slab/motion artifact.  IMPRESSION: 1. No evidence of CAD, CADRADS = 0.  2. Coronary calcium score of 0. This was 0 percentile for age-, sex-, and race- matched controls.  3. Significant slab/motion  artifact.  INTERPRETATION:  CAD-RADS 0: No evidence of CAD (0%). Consider non-atherosclerotic causes of chest pain.  Electronically Signed: By: Shelda Bruckner M.D. On: 10/14/2022 15:30     ______________________________________________________________________________________________      Risk Assessment/Calculations          Physical Exam VS:  BP 124/82 (BP Location: Left Arm, Patient Position: Sitting, Cuff Size: Normal)   Pulse 76   Ht 5' 1 (1.549 m)   Wt 175 lb 14.4 oz (79.8 kg)   SpO2 98%   BMI 33.24 kg/m        Wt Readings from Last 3 Encounters:  07/30/24 175 lb 14.4 oz (79.8 kg)  02/18/24 188 lb (85.3 kg)  01/13/24 191 lb (86.6 kg)    GEN: Well nourished, well developed in no acute distress NECK: No JVD; No carotid bruits CARDIAC: RRR, no murmurs, rubs, gallops RESPIRATORY:  Clear to auscultation without rales, wheezing or rhonchi  ABDOMEN: Soft, non-tender, non-distended EXTREMITIES:  No edema; No deformity   ASSESSMENT AND PLAN  HTN - Hydrochlorothiazide  previously stopped due to nocturia. Spironolactone  stopped, unclear why. BP in in clinic reasonably controlled on Carvedilol  12.5mg  BID (taking two 6.25mg  tablets BID) - increased 07/20/24 at direction of PCP for palpitations and elevated BP.  Check BP daily x 1 week. MyChart message in 1 week to check in on BP control.   OSA - Previously encouraged to resume using CPAP  Palpitations - RRR on ausculatation.  Some improvement with increased dose carvedilol , not resolved. Plan for 14 day ZIo and echocardiogram. Labs next week: TSH, BMP, CBC to rule out thyroid  abnormality, anemia, electrolyte imbalance.   Cardiovascular risk factor - lipid panel with labs next week.  Prediabetes / Obesity - Weight loss via diet and exercise encouraged. Discussed the impact being overweight would have on cardiovascular risk.Presently on Wegovy  2.4 mg weekly, refills provided. Has weight loss of 16 lbs over 6 months on  Wegovy  demonstrating 8.37% decrease in weight. Not yet exercising routinely due to demands caring for grandchildren, considering Right Start program at [facility]. Update A1c with labs next week.     Dispo: follow up in 6 mos - if significant abnormality on monitor/echo can schedule sooner follow up   Signed, Reche GORMAN Finder, NP   "

## 2024-07-30 NOTE — Patient Instructions (Addendum)
 Medication Instructions:  Continue your current medications  *If you need a refill on your cardiac medications before your next appointment, please call your pharmacy*  Labs: Your physician recommends that you return for lab work one day next week for fasting labs  Testing/Procedures: Your physician has requested that you have an echocardiogram. Echocardiography is a painless test that uses sound waves to create images of your heart. It provides your doctor with information about the size and shape of your heart and how well your hearts chambers and valves are working. This procedure takes approximately one hour. There are no restrictions for this procedure. Please do NOT wear cologne, perfume, aftershave, or lotions (deodorant is allowed). Please arrive 15 minutes prior to your appointment time.  Please note: We ask at that you not bring children with you during ultrasound (echo/ vascular) testing. Due to room size and safety concerns, children are not allowed in the ultrasound rooms during exams. Our front office staff cannot provide observation of children in our lobby area while testing is being conducted. An adult accompanying a patient to their appointment will only be allowed in the ultrasound room at the discretion of the ultrasound technician under special circumstances. We apologize for any inconvenience.   ____________________________  Your physician has recommended that you wear a Zio monitor.   This monitor is a medical device that records the hearts electrical activity. Doctors most often use these monitors to diagnose arrhythmias. Arrhythmias are problems with the speed or rhythm of the heartbeat. The monitor is a small device applied to your chest. You can wear one while you do your normal daily activities. While wearing this monitor if you have any symptoms to push the button and record what you felt. Once you have worn this monitor for the period of time provider prescribed  (Usually 14 days), you will return the monitor device in the postage paid box. Once it is returned they will download the data collected and provide us  with a report which the provider will then review and we will call you with those results. Important tips:  Avoid showering during the first 24 hours of wearing the monitor. Avoid excessive sweating to help maximize wear time. Do not submerge the device, no hot tubs, and no swimming pools. Keep any lotions or oils away from the patch. After 24 hours you may shower with the patch on. Take brief showers with your back facing the shower head.  Do not remove patch once it has been placed because that will interrupt data and decrease adhesive wear time. Push the button when you have any symptoms and write down what you were feeling. Once you have completed wearing your monitor, remove and place into box which has postage paid and place in your outgoing mailbox.  If for some reason you have misplaced your box then call our office and we can provide another box and/or mail it off for you.     Follow-Up: At [facility], you and your health needs are our priority.  As part of our continuing mission to provide you with exceptional heart care, our providers are all part of one team.  This team includes your primary Cardiologist (physician) and Advanced Practice Providers or APPs (Physician Assistants and Nurse Practitioners) who all work together to provide you with the care you need, when you need it.  Your next appointment:   6 month(s)  Provider:   Annabella Scarce, MD, Rosaline Bane, NP, or Reche Finder, NP  We recommend signing up for the patient portal called MyChart.  Sign up information is provided on this After Visit Summary.  MyChart is used to connect with patients for Virtual Visits (Telemedicine).  Patients are able to view lab/test results, encounter notes, upcoming appointments, etc.  Non-urgent messages can be sent to  your provider as well.   To learn more about what you can do with MyChart, go to [email or web address].   Other Instructions  To prevent palpitations: Make sure you are adequately hydrated.  Avoid and/or limit caffeine containing beverages like soda or tea. Exercise regularly.  Manage stress well. Some over the counter medications can cause palpitations such as Benadryl , AdvilPM, TylenolPM. Regular Advil  or Tylenol  do not cause palpitations.

## 2024-08-06 ENCOUNTER — Ambulatory Visit: Payer: Self-pay | Admitting: Family Medicine

## 2024-08-06 ENCOUNTER — Encounter (HOSPITAL_BASED_OUTPATIENT_CLINIC_OR_DEPARTMENT_OTHER): Payer: Self-pay

## 2024-08-06 ENCOUNTER — Other Ambulatory Visit (HOSPITAL_BASED_OUTPATIENT_CLINIC_OR_DEPARTMENT_OTHER): Payer: Self-pay | Admitting: Family

## 2024-08-06 NOTE — Telephone Encounter (Signed)
 Can you please check on status of prior auth? Not sure if just new prior auth required with new year.   Nakya Weyand S Brianny Soulliere, NP

## 2024-09-02 ENCOUNTER — Other Ambulatory Visit (HOSPITAL_BASED_OUTPATIENT_CLINIC_OR_DEPARTMENT_OTHER)

## 2024-09-08 ENCOUNTER — Ambulatory Visit: Payer: Self-pay | Admitting: Neurology
